# Patient Record
Sex: Female | Born: 1960
Health system: Southern US, Community
[De-identification: ages and names within clinical notes are randomized; demographics above are authoritative.]

## PROBLEM LIST (undated history)

## (undated) DIAGNOSIS — E7800 Pure hypercholesterolemia, unspecified: Secondary | ICD-10-CM

## (undated) DIAGNOSIS — I10 Essential (primary) hypertension: Secondary | ICD-10-CM

## (undated) DIAGNOSIS — E119 Type 2 diabetes mellitus without complications: Secondary | ICD-10-CM

## (undated) DIAGNOSIS — K219 Gastro-esophageal reflux disease without esophagitis: Secondary | ICD-10-CM

## (undated) HISTORY — DX: Type 2 diabetes mellitus without complications: E11.9

---

## 2001-10-31 ENCOUNTER — Emergency Department (HOSPITAL_COMMUNITY): Admission: EM | Admit: 2001-10-31 | Discharge: 2001-10-31 | Payer: Self-pay

## 2011-04-17 LAB — HM MAMMOGRAPHY: HM MAMMO: NORMAL (ref 0–4)

## 2012-03-05 DIAGNOSIS — K5732 Diverticulitis of large intestine without perforation or abscess without bleeding: Secondary | ICD-10-CM

## 2012-03-05 HISTORY — DX: Diverticulitis of large intestine without perforation or abscess without bleeding: K57.32

## 2012-07-08 HISTORY — PX: COLONOSCOPY: SHX174

## 2013-04-16 LAB — HM PAP SMEAR

## 2013-04-16 LAB — HM COLONOSCOPY

## 2013-06-23 DIAGNOSIS — E7800 Pure hypercholesterolemia, unspecified: Secondary | ICD-10-CM

## 2013-06-23 HISTORY — DX: Pure hypercholesterolemia, unspecified: E78.00

## 2014-05-05 ENCOUNTER — Encounter (HOSPITAL_BASED_OUTPATIENT_CLINIC_OR_DEPARTMENT_OTHER): Payer: Self-pay | Admitting: Emergency Medicine

## 2014-05-05 ENCOUNTER — Emergency Department (HOSPITAL_BASED_OUTPATIENT_CLINIC_OR_DEPARTMENT_OTHER): Payer: 59

## 2014-05-05 ENCOUNTER — Emergency Department (HOSPITAL_BASED_OUTPATIENT_CLINIC_OR_DEPARTMENT_OTHER)
Admission: EM | Admit: 2014-05-05 | Discharge: 2014-05-05 | Disposition: A | Discharge status: Home / Self Care | Payer: 59 | Attending: Emergency Medicine | Admitting: Emergency Medicine

## 2014-05-05 DIAGNOSIS — Z8719 Personal history of other diseases of the digestive system: Secondary | ICD-10-CM | POA: Insufficient documentation

## 2014-05-05 DIAGNOSIS — Z720 Tobacco use: Secondary | ICD-10-CM | POA: Insufficient documentation

## 2014-05-05 DIAGNOSIS — I10 Essential (primary) hypertension: Secondary | ICD-10-CM | POA: Insufficient documentation

## 2014-05-05 DIAGNOSIS — Z8639 Personal history of other endocrine, nutritional and metabolic disease: Secondary | ICD-10-CM | POA: Insufficient documentation

## 2014-05-05 DIAGNOSIS — R61 Generalized hyperhidrosis: Secondary | ICD-10-CM | POA: Insufficient documentation

## 2014-05-05 DIAGNOSIS — N200 Calculus of kidney: Secondary | ICD-10-CM | POA: Insufficient documentation

## 2014-05-05 DIAGNOSIS — R1032 Left lower quadrant pain: Secondary | ICD-10-CM | POA: Diagnosis present

## 2014-05-05 DIAGNOSIS — R109 Unspecified abdominal pain: Secondary | ICD-10-CM

## 2014-05-05 HISTORY — DX: Gastro-esophageal reflux disease without esophagitis: K21.9

## 2014-05-05 HISTORY — DX: Essential (primary) hypertension: I10

## 2014-05-05 HISTORY — DX: Pure hypercholesterolemia, unspecified: E78.00

## 2014-05-05 LAB — BASIC METABOLIC PANEL
Anion gap: 15 (ref 5–15)
BUN: 11 mg/dL (ref 6–23)
CHLORIDE: 105 meq/L (ref 96–112)
CO2: 25 mEq/L (ref 19–32)
CREATININE: 1.1 mg/dL (ref 0.50–1.10)
Calcium: 9.4 mg/dL (ref 8.4–10.5)
GFR calc non Af Amer: 56 mL/min — ABNORMAL LOW (ref >90)
GFR, EST AFRICAN AMERICAN: 65 mL/min — AB (ref >90)
Glucose, Bld: 115 mg/dL — ABNORMAL HIGH (ref 70–99)
Potassium: 3.5 mEq/L — ABNORMAL LOW (ref 3.7–5.3)
Sodium: 145 mEq/L (ref 137–147)

## 2014-05-05 LAB — CBC WITH DIFFERENTIAL/PLATELET
Basophils Absolute: 0.1 10*3/uL (ref 0.0–0.1)
Basophils Relative: 1 % (ref 0–1)
EOS PCT: 0 % (ref 0–5)
Eosinophils Absolute: 0 10*3/uL (ref 0.0–0.7)
HCT: 40.8 % (ref 36.0–46.0)
Hemoglobin: 13.4 g/dL (ref 12.0–15.0)
LYMPHS ABS: 6.9 10*3/uL — AB (ref 0.7–4.0)
Lymphocytes Relative: 70 % — ABNORMAL HIGH (ref 12–46)
MCH: 31.5 pg (ref 26.0–34.0)
MCHC: 32.8 g/dL (ref 30.0–36.0)
MCV: 96 fL (ref 78.0–100.0)
Monocytes Absolute: 0.3 10*3/uL (ref 0.1–1.0)
Monocytes Relative: 3 % (ref 3–12)
Neutro Abs: 2.6 10*3/uL (ref 1.7–7.7)
Neutrophils Relative %: 26 % — ABNORMAL LOW (ref 43–77)
PLATELETS: 240 10*3/uL (ref 150–400)
RBC: 4.25 MIL/uL (ref 3.87–5.11)
RDW: 13.1 % (ref 11.5–15.5)
WBC: 9.9 10*3/uL (ref 4.0–10.5)

## 2014-05-05 MED ORDER — ONDANSETRON HCL 4 MG/2ML IJ SOLN
4.0000 mg | Freq: Once | INTRAMUSCULAR | Status: AC
  Administered 2014-05-05: 4 mg via INTRAVENOUS

## 2014-05-05 MED ORDER — ONDANSETRON HCL 4 MG/2ML IJ SOLN
INTRAMUSCULAR | Status: AC
  Filled 2014-05-05: qty 2

## 2014-05-05 MED ORDER — MORPHINE SULFATE 4 MG/ML IJ SOLN
4.0000 mg | Freq: Once | INTRAMUSCULAR | Status: AC
  Administered 2014-05-05: 4 mg via INTRAVENOUS
  Filled 2014-05-05: qty 1

## 2014-05-05 MED ORDER — KETOROLAC TROMETHAMINE 30 MG/ML IJ SOLN
30.0000 mg | Freq: Once | INTRAMUSCULAR | Status: AC
  Administered 2014-05-05: 30 mg via INTRAVENOUS
  Filled 2014-05-05: qty 1

## 2014-05-05 MED ORDER — OXYCODONE-ACETAMINOPHEN 5-325 MG PO TABS: 1.0000 | ORAL_TABLET | Freq: Four times a day (QID) | ORAL | Status: DC | PRN

## 2014-05-05 NOTE — ED Notes (Signed)
Sudden onset left abd pain with nausea, sweating- vomiting during triage

## 2014-05-05 NOTE — ED Notes (Signed)
Pt actively vomiting-unable to complete entire triage info at this time

## 2014-05-05 NOTE — Discharge Instructions (Signed)
Percocet as prescribed as needed for pain.  Strain your urine.  Follow-up with urology if you have not past the stone in the next 2-3 days, and return to the ER if you develop fever, worsening pain, or vomiting with an inability to take your medications.   Kidney Stones Kidney stones (urolithiasis) are deposits that form inside your kidneys. The intense pain is caused by the stone moving through the urinary tract. When the stone moves, the ureter goes into spasm around the stone. The stone is usually passed in the urine.  CAUSES   A disorder that makes certain neck glands produce too much parathyroid hormone (primary hyperparathyroidism).  A buildup of uric acid crystals, similar to gout in your joints.  Narrowing (stricture) of the ureter.  A kidney obstruction present at birth (congenital obstruction).  Previous surgery on the kidney or ureters.  Numerous kidney infections. SYMPTOMS   Feeling sick to your stomach (nauseous).  Throwing up (vomiting).  Blood in the urine (hematuria).  Pain that usually spreads (radiates) to the groin.  Frequency or urgency of urination. DIAGNOSIS   Taking a history and physical exam.  Blood or urine tests.  CT scan.  Occasionally, an examination of the inside of the urinary bladder (cystoscopy) is performed. TREATMENT   Observation.  Increasing your fluid intake.  Extracorporeal shock wave lithotripsy--This is a noninvasive procedure that uses shock waves to break up kidney stones.  Surgery may be needed if you have severe pain or persistent obstruction. There are various surgical procedures. Most of the procedures are performed with the use of small instruments. Only small incisions are needed to accommodate these instruments, so recovery time is minimized. The size, location, and chemical composition are all important variables that will determine the proper choice of action for you. Talk to your health care provider to better  understand your situation so that you will minimize the risk of injury to yourself and your kidney.  HOME CARE INSTRUCTIONS   Drink enough water and fluids to keep your urine clear or pale yellow. This will help you to pass the stone or stone fragments.  Strain all urine through the provided strainer. Keep all particulate matter and stones for your health care provider to see. The stone causing the pain may be as small as a grain of salt. It is very important to use the strainer each and every time you pass your urine. The collection of your stone will allow your health care provider to analyze it and verify that a stone has actually passed. The stone analysis will often identify what you can do to reduce the incidence of recurrences.  Only take over-the-counter or prescription medicines for pain, discomfort, or fever as directed by your health care provider.  Make a follow-up appointment with your health care provider as directed.  Get follow-up X-rays if required. The absence of pain does not always mean that the stone has passed. It may have only stopped moving. If the urine remains completely obstructed, it can cause loss of kidney function or even complete destruction of the kidney. It is your responsibility to make sure X-rays and follow-ups are completed. Ultrasounds of the kidney can show blockages and the status of the kidney. Ultrasounds are not associated with any radiation and can be performed easily in a matter of minutes. SEEK MEDICAL CARE IF:  You experience pain that is progressive and unresponsive to any pain medicine you have been prescribed. SEEK IMMEDIATE MEDICAL CARE IF:   Pain  cannot be controlled with the prescribed medicine.  You have a fever or shaking chills.  The severity or intensity of pain increases over 18 hours and is not relieved by pain medicine.  You develop a new onset of abdominal pain.  You feel faint or pass out.  You are unable to urinate. MAKE SURE  YOU:   Understand these instructions.  Will watch your condition.  Will get help right away if you are not doing well or get worse. Document Released: 06/24/2005 Document Revised: 02/24/2013 Document Reviewed: 11/25/2012 [facility] Patient Information 2015 [employer] [place]. This information is not intended to replace advice given to you by your health care provider. Make sure you discuss any questions you have with your health care provider.

## 2014-05-05 NOTE — ED Notes (Signed)
Patient transported to CT 

## 2014-05-05 NOTE — ED Notes (Signed)
MD at bedside. 

## 2014-05-05 NOTE — ED Provider Notes (Signed)
CSN: [id number]     Arrival date & time 05/05/14  1225 History   First MD Initiated Contact with Patient 05/05/14 1252     Chief Complaint  Patient presents with  . Abdominal Pain     (Consider location/radiation/quality/duration/timing/severity/associated sxs/prior Treatment) HPI Comments: Patient is a 53 year old female otherwise healthy who presents with complaints of sudden onset of left flank pain. This started approximately 3 hours prior to presentation. Her pain radiates into her left flank and is associated with nausea and vomiting. She denies any history of prior kidney stones. Her only prior surgical history is C-section many years ago.  Patient is a 53 y.o. female presenting with abdominal pain. The history is provided by the patient.  Abdominal Pain Pain location:  L flank and LLQ Pain quality: stabbing   Pain radiates to:  Does not radiate Pain severity:  Severe Onset quality:  Sudden Duration:  2 hours Timing:  Constant Progression:  Worsening Chronicity:  New Context: not sick contacts and not trauma   Relieved by:  Nothing Worsened by:  Nothing tried Ineffective treatments:  Acetaminophen Associated symptoms: nausea and vomiting     Past Medical History  Diagnosis Date  . Hypertension   . High cholesterol   . Acid reflux    Past Surgical History  Procedure Laterality Date  . Cesarean section     No family history on file. History  Substance Use Topics  . Smoking status: Current Every Day Smoker    Types: Cigarettes  . Smokeless tobacco: Not on file  . Alcohol Use: Not on file   OB History   Grav Para Term Preterm Abortions TAB SAB Ect Mult Living                 Review of Systems  Gastrointestinal: Positive for nausea, vomiting and abdominal pain.  All other systems reviewed and are negative.     Allergies  Review of patient's allergies indicates no known allergies.  Home Medications   Prior to Admission medications   Not on File    BP 188/110  Pulse 88  Temp(Src) 97.8 F (36.6 C) (Oral)  Resp 22  Ht 5\' 7"  (1.702 m)  Wt 180 lb (81.647 kg)  BMI 28.19 kg/m2  SpO2 100% Physical Exam  Nursing note and vitals reviewed. Constitutional: She is oriented to person, place, and time. She appears well-developed and well-nourished. No distress.  Patient appears somewhat diaphoretic and uncomfortable.  HENT:  Head: Normocephalic and atraumatic.  Neck: Normal range of motion. Neck supple.  Cardiovascular: Normal rate and regular rhythm.  Exam reveals no gallop and no friction rub.   No murmur heard. Pulmonary/Chest: Effort normal and breath sounds normal. No respiratory distress. She has no wheezes.  Abdominal: Soft. Bowel sounds are normal. She exhibits no distension. There is tenderness. There is no rebound and no guarding.  There is tenderness to palpation in the left lower quadrant and left flank.  Musculoskeletal: Normal range of motion.  Neurological: She is alert and oriented to person, place, and time.  Skin: Skin is warm. She is diaphoretic.    ED Course  Procedures (including critical care time) Labs Review Labs Reviewed - No data to display  Imaging Review No results found.   EKG Interpretation None      MDM   Final diagnoses:  Left flank pain    Patient presents with acute onset of severe left flank pain. CT scan confirms a 3.5 mm stone within the mid ureter.  She is feeling better with pain meds given in the ER. She will be discharged with Percocet and follow-up with urology if not passing the stone in the next 2-3 days.    Geoffery Lyonsouglas Kimmberly Wisser, MD 05/05/14 1426

## 2015-01-24 LAB — HEPATIC FUNCTION PANEL
ALT: 11 U/L (ref 7–35)
AST: 15 U/L (ref 13–35)
Alkaline Phosphatase: 74 U/L (ref 25–125)
BILIRUBIN, TOTAL: 0.6 mg/dL

## 2015-01-24 LAB — LIPID PANEL
Cholesterol: 198 mg/dL (ref 0–200)
HDL: 44 mg/dL (ref 35–70)
LDL Cholesterol: 125 mg/dL
Triglycerides: 143 mg/dL (ref 40–160)

## 2015-01-24 LAB — BASIC METABOLIC PANEL
BUN: 12 mg/dL (ref 4–21)
CREATININE: 1 mg/dL (ref 0.5–1.1)
GLUCOSE: 93 mg/dL
POTASSIUM: 4.3 mmol/L (ref 3.4–5.3)
SODIUM: 141 mmol/L (ref 137–147)

## 2015-08-02 LAB — LIPID PANEL
CHOLESTEROL: 196 mg/dL (ref 0–200)
HDL: 45 mg/dL (ref 35–70)
TRIGLYCERIDES: 175 mg/dL — AB (ref 40–160)

## 2015-09-08 LAB — BASIC METABOLIC PANEL
BUN: 10 mg/dL (ref 4–21)
Creatinine: 1 mg/dL (ref 0.5–1.1)
Glucose: 96 mg/dL
Potassium: 4 mmol/L (ref 3.4–5.3)
Sodium: 141 mmol/L (ref 137–147)

## 2016-02-15 ENCOUNTER — Telehealth: Payer: Self-pay

## 2016-02-15 NOTE — Telephone Encounter (Signed)
Pt called in because she says that she was admitted into the hospital. She says that she was discharged yesterday. Pt is scheduled to establish care with provider on 9/13. Pt would like to be advise, is provider able to see her sooner or should she complete Hospital FU with old provider ?   Please advise.

## 2016-02-15 NOTE — Telephone Encounter (Signed)
Scheduled pt for Monday 02/19/16 at 1:15 p.

## 2016-02-15 NOTE — Telephone Encounter (Signed)
Probably should be seen in the next 1 week. Could you please try to get her in sooner with Dr. Dallas Schimkeopeland?

## 2016-02-19 ENCOUNTER — Ambulatory Visit (INDEPENDENT_AMBULATORY_CARE_PROVIDER_SITE_OTHER): Payer: 59 | Admitting: Family Medicine

## 2016-02-19 ENCOUNTER — Encounter: Payer: Self-pay | Admitting: Family Medicine

## 2016-02-19 VITALS — BP 124/78 | HR 83 | Temp 98.0°F | Ht 67.0 in | Wt 173.8 lb

## 2016-02-19 DIAGNOSIS — E785 Hyperlipidemia, unspecified: Secondary | ICD-10-CM | POA: Insufficient documentation

## 2016-02-19 DIAGNOSIS — I10 Essential (primary) hypertension: Secondary | ICD-10-CM | POA: Diagnosis not present

## 2016-02-19 DIAGNOSIS — J209 Acute bronchitis, unspecified: Secondary | ICD-10-CM | POA: Diagnosis not present

## 2016-02-19 DIAGNOSIS — F17200 Nicotine dependence, unspecified, uncomplicated: Secondary | ICD-10-CM

## 2016-02-19 HISTORY — DX: Nicotine dependence, unspecified, uncomplicated: F17.200

## 2016-02-19 HISTORY — DX: Hyperlipidemia, unspecified: E78.5

## 2016-02-19 HISTORY — DX: Essential (primary) hypertension: I10

## 2016-02-19 MED ORDER — CEFDINIR 300 MG PO CAPS: 300.0000 mg | ORAL_CAPSULE | Freq: Two times a day (BID) | ORAL | 0 refills | Status: DC

## 2016-02-19 NOTE — Progress Notes (Signed)
Pre visit review using our clinic review tool, if applicable. No additional management support is needed unless otherwise documented below in the visit note. 

## 2016-02-19 NOTE — Progress Notes (Signed)
[facility] [address], [address] [place], [place] [id number] [phone] [phone]  Date:  02/19/2016   Name:  Margaret CongressBrenda L Haupt   DOB:  26-Aug-1960   MRN:  [id number]  PCP:  Abbe AmsterdamOPLAND,Judyth Demarais, MD    Chief Complaint: Establish Care ([facility] for chest pain, nausea, right side neck pain and sob. Pt states that she still has occ tightness in her chest. )   History of Present Illness:  Margaret CongressBrenda L Molitor is a 55 y.o. very pleasant female patient who presents with the following:  Here today as a new patient.  [facility] with chest pain.  She was monitored overnight, cycled troponins and had labs, am stress test. Troponins remained negative, stress test negative, CXR negative.  She was released to home and her sx were thought due to a viral illness.   She notes that she still has a sore throat, mild cough, chest and also congestion.  She had these sx when she went to the ER as well- she has had them for about a week overall now  She has checked her temp and did have a temp of 99 She will feel hot, no body aches except a crick in her neck.    She notes that "every once in while I will feel tight in my chest" if she moves her torso. She does not have any exertional SOB however.  No longer having CP No vomiting She has a history of HTN, high cholesterol- never had any CAD, no PE  Her father did die of an MI at age 56  She takes lisinopril 10, atorvastatin 40 and protonix 40  BP Readings from Last 3 Encounters:  02/19/16 124/78  05/05/14 162/92   She was not treated with abx in the hospital She is a smoker  There are no active problems to display for this patient.   Past Medical History:  Diagnosis Date  . Acid reflux   . High cholesterol   . Hypertension     Past Surgical History:  Procedure Laterality Date  . CESAREAN SECTION      Social History  Substance Use Topics  .  Smoking status: Current Every Day Smoker    Types: Cigarettes  . Smokeless tobacco: Not on file  . Alcohol use Yes    No family history on file.  No Known Allergies  Medication list has been reviewed and updated.  Current Outpatient Prescriptions on File Prior to Visit  Medication Sig Dispense Refill  . aspirin 81 MG tablet Take 81 mg by mouth daily.    . Atorvastatin Calcium (LIPITOR PO) Take by mouth.    Marland Kitchen. LISINOPRIL PO Take by mouth.    . Omeprazole (PRILOSEC PO) Take by mouth.    . oxyCODONE-acetaminophen (PERCOCET) 5-325 MG per tablet Take 1-2 tablets by mouth every 6 (six) hours as needed. 25 tablet 0   No current facility-administered medications on file prior to visit.     Review of Systems:  As per HPI- otherwise negative.   Physical Examination: Vitals:   02/19/16 1328  BP: 124/78  Pulse: 83  Temp: 98 F (36.7 C)   Vitals:   02/19/16 1328  Weight: 173 lb 12.8 oz (78.8 kg)  Height: 5\' 7"  (1.702 m)   Body mass index is 27.22 kg/m. Ideal Body Weight: Weight in (lb) to have BMI = 25: 159.3  GEN: WDWN, NAD, Non-toxic, A & O x 3, mild overweight, looks well HEENT: Atraumatic, Normocephalic. Neck supple. No masses, No LAD. Bilateral TM wnl, oropharynx normal.  PEERL,EOMI.   Ears and Nose: No external deformity. CV: RRR, No M/G/R. No JVD. No thrill. No extra heart sounds. I am able to reproduce her CP by pressing on her chest wall PULM: CTA B, no wheezes, crackles, rhonchi. No retractions. No resp. distress. No accessory muscle use. ABD: S, NT, ND EXTR: No c/c/e NEURO Normal gait.  PSYCH: Normally interactive. Conversant. Not depressed or anxious appearing.  Calm demeanor.   Per most recent labs on care everywhere her last creat clearance was 62 Noted mild renal insuf with creat about 1.1 off an on over the last few years.  CBC, CMP otherwise normal   Assessment and Plan: Essential hypertension  Dyslipidemia  Tobacco use disorder  Acute bronchitis,  unspecified organism   Treat with omnicef for one week of cough and sinus congestion, low grade temp at home Her BP is controlled She would like to be our primary pt at this time- we will plan to meet for a CPE in November  Meds ordered this encounter  Medications  . cefdinir (OMNICEF) 300 MG capsule    Sig: Take 1 capsule (300 mg total) by mouth 2 (two) times daily.    Dispense:  20 capsule    Refill:  0  . DISCONTD: lisinopril (PRINIVIL,ZESTRIL) 40 MG tablet    Sig: Take 40 mg by mouth daily.  Marland Kitchen. atorvastatin (LIPITOR) 40 MG tablet    Sig: Take 40 mg by mouth daily.  Marland Kitchen. omeprazole (PRILOSEC) 40 MG capsule    Sig: Take 40 mg by mouth daily.  Marland Kitchen. lisinopril (PRINIVIL,ZESTRIL) 10 MG tablet    Sig: Take 10 mg by mouth daily.   It was very nice to see you today. Use the antibiotic as directed for any chest and/ or sinus infection.   Let me know if you do not feel better in a few days- Sooner if worse.  Please come and see me this fall for FASTING labs and a recheck.  Take care!  Let me know if you have any other episodes of chest pain or other concerns  Quitting smoking would be a great idea!!   Signed Abbe AmsterdamJessica Stepahnie Campo, MD

## 2016-02-19 NOTE — Patient Instructions (Addendum)
It was very nice to see you today. Use the antibiotic as directed for any chest and/ or sinus infection.   Let me know if you do not feel better in a few days- Sooner if worse.  Please come and see me this fall for FASTING labs and a recheck.  Take care!  Let me know if you have any other episodes of chest pain or other concerns  Quitting smoking would be a great idea!!

## 2016-03-14 ENCOUNTER — Telehealth: Payer: Self-pay | Admitting: Family Medicine

## 2016-03-14 NOTE — Telephone Encounter (Signed)
[facility]  Will abstract into chart . No shot records noted She did have trace blood on a urine dip only- ?was this followed up?  Will send a letter to pt

## 2016-03-20 ENCOUNTER — Ambulatory Visit: Payer: TRICARE For Life (TFL) | Admitting: Family Medicine

## 2016-04-04 ENCOUNTER — Other Ambulatory Visit: Payer: Self-pay | Admitting: Family Medicine

## 2016-04-04 NOTE — Progress Notes (Signed)
09/08/2015 

## 2016-04-15 ENCOUNTER — Telehealth: Payer: Self-pay | Admitting: Family Medicine

## 2016-04-15 NOTE — Telephone Encounter (Signed)
Pt called in to follow up, she received a letter from pcp . She says that she did follow up with provider, they had no additional concerns. She says that she is still have frequent urination and some back pain. She would like a call back to discuss further.

## 2016-04-15 NOTE — Telephone Encounter (Signed)
Called her back- she is having back pain and urinary frequency.  She needs to be seen- no fever however.  Will have Tanesha call her in the am and schedule an appt asap Her cell is [phone]

## 2016-04-16 ENCOUNTER — Encounter: Payer: Self-pay | Admitting: Family

## 2016-04-16 ENCOUNTER — Telehealth: Payer: Self-pay | Admitting: Family

## 2016-04-16 ENCOUNTER — Telehealth: Payer: Self-pay | Admitting: *Deleted

## 2016-04-16 ENCOUNTER — Ambulatory Visit (INDEPENDENT_AMBULATORY_CARE_PROVIDER_SITE_OTHER): Payer: 59 | Admitting: Family

## 2016-04-16 VITALS — BP 138/88 | HR 79 | Temp 98.3°F | Ht 67.0 in | Wt 175.4 lb

## 2016-04-16 DIAGNOSIS — N3941 Urge incontinence: Secondary | ICD-10-CM

## 2016-04-16 DIAGNOSIS — M545 Low back pain, unspecified: Secondary | ICD-10-CM

## 2016-04-16 DIAGNOSIS — E119 Type 2 diabetes mellitus without complications: Secondary | ICD-10-CM | POA: Diagnosis not present

## 2016-04-16 DIAGNOSIS — R81 Glycosuria: Secondary | ICD-10-CM | POA: Diagnosis not present

## 2016-04-16 HISTORY — DX: Type 2 diabetes mellitus without complications: E11.9

## 2016-04-16 LAB — BASIC METABOLIC PANEL
BUN: 8 mg/dL (ref 6–23)
CALCIUM: 9.4 mg/dL (ref 8.4–10.5)
CO2: 30 meq/L (ref 19–32)
Chloride: 107 mEq/L (ref 96–112)
Creatinine, Ser: 1.15 mg/dL (ref 0.40–1.20)
GFR: 62.7 mL/min (ref >60.00)
Glucose, Bld: 90 mg/dL (ref 70–99)
Potassium: 4.1 mEq/L (ref 3.5–5.1)
Sodium: 141 mEq/L (ref 135–145)

## 2016-04-16 LAB — POC URINALSYSI DIPSTICK (AUTOMATED)
Bilirubin, UA: NEGATIVE
Ketones, UA: NEGATIVE
Leukocytes, UA: NEGATIVE
NITRITE UA: NEGATIVE
PH UA: 6
RBC UA: NEGATIVE
Spec Grav, UA: >=1.03
UROBILINOGEN UA: 2

## 2016-04-16 LAB — HEMOGLOBIN A1C: HEMOGLOBIN A1C: 6.5 % (ref 4.6–6.5)

## 2016-04-16 LAB — GLUCOSE, POCT (MANUAL RESULT ENTRY): POC GLUCOSE: 103 mg/dL — AB (ref 70–99)

## 2016-04-16 MED ORDER — CYCLOBENZAPRINE HCL 5 MG PO TABS: 5.0000 mg | ORAL_TABLET | Freq: Three times a day (TID) | ORAL | 0 refills | Status: DC | PRN

## 2016-04-16 MED ORDER — LIDOCAINE 5 % EX PTCH: 1.0000 | MEDICATED_PATCH | CUTANEOUS | 0 refills | Status: DC

## 2016-04-16 MED ORDER — MELOXICAM 7.5 MG PO TABS: 7.5000 mg | ORAL_TABLET | Freq: Every day | ORAL | 0 refills | Status: DC

## 2016-04-16 NOTE — Telephone Encounter (Signed)
Called pt this morning to get her an appt for today. Pt informed me that her sx's had worsened and that she had already called to schedule an appt with Melissa this morning.

## 2016-04-16 NOTE — Progress Notes (Signed)
Pre visit review using our clinic review tool, if applicable. No additional management support is needed unless otherwise documented below in the visit note. 

## 2016-04-16 NOTE — Patient Instructions (Addendum)
Please complete lab work prior to leaving. Please go to the ER if you develop incontinence of stool. Limit your carbs until we have more information about your sugars. For back pain- begin meloxicam and you may also apply a lidocaine patch. Flexeril (muscle relaxer) can be used as needed but it may cause drowsiness.

## 2016-04-16 NOTE — Telephone Encounter (Signed)
Received notice of PA for meloxicam. PA completed via covermymeds and awaiting determination. Pt is aware.

## 2016-04-16 NOTE — Telephone Encounter (Signed)
-----   Message from Pearline CablesJessica C Copland, MD sent at 04/15/2016  5:58 PM EDT ----- Please call her- she needs an appt for probably UTI.  She said Wednesday was ok but it might be best to have her seen tomorrow before she gets worse!  If she is willing schedule for tomorrow 10/10 Her cell is [phone]  Thank you!

## 2016-04-16 NOTE — Telephone Encounter (Signed)
Ok great.

## 2016-04-16 NOTE — Progress Notes (Signed)
Subjective:    Patient ID: Margaret Sims, female    DOB: 03/01/1961, 55 y.o.   MRN: [id number]  HPI  Margaret Sims is a 55 yr old female who presents today with two concerns:  Back pain- began last Monday. Seemed to get better until this around Saturday until  yesterday it was "back and it was worse." Pain is located in the lower back.  Seems more on her left side. Pain is worse with standing straight, feels better to lean forward when she walks.  Pain is non-radiating. She denies lower extremity numbness or weakness.    She also reports urinary frequency which began on Thursday of last week.  Had frequency Thursday and Friday. She then developed urge incontinence yesterday. She denies stool incontinence.   Review of Systems See HPI    Past Medical History:  Diagnosis Date  . Acid reflux   . High cholesterol   . Hypertension      Social History   Social History  . Marital status: Married    Spouse name: N/A  . Number of children: N/A  . Years of education: N/A   Occupational History  . Not on file.   Social History Main Topics  . Smoking status: Current Every Day Smoker    Types: Cigarettes  . Smokeless tobacco: Not on file  . Alcohol use Yes  . Drug use: Unknown  . Sexual activity: Yes    Birth control/ protection: Post-menopausal   Other Topics Concern  . Not on file   Social History Narrative  . No narrative on file    Past Surgical History:  Procedure Laterality Date  . CESAREAN SECTION      No family history on file.  No Known Allergies  Current Outpatient Prescriptions on File Prior to Visit  Medication Sig Dispense Refill  . aspirin 81 MG tablet Take 81 mg by mouth daily.    Marland Kitchen atorvastatin (LIPITOR) 40 MG tablet Take 40 mg by mouth daily.    Marland Kitchen lisinopril (PRINIVIL,ZESTRIL) 10 MG tablet Take 10 mg by mouth daily.    Marland Kitchen omeprazole (PRILOSEC) 40 MG capsule Take 40 mg by mouth daily.    . cefdinir (OMNICEF) 300 MG capsule Take 1 capsule (300 mg  total) by mouth 2 (two) times daily. (Patient not taking: Reported on 04/16/2016) 20 capsule 0   No current facility-administered medications on file prior to visit.     BP 138/88 (BP Location: Left Arm, Patient Position: Sitting, Cuff Size: Large)   Pulse 79   Temp 98.3 F (36.8 C) (Oral)   Ht 5\' 7"  (1.702 m)   Wt 175 lb 6.4 oz (79.6 kg)   SpO2 98%   BMI 27.47 kg/m    Objective:   Physical Exam  Constitutional: She is oriented to person, place, and time. She appears well-developed and well-nourished.  HENT:  Head: Normocephalic and atraumatic.  Cardiovascular: Normal rate, regular rhythm and normal heart sounds.   No murmur heard. Pulmonary/Chest: Effort normal and breath sounds normal. No respiratory distress. She has no wheezes.  Abdominal: Soft. Bowel sounds are normal. There is no CVA tenderness.  Musculoskeletal: She exhibits no edema.  Mild tenderness to palpation left lower back  Neurological: She is alert and oriented to person, place, and time.  Reflex Scores:      Patellar reflexes are 2+ on the right side and 2+ on the left side. Bilateral LE strength is 5/5  Skin: Skin is warm and dry.  Psychiatric: She has a normal mood and affect. Her behavior is normal. Judgment and thought content normal.          Assessment & Plan:  Musculoskeletal low back pain- Will rx with meloxicam, topical lidoderm patch and as needed flexeril. She is cautioned that flexeril may cause drowsiness.   Urge incontinence-  UA unremarkable except for glucose- see below. Will send urine for culture to exclude UTI.  I do not believe that there is a neurologic component to her incontinence, however I did advise patient to go to there ER if she has incontinence of stool or numbness/weakness in her legs. She verbalizes understanding.   DM2- A1C is obtained today and confirms DM2.  This is a new finding for her (see phone note).  Glucosuria is likely contributing to her urinary frequency.   Advised patient on diabetic diet.   Lab Results  Component Value Date   HGBA1C 6.5 04/16/2016   Case was reviewed with Dr. Abner GreenspanBlyth.

## 2016-04-16 NOTE — Telephone Encounter (Signed)
Please let patient know that I reviewed her lab work and it shows diabetes.  It is not at a level where we need to add medication, but she should work on diet- avoid concentrated sweets,  Limit white fluffy carbs, get regular exercise.  I would recommend that she keep her follow up on 10/25 with Dr. Dallas Schimkeopeland.

## 2016-04-17 NOTE — Telephone Encounter (Signed)
Left message for pt to return my call.

## 2016-04-19 NOTE — Telephone Encounter (Signed)
Left message for pt to return my call.

## 2016-04-19 NOTE — Telephone Encounter (Signed)
Received notification from [facility] that meloxicam in on pt's formulary and PA is not needed. Notified pharmacy and they have already filled for pt.

## 2016-04-23 NOTE — Telephone Encounter (Signed)
Letter mailed to pt.  

## 2016-05-01 ENCOUNTER — Encounter: Payer: Self-pay | Admitting: Family Medicine

## 2016-05-01 ENCOUNTER — Ambulatory Visit (INDEPENDENT_AMBULATORY_CARE_PROVIDER_SITE_OTHER): Payer: 59 | Admitting: Family Medicine

## 2016-05-01 VITALS — BP 132/90 | HR 99 | Temp 98.0°F | Ht 67.0 in | Wt 173.8 lb

## 2016-05-01 DIAGNOSIS — E119 Type 2 diabetes mellitus without complications: Secondary | ICD-10-CM | POA: Diagnosis not present

## 2016-05-01 DIAGNOSIS — E7800 Pure hypercholesterolemia, unspecified: Secondary | ICD-10-CM

## 2016-05-01 DIAGNOSIS — M545 Low back pain, unspecified: Secondary | ICD-10-CM

## 2016-05-01 DIAGNOSIS — Z23 Encounter for immunization: Secondary | ICD-10-CM

## 2016-05-01 MED ORDER — ATORVASTATIN CALCIUM 40 MG PO TABS: 40.0000 mg | ORAL_TABLET | Freq: Every day | ORAL | 11 refills | Status: DC

## 2016-05-01 NOTE — Progress Notes (Signed)
Pre visit review using our clinic review tool, if applicable. No additional management support is needed unless otherwise documented below in the visit note. 

## 2016-05-01 NOTE — Progress Notes (Signed)
Margaret Sims [address] [place], [place] [address] [phone]  Date:  05/01/2016   Name:  Margaret Sims   DOB:  06-18-1961   MRN:  [id number]  PCP:  Abbe Amsterdam, MD    Chief Complaint: Follow-up (Pt here for f/u on back pain. Pt states that back pain has improved since last visit but on occ she will still feel pain when bending over or squatting down and then coming back up. )   History of Present Illness:  Margaret Sims is a 55 y.o. very pleasant female patient who presents with the following:  Seen by myself in August- then saw Melissa about 2 weeks ago with complaint of lower back pain- assessment and plan as below from that visit:  Musculoskeletal low back pain- Will rx with meloxicam, topical lidoderm patch and as needed flexeril. She is cautioned that flexeril may cause drowsiness.   Urge incontinence-  UA unremarkable except for glucose- see below. Will send urine for culture to exclude UTI.  I do not believe that there is a neurologic component to her incontinence, however I did advise patient to go to there ER if she has incontinence of stool or numbness/weakness in her legs. She verbalizes understanding.   DM2- A1C is obtained today and confirms DM2.  This is a new finding for her (see phone note).  Glucosuria is likely contributing to her urinary frequency.  Advised patient on diabetic diet.    Today she states that her back is doing "a whole lot better," she will only have sx if she is squatting or bending and then straightens up quickly  Her urinary sx are resolved.  Her bladder control is back to normal  No numbness or weakness in her legs  She was also dx with DM at her last visit- she notes that she has been told that her glucose was high on occasion in the past but never dx with DM.  We discussed this dc  She did have a recent eye exam- all looked ok.   She does smoke about 1/2 PPD  BP  Readings from Last 3 Encounters:  05/01/16 (!) 144/98  04/16/16 138/88  02/19/16 124/78     Lab Results  Component Value Date   HGBA1C 6.5 04/16/2016     Patient Active Problem List   Diagnosis Date Noted  . Diabetes type 2, controlled (HCC) 04/16/2016  . Essential hypertension 02/19/2016  . Dyslipidemia 02/19/2016  . Tobacco use disorder 02/19/2016    Past Medical History:  Diagnosis Date  . Acid reflux   . Diabetes type 2, controlled (HCC) 04/16/2016  . High cholesterol   . Hypertension     Past Surgical History:  Procedure Laterality Date  . CESAREAN SECTION      Social History  Substance Use Topics  . Smoking status: Current Every Day Smoker    Types: Cigarettes  . Smokeless tobacco: Not on file  . Alcohol use Yes    No family history on file.  No Known Allergies  Medication list has been reviewed and updated.  Current Outpatient Prescriptions on File Prior to Visit  Medication Sig Dispense Refill  . aspirin 81 MG tablet Take 81 mg by mouth daily.    Marland Kitchen atorvastatin (LIPITOR) 40 MG tablet Take 40 mg by mouth daily.    . cyclobenzaprine (FLEXERIL) 5 MG tablet Take 1 tablet (5 mg total) by mouth 3 (three) times  daily as needed for muscle spasms. 20 tablet 0  . lisinopril (PRINIVIL,ZESTRIL) 10 MG tablet Take 10 mg by mouth daily.    Marland Kitchen. omeprazole (PRILOSEC) 40 MG capsule Take 40 mg by mouth daily.     No current facility-administered medications on file prior to visit.     Review of Systems:  As per HPI- otherwise negative.   Physical Examination: Vitals:   05/01/16 1641  BP: (!) 136/93  Pulse: 99  Temp: 98 F (36.7 C)   Vitals:   05/01/16 1641  Weight: 173 lb 12.8 oz (78.8 kg)  Height: 5\' 7"  (1.702 m)   Body mass index is 27.22 kg/m. Ideal Body Weight: Weight in (lb) to have BMI = 25: 159.3  GEN: WDWN, NAD, Non-toxic, A & O x 3, looks well, mild overweight HEENT: Atraumatic, Normocephalic. Neck supple. No masses, No LAD. Ears and  Nose: No external deformity. CV: RRR, No M/G/R. No JVD. No thrill. No extra heart sounds. PULM: CTA B, no wheezes, crackles, rhonchi. No retractions. No resp. distress. No accessory muscle use. EXTR: No c/c/e NEURO Normal gait.  PSYCH: Normally interactive. Conversant. Not depressed or anxious appearing.  Calm demeanor.  No lumbar tenderness, normal flexion and ex. Normal BLE strength, sensation and DTR   Assessment and Plan: Controlled type 2 diabetes mellitus without complication, without long-term current use of insulin (HCC) - Plan: Pneumococcal polysaccharide vaccine 23-valent greater than or equal to 2yo subcutaneous/IM  Immunization due - Plan: Pneumococcal polysaccharide vaccine 23-valent greater than or equal to 2yo subcutaneous/IM  Pure hypercholesterolemia - Plan: atorvastatin (LIPITOR) 40 MG tablet  Acute left-sided low back pain without sciatica   Here today to recheck her back pain.  Her sx are basically resolved- she will let me know if sx return Discussed her DM, medication not necessary at this time. She will work on diet and exercise, encouraged her to stop smoking (she is thinking about this), recheck in 4 months immunizations updated today  Signed Abbe AmsterdamJessica Ahmaud Duthie, MD

## 2016-05-01 NOTE — Patient Instructions (Addendum)
You got your pneumonia and flu vaccines today. I refilled your cholesterol med Please work on exercise/ being more active in general.  Also, try to decrease your carbohydrate and sweets intake.   Please see me in about 4 months to check on your progress  Please think hard about quitting smoking- it is especially dangerous for diabetics to smoke; your risk of heart disease and stroke is increased!

## 2016-05-29 ENCOUNTER — Ambulatory Visit: Payer: TRICARE For Life (TFL) | Admitting: Family Medicine

## 2016-06-05 ENCOUNTER — Telehealth: Payer: Self-pay | Admitting: Family Medicine

## 2016-06-05 NOTE — Telephone Encounter (Signed)
Plain mucinex would be fine.

## 2016-06-05 NOTE — Telephone Encounter (Signed)
Caller name: Relationship to patient: Self Can be reached: [phone] Pharmacy:  Reason for call: Has a question about what medication she can take OTC for head congestion and she is on BP medication

## 2016-06-05 NOTE — Telephone Encounter (Signed)
Tried to contact pt to inform her that per Dr. Patsy Lageropland it will be fine for her to take plain mucinex for head congestion while using bp med. No answer, left detailed message for pt with this information. Asked pt to call back if she has any additional questions or concerns.

## 2016-06-20 ENCOUNTER — Ambulatory Visit (INDEPENDENT_AMBULATORY_CARE_PROVIDER_SITE_OTHER): Payer: 59 | Admitting: Family Medicine

## 2016-06-20 VITALS — BP 140/90 | HR 78 | Temp 97.9°F | Wt 170.2 lb

## 2016-06-20 DIAGNOSIS — H43393 Other vitreous opacities, bilateral: Secondary | ICD-10-CM

## 2016-06-20 DIAGNOSIS — E785 Hyperlipidemia, unspecified: Secondary | ICD-10-CM | POA: Diagnosis not present

## 2016-06-20 DIAGNOSIS — I10 Essential (primary) hypertension: Secondary | ICD-10-CM

## 2016-06-20 DIAGNOSIS — E119 Type 2 diabetes mellitus without complications: Secondary | ICD-10-CM | POA: Diagnosis not present

## 2016-06-20 LAB — GLUCOSE, POCT (MANUAL RESULT ENTRY): POC GLUCOSE: 110 mg/dL — AB (ref 70–99)

## 2016-06-20 NOTE — Progress Notes (Signed)
Pre visit review using our clinic review tool, if applicable. No additional management support is needed unless otherwise documented below in the visit note. 

## 2016-06-20 NOTE — Patient Instructions (Signed)
Your glucose seems to be ok- 110 today [facility]  [address], [place], [place] [address]  Please head straight there

## 2016-06-20 NOTE — Progress Notes (Signed)
[facility] [place] [address], [address] [place], [place] [address] [phone] [phone]  Date:  06/20/2016   Name:  Margaret Sims   DOB:  1960/10/14   MRN:  [id number]  PCP:  Abbe AmsterdamOPLAND,JESSICA, MD    Chief Complaint: No chief complaint on file.   History of Present Illness:  Margaret Sims is a 55 y.o. very pleasant female patient who presents with the following:  Seen here about 2 months ago recently dx with DM, currently not on medication for same  She does take lisinopril, lipitor and asa 81  She is here today with complaint of "floaters" in her eyes that she has noted for a couple of weeks. They are more prominent in her left eye, some in her right eye as well. They are increasing in frequency and are now there most of the time.  She has not noted any flashing lights She does wear readers but no other corrective lesnses. She is seen at a an optometrist for her eye exam- she was seen about a year ago.   Her eyes can feel "dry," but not painful. She notes a "haze" over her eyes.   In general her vision is about at her baseline currently  She has not noted any "curtain" coming down over her vision and does not have any shadowing of her vision  Called [facility] and they kindly offered to see pt today- she will go straight there   Lab Results  Component Value Date   HGBA1C 6.5 04/16/2016     Patient Active Problem List   Diagnosis Date Noted  . Diabetes type 2, controlled (HCC) 04/16/2016  . Essential hypertension 02/19/2016  . Dyslipidemia 02/19/2016  . Tobacco use disorder 02/19/2016    Past Medical History:  Diagnosis Date  . Acid reflux   . Diabetes type 2, controlled (HCC) 04/16/2016  . High cholesterol   . Hypertension     Past Surgical History:  Procedure Laterality Date  . CESAREAN SECTION      Social History  Substance Use Topics  . Smoking status: Current Every Day Smoker    Types: Cigarettes  .  Smokeless tobacco: Not on file  . Alcohol use Yes    No family history on file.  No Known Allergies  Medication list has been reviewed and updated.  Current Outpatient Prescriptions on File Prior to Visit  Medication Sig Dispense Refill  . aspirin 81 MG tablet Take 81 mg by mouth daily.    Marland Kitchen. atorvastatin (LIPITOR) 40 MG tablet Take 1 tablet (40 mg total) by mouth daily. 30 tablet 11  . cyclobenzaprine (FLEXERIL) 5 MG tablet Take 1 tablet (5 mg total) by mouth 3 (three) times daily as needed for muscle spasms. 20 tablet 0  . lisinopril (PRINIVIL,ZESTRIL) 10 MG tablet Take 10 mg by mouth daily.    Marland Kitchen. omeprazole (PRILOSEC) 40 MG capsule Take 40 mg by mouth daily.     No current facility-administered medications on file prior to visit.     Review of Systems:  As per HPI- otherwise negative. She otherwise feels well, no fever, chills, nausea, vomiting   Physical Examination: Blood pressure (!) 151/94, pulse 78, temperature 97.9 F (36.6 C), temperature source Oral, weight 170 lb 3.2 oz (77.2 kg), SpO2 100 %. Body mass index is 26.66 kg/m.    GEN: WDWN, NAD, Non-toxic, A & O x 3, looks well, minimal overweight HEENT: Atraumatic, Normocephalic. Neck  supple. No masses, No LAD.  Bilateral TM wnl, oropharynx normal.  PEERL,EOMI.   Limited fundoscopic exam wno Ears and Nose: No external deformity. CV: RRR, No M/G/R. No JVD. No thrill. No extra heart sounds. PULM: CTA B, no wheezes, crackles, rhonchi. No retractions. No resp. distress. No accessory muscle use. ABD: S, NT, ND, +BS. No rebound. No HSM. EXTR: No c/c/e NEURO Normal gait.  PSYCH: Normally interactive. Conversant. Not depressed or anxious appearing.  Calm demeanor.   Glucose 110 today Assessment and Plan: Visual floaters, bilateral  Controlled type 2 diabetes mellitus without complication, without long-term current use of insulin (HCC) - Plan: POCT glucose (manual entry)  Essential hypertension  Dyslipidemia  Here  today with new floaters for a couple of weeks- to see optho today, [facility] Her BP is under control today Recent A1c at goal, fingerstick today is fine Continue cholesterol med    Signed Abbe AmsterdamJessica Copland, MD

## 2016-06-26 ENCOUNTER — Telehealth: Payer: Self-pay | Admitting: Family Medicine

## 2016-06-26 ENCOUNTER — Other Ambulatory Visit: Payer: Self-pay | Admitting: Emergency Medicine

## 2016-06-26 MED ORDER — OMEPRAZOLE 40 MG PO CPDR: 40.0000 mg | DELAYED_RELEASE_CAPSULE | Freq: Every day | ORAL | 1 refills | Status: DC

## 2016-06-26 MED ORDER — LISINOPRIL 10 MG PO TABS: 10.0000 mg | ORAL_TABLET | Freq: Every day | ORAL | 1 refills | Status: DC

## 2016-06-26 NOTE — Telephone Encounter (Signed)
Tried to contact pt to inform that refills have been sent to [facility] as requested. No answer, left message with this information for pt.

## 2016-06-26 NOTE — Telephone Encounter (Signed)
Caller name:Jisselle Sockwell  Relationship to patient:self Can be reached:[phone] Pharmacy:[facility]  Reason for call:Requesting refill on omeprazole 40mg  and lisinopril 10mg , request a call once sent at number listed above

## 2016-09-04 ENCOUNTER — Ambulatory Visit: Payer: TRICARE For Life (TFL) | Admitting: Family Medicine

## 2016-09-11 ENCOUNTER — Ambulatory Visit (INDEPENDENT_AMBULATORY_CARE_PROVIDER_SITE_OTHER): Payer: 59 | Admitting: Family Medicine

## 2016-09-11 VITALS — BP 130/78 | HR 88 | Temp 98.8°F | Ht 67.0 in | Wt 169.8 lb

## 2016-09-11 DIAGNOSIS — E785 Hyperlipidemia, unspecified: Secondary | ICD-10-CM | POA: Diagnosis not present

## 2016-09-11 DIAGNOSIS — I10 Essential (primary) hypertension: Secondary | ICD-10-CM

## 2016-09-11 DIAGNOSIS — Z23 Encounter for immunization: Secondary | ICD-10-CM | POA: Diagnosis not present

## 2016-09-11 DIAGNOSIS — H43393 Other vitreous opacities, bilateral: Secondary | ICD-10-CM

## 2016-09-11 DIAGNOSIS — Z1159 Encounter for screening for other viral diseases: Secondary | ICD-10-CM

## 2016-09-11 DIAGNOSIS — Z130 Encounter for screening for diseases of the blood and blood-forming organs and certain disorders involving the immune mechanism: Secondary | ICD-10-CM | POA: Diagnosis not present

## 2016-09-11 DIAGNOSIS — E119 Type 2 diabetes mellitus without complications: Secondary | ICD-10-CM | POA: Diagnosis not present

## 2016-09-11 NOTE — Progress Notes (Signed)
[facility] [place] [address] [place], [place] [address] [phone] [phone]  Date:  09/11/2016   Name:  Margaret CongressBrenda L Eckhardt   DOB:  19-Jan-1961   MRN:  [id number]  PCP:  Abbe AmsterdamOPLAND,Laurissa Cowper, MD    Chief Complaint: Follow-up   History of Present Illness:  Margaret Sims is a 56 y.o. very pleasant female patient who presents with the following:  Here today for a follow-up visit of her newly dx, diet controlled DM and HTN I saw her in December for new floaters- she is being followed by optho but all seems to be ok  She did eat some chips about 4.5 hours ago- we will check her CHL today She does not really follow her BP at home   Last mammo was in 2012- she is aware that an update is needed Tetanus 10 years ago- will update for her today  Pneumonia and flu shots are UTD She is feeling quite well, does not have any concerns She has not noted sx of high or low blood sugar  BP Readings from Last 3 Encounters:  09/11/16 (!) 139/93  06/20/16 140/90  05/01/16 132/90   Lab Results  Component Value Date   HGBA1C 6.5 04/16/2016     Patient Active Problem List   Diagnosis Date Noted  . Diabetes type 2, controlled (HCC) 04/16/2016  . Essential hypertension 02/19/2016  . Dyslipidemia 02/19/2016  . Tobacco use disorder 02/19/2016    Past Medical History:  Diagnosis Date  . Acid reflux   . Diabetes type 2, controlled (HCC) 04/16/2016  . High cholesterol   . Hypertension     Past Surgical History:  Procedure Laterality Date  . CESAREAN SECTION      Social History  Substance Use Topics  . Smoking status: Current Every Day Smoker    Types: Cigarettes  . Smokeless tobacco: Not on file  . Alcohol use Yes    No family history on file.  No Known Allergies  Medication list has been reviewed and updated.  Current Outpatient Prescriptions on File Prior to Visit  Medication Sig Dispense Refill  . aspirin 81 MG tablet Take 81  mg by mouth daily.    Marland Kitchen. atorvastatin (LIPITOR) 40 MG tablet Take 1 tablet (40 mg total) by mouth daily. 30 tablet 11  . lisinopril (PRINIVIL,ZESTRIL) 10 MG tablet Take 1 tablet (10 mg total) by mouth daily. 90 tablet 1  . omeprazole (PRILOSEC) 40 MG capsule Take 1 capsule (40 mg total) by mouth daily. 90 capsule 1  . cyclobenzaprine (FLEXERIL) 5 MG tablet Take 1 tablet (5 mg total) by mouth 3 (three) times daily as needed for muscle spasms. (Patient not taking: Reported on 09/11/2016) 20 tablet 0   No current facility-administered medications on file prior to visit.     Review of Systems:  As per HPI- otherwise negative.   Physical Examination: Vitals:   09/11/16 1424  BP: (!) 139/93  Pulse: 88  Temp: 98.8 F (37.1 C)   Vitals:   09/11/16 1424  Weight: 169 lb 12.8 oz (77 kg)  Height: 5\' 7"  (1.702 m)   Body mass index is 26.59 kg/m. Ideal Body Weight: Weight in (lb) to have BMI = 25: 159.3  GEN: WDWN, NAD, Non-toxic, A & O x 3, slight overweight, looks well HEENT: Atraumatic, Normocephalic. Neck supple. No masses, No LAD. Ears and Nose: No external deformity. CV: RRR, No M/G/R. No JVD. No thrill.  No extra heart sounds. PULM: CTA B, no wheezes, crackles, rhonchi. No retractions. No resp. distress. No accessory muscle use. EXTR: No c/c/e NEURO Normal gait.  PSYCH: Normally interactive. Conversant. Not depressed or anxious appearing.  Calm demeanor.  Foot exam today  Assessment and Plan: Controlled type 2 diabetes mellitus without complication, without long-term current use of insulin (HCC) - Plan: Comprehensive metabolic panel, Hemoglobin A1c  Essential hypertension - Plan: Comprehensive metabolic panel  Visual floaters, bilateral  Dyslipidemia - Plan: Lipid panel  Encounter for hepatitis C screening test for low risk patient - Plan: Hepatitis C antibody  Screening for deficiency anemia - Plan: CBC  Immunization due - Plan: Tdap vaccine greater than or equal to 7yo  IM, CANCELED: Tdap vaccine greater than or equal to 7yo IM  following up on her BP and DM today BP is well controlled Check A1c Update tdap Encouraged mammo Screening for hep C today  Signed Abbe Amsterdam, MD

## 2016-09-11 NOTE — Patient Instructions (Addendum)
It was very nice to see you today- take care and I will be in touch with your labs asap Please do set up a mammogram- we can do this for you here at [facility] if you like.  The phone number for the imaging dept is [phone], or you can drop by to schedule It looks like you may also be due for a pap soon- we are glad to do this test as well  You got your tetanus shot (tdap) today- this is good for 10 years!

## 2016-09-11 NOTE — Progress Notes (Signed)
Pre visit review using our clinic tool,if applicable. No additional management support is needed unless otherwise documented below in the visit note.  

## 2016-09-12 ENCOUNTER — Encounter: Payer: Self-pay | Admitting: Family Medicine

## 2016-09-12 LAB — HEPATITIS C ANTIBODY: HCV Ab: NEGATIVE

## 2016-09-12 LAB — CBC
HEMATOCRIT: 43.2 % (ref 36.0–46.0)
Hemoglobin: 14.2 g/dL (ref 12.0–15.0)
MCHC: 33 g/dL (ref 30.0–36.0)
MCV: 96.6 fl (ref 78.0–100.0)
Platelets: 262 10*3/uL (ref 150.0–400.0)
RBC: 4.47 Mil/uL (ref 3.87–5.11)
RDW: 13.7 % (ref 11.5–15.5)
WBC: 6.6 10*3/uL (ref 4.0–10.5)

## 2016-09-12 LAB — LIPID PANEL
CHOLESTEROL: 176 mg/dL (ref 0–200)
HDL: 36.3 mg/dL — AB (ref >39.00)
LDL Cholesterol: 112 mg/dL — ABNORMAL HIGH (ref 0–99)
NonHDL: 140.12
Total CHOL/HDL Ratio: 5
Triglycerides: 141 mg/dL (ref 0.0–149.0)
VLDL: 28.2 mg/dL (ref 0.0–40.0)

## 2016-09-12 LAB — COMPREHENSIVE METABOLIC PANEL
ALBUMIN: 4.3 g/dL (ref 3.5–5.2)
ALK PHOS: 73 U/L (ref 39–117)
ALT: 10 U/L (ref 0–35)
AST: 13 U/L (ref 0–37)
BUN: 11 mg/dL (ref 6–23)
CO2: 30 mEq/L (ref 19–32)
Calcium: 9.7 mg/dL (ref 8.4–10.5)
Chloride: 108 mEq/L (ref 96–112)
Creatinine, Ser: 0.99 mg/dL (ref 0.40–1.20)
GFR: 74.42 mL/min (ref >60.00)
Glucose, Bld: 110 mg/dL — ABNORMAL HIGH (ref 70–99)
POTASSIUM: 3.6 meq/L (ref 3.5–5.1)
Sodium: 142 mEq/L (ref 135–145)
Total Bilirubin: 0.4 mg/dL (ref 0.2–1.2)
Total Protein: 7.2 g/dL (ref 6.0–8.3)

## 2016-09-12 LAB — HEMOGLOBIN A1C: Hgb A1c MFr Bld: 6.6 % — ABNORMAL HIGH (ref 4.6–6.5)

## 2016-09-18 ENCOUNTER — Emergency Department (HOSPITAL_BASED_OUTPATIENT_CLINIC_OR_DEPARTMENT_OTHER): Payer: 59

## 2016-09-18 ENCOUNTER — Emergency Department (HOSPITAL_BASED_OUTPATIENT_CLINIC_OR_DEPARTMENT_OTHER)
Admission: EM | Admit: 2016-09-18 | Discharge: 2016-09-18 | Disposition: A | Discharge status: Home / Self Care | Payer: 59 | Attending: Emergency Medicine | Admitting: Emergency Medicine

## 2016-09-18 ENCOUNTER — Encounter (HOSPITAL_BASED_OUTPATIENT_CLINIC_OR_DEPARTMENT_OTHER): Payer: Self-pay | Admitting: Emergency Medicine

## 2016-09-18 DIAGNOSIS — Z7982 Long term (current) use of aspirin: Secondary | ICD-10-CM | POA: Diagnosis not present

## 2016-09-18 DIAGNOSIS — Y92410 Unspecified street and highway as the place of occurrence of the external cause: Secondary | ICD-10-CM | POA: Insufficient documentation

## 2016-09-18 DIAGNOSIS — F17210 Nicotine dependence, cigarettes, uncomplicated: Secondary | ICD-10-CM | POA: Diagnosis not present

## 2016-09-18 DIAGNOSIS — E119 Type 2 diabetes mellitus without complications: Secondary | ICD-10-CM | POA: Insufficient documentation

## 2016-09-18 DIAGNOSIS — S92154A Nondisplaced avulsion fracture (chip fracture) of right talus, initial encounter for closed fracture: Secondary | ICD-10-CM | POA: Diagnosis not present

## 2016-09-18 DIAGNOSIS — I10 Essential (primary) hypertension: Secondary | ICD-10-CM | POA: Diagnosis not present

## 2016-09-18 DIAGNOSIS — Y9389 Activity, other specified: Secondary | ICD-10-CM | POA: Insufficient documentation

## 2016-09-18 DIAGNOSIS — Z79899 Other long term (current) drug therapy: Secondary | ICD-10-CM | POA: Diagnosis not present

## 2016-09-18 DIAGNOSIS — S99921A Unspecified injury of right foot, initial encounter: Secondary | ICD-10-CM | POA: Diagnosis present

## 2016-09-18 DIAGNOSIS — Y999 Unspecified external cause status: Secondary | ICD-10-CM | POA: Insufficient documentation

## 2016-09-18 DIAGNOSIS — S29011A Strain of muscle and tendon of front wall of thorax, initial encounter: Secondary | ICD-10-CM | POA: Insufficient documentation

## 2016-09-18 MED ORDER — IBUPROFEN 200 MG PO TABS
600.0000 mg | ORAL_TABLET | Freq: Once | ORAL | Status: AC
  Administered 2016-09-18: 600 mg via ORAL
  Filled 2016-09-18: qty 1
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
## 2016-09-18 NOTE — ED Notes (Signed)
MD at bedside discussing results with patient and family. 
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
## 2016-09-18 NOTE — ED Notes (Signed)
Pt c/o right ankle pain after standing for XRs. MD Criss AlvineGoldston made aware. Orders received.

## 2016-09-19 ENCOUNTER — Ambulatory Visit (INDEPENDENT_AMBULATORY_CARE_PROVIDER_SITE_OTHER): Payer: 59 | Admitting: Family Medicine

## 2016-09-19 DIAGNOSIS — S20211A Contusion of right front wall of thorax, initial encounter: Secondary | ICD-10-CM | POA: Diagnosis not present

## 2016-09-19 NOTE — Progress Notes (Signed)
Pre visit review using our clinic review tool, if applicable. No additional management support is needed unless otherwise documented below in the visit note. 
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
## 2016-12-20 ENCOUNTER — Ambulatory Visit (INDEPENDENT_AMBULATORY_CARE_PROVIDER_SITE_OTHER): Payer: 59 | Admitting: Family Medicine

## 2016-12-20 ENCOUNTER — Encounter: Payer: Self-pay | Admitting: *Deleted

## 2016-12-20 ENCOUNTER — Encounter: Payer: Self-pay | Admitting: Family Medicine

## 2016-12-20 VITALS — BP 140/80 | HR 89 | Temp 98.6°F | Ht 67.0 in | Wt 169.2 lb

## 2016-12-20 DIAGNOSIS — R200 Anesthesia of skin: Secondary | ICD-10-CM

## 2016-12-20 DIAGNOSIS — M545 Low back pain, unspecified: Secondary | ICD-10-CM

## 2016-12-20 DIAGNOSIS — M79674 Pain in right toe(s): Secondary | ICD-10-CM

## 2016-12-20 DIAGNOSIS — M79675 Pain in left toe(s): Secondary | ICD-10-CM | POA: Diagnosis not present

## 2016-12-20 LAB — COMPREHENSIVE METABOLIC PANEL
ALT: 8 U/L (ref 0–35)
AST: 13 U/L (ref 0–37)
Albumin: 4.2 g/dL (ref 3.5–5.2)
Alkaline Phosphatase: 77 U/L (ref 39–117)
BUN: 12 mg/dL (ref 6–23)
CHLORIDE: 108 meq/L (ref 96–112)
CO2: 28 meq/L (ref 19–32)
Calcium: 9.6 mg/dL (ref 8.4–10.5)
Creatinine, Ser: 1.06 mg/dL (ref 0.40–1.20)
GFR: 68.71 mL/min (ref >60.00)
Glucose, Bld: 94 mg/dL (ref 70–99)
POTASSIUM: 3.9 meq/L (ref 3.5–5.1)
SODIUM: 141 meq/L (ref 135–145)
Total Bilirubin: 0.4 mg/dL (ref 0.2–1.2)
Total Protein: 6.8 g/dL (ref 6.0–8.3)

## 2016-12-20 LAB — CBC WITH DIFFERENTIAL/PLATELET
BASOS ABS: 0.1 10*3/uL (ref 0.0–0.1)
BASOS PCT: 0.8 % (ref 0.0–3.0)
EOS ABS: 0.1 10*3/uL (ref 0.0–0.7)
Eosinophils Relative: 1.6 % (ref 0.0–5.0)
HCT: 40.9 % (ref 36.0–46.0)
HEMOGLOBIN: 13.4 g/dL (ref 12.0–15.0)
LYMPHS PCT: 51.5 % — AB (ref 12.0–46.0)
Lymphs Abs: 3.7 10*3/uL (ref 0.7–4.0)
MCHC: 32.9 g/dL (ref 30.0–36.0)
MCV: 95.6 fl (ref 78.0–100.0)
MONOS PCT: 8.2 % (ref 3.0–12.0)
Monocytes Absolute: 0.6 10*3/uL (ref 0.1–1.0)
NEUTROS ABS: 2.7 10*3/uL (ref 1.4–7.7)
Neutrophils Relative %: 37.9 % — ABNORMAL LOW (ref 43.0–77.0)
Platelets: 227 10*3/uL (ref 150.0–400.0)
RBC: 4.28 Mil/uL (ref 3.87–5.11)
RDW: 13.9 % (ref 11.5–15.5)
WBC: 7.2 10*3/uL (ref 4.0–10.5)

## 2016-12-20 LAB — MAGNESIUM: Magnesium: 1.9 mg/dL (ref 1.5–2.5)

## 2016-12-20 LAB — FOLATE: Folate: 9.1 ng/mL (ref >5.9)

## 2016-12-20 LAB — VITAMIN B12: VITAMIN B 12: 324 pg/mL (ref 211–911)

## 2016-12-20 MED ORDER — CYCLOBENZAPRINE HCL 5 MG PO TABS: 5.0000 mg | ORAL_TABLET | Freq: Three times a day (TID) | ORAL | 0 refills | Status: DC | PRN

## 2016-12-20 MED ORDER — NAPROXEN 500 MG PO TABS: 500.0000 mg | ORAL_TABLET | Freq: Two times a day (BID) | ORAL | 0 refills | Status: DC

## 2016-12-20 NOTE — Progress Notes (Signed)
Chief Complaint  Patient presents with  . Back Pain    lower mainly on the (L) side-started on Wed  . Toe Pain    along with numbness--(B)-x 1 mos  . Knee Pain    behind the knee (R)-x 3 weeks    Subjective:  Patient is a 56 y.o. female here for msk pain   1 mo numbness and pain on 2nd and 3rd toes. Nothing makes it better. No new shoes, Change in activity or injury, did break talus in March, but L foot seems to be worse. She was using Tylenol for her back, however no snow difference with her pain in the toes. She does not currently have any pain in her toes other than the fourth toe on the right. Denies redness, swelling or fevers.   L low back pain, sharp started 2 days ago. It has happened before. No injury or change in activity. She is not an active person. She has been using Tylenol that has not been particularly helpful. No numbness, tingling, or loss of bowel/bladder function.  ROS: MSK: As noted above Neuro: No weakness, +numbness in toes  No family history on file. Past Medical History:  Diagnosis Date  . Acid reflux   . Diabetes type 2, controlled (HCC) 04/16/2016  . High cholesterol   . Hypertension    No Known Allergies  Current Outpatient Prescriptions:  .  aspirin 81 MG tablet, Take 81 mg by mouth daily., Disp: , Rfl:  .  atorvastatin (LIPITOR) 40 MG tablet, Take 1 tablet (40 mg total) by mouth daily., Disp: 30 tablet, Rfl: 11 .  lisinopril (PRINIVIL,ZESTRIL) 10 MG tablet, Take 1 tablet (10 mg total) by mouth daily., Disp: 90 tablet, Rfl: 1 .  omeprazole (PRILOSEC) 40 MG capsule, Take 1 capsule (40 mg total) by mouth daily., Disp: 90 capsule, Rfl: 1 .  cyclobenzaprine (FLEXERIL) 5 MG tablet, Take 1 tablet (5 mg total) by mouth 3 (three) times daily as needed for muscle spasms., Disp: 20 tablet, Rfl: 0 .  naproxen (NAPROSYN) 500 MG tablet, Take 1 tablet (500 mg total) by mouth 2 (two) times daily with a meal., Disp: 30 tablet, Rfl: 0  Objective: BP 140/80 (BP  Location: Left Arm, Patient Position: Sitting, Cuff Size: Normal)   Pulse 89   Temp 98.6 F (37 C) (Oral)   Ht 5\' 7"  (1.702 m)   Wt 169 lb 3.2 oz (76.7 kg)   SpO2 99%   BMI 26.50 kg/m  General: Awake, appears stated age HEENT: MMM, EOMi Heart: RRR, 1/4 DP pulse b/l Lungs: No accessory muscle use Neuro: Sensation intact to pinprick on toes b/l, DTR 1/4 patellar, 0/4 calcaneal b/l, no clonus, no cerebellar signs MSK: +TTP over L lumbar paraspinal musculature. Neg Straight leg/Lesegue's b/l; Mild TTP over the 4th DIP on R, no other tenderness over toes, no erythema, no warmth, no edema Psych: Age appropriate judgment and insight, normal affect and mood  Assessment and Plan: Numbness - Plan: Comprehensive metabolic panel, B12, Folate, Magnesium, CBC w/Diff  Pain of toes of both feet - Plan: naproxen (NAPROSYN) 500 MG tablet  Acute left-sided low back pain without sciatica - Plan: cyclobenzaprine (FLEXERIL) 5 MG tablet, naproxen (NAPROSYN) 500 MG tablet  Orders as above. Trial antiinflammatory, muscle relaxant for LBP. Numbness could be related to diabetes, however will rule out vitamin deficiencies and electrolyte abnormalities as above. Home stretches and exercises given, heat. Follow-up with regular PCP in 4 weeks, sooner if things are worsening or  if new symptoms arise. The patient voiced understanding and agreement to the plan.  Jilda Roche [place], DO 12/20/16  8:38 AM

## 2016-12-20 NOTE — Patient Instructions (Signed)
Heat (pad or rice pillow in microwave) over affected area, 10-15 minutes every 2-3 hours while awake.   OK to take Tylenol 1000 mg (2 extra strength tabs) or 975 mg (3 regular strength tabs) every 6 hours as needed.  EXERCISES  RANGE OF MOTION (ROM) AND STRETCHING EXERCISES - Low Back Pain Most people with lower back pain will find that their symptoms get worse with excessive bending forward (flexion) or arching at the lower back (extension). The exercises that will help resolve your symptoms will focus on the opposite motion.  Your physician, physical therapist or athletic trainer will help you determine which exercises will be most helpful to resolve your lower back pain. Do not complete any exercises without first consulting with your caregiver. Discontinue any exercises which make your symptoms worse, until you speak to your caregiver. If you have pain, numbness or tingling which travels down into your buttocks, leg or foot, the goal of the therapy is for these symptoms to move closer to your back and eventually resolve. Sometimes, these leg symptoms will get better, but your lower back pain may worsen. This is often an indication of progress in your rehabilitation. Be very alert to any changes in your symptoms and the activities in which you participated in the 24 hours prior to the change. Sharing this information with your caregiver will allow him or her to most efficiently treat your condition. These exercises may help you when beginning to rehabilitate your injury. Your symptoms may resolve with or without further involvement from your physician, physical therapist or athletic trainer. While completing these exercises, remember:   Restoring tissue flexibility helps normal motion to return to the joints. This allows healthier, less painful movement and activity.  An effective stretch should be held for at least 30 seconds.  A stretch should never be painful. You should only feel a gentle  lengthening or release in the stretched tissue. FLEXION RANGE OF MOTION AND STRETCHING EXERCISES:  STRETCH - Flexion, Single Knee to Chest   Lie on a firm bed or floor with both legs extended in front of you.  Keeping one leg in contact with the floor, bring your opposite knee to your chest. Hold your leg in place by either grabbing behind your thigh or at your knee.  Pull until you feel a gentle stretch in your low back. Hold 15-20 seconds.  Slowly release your grasp and repeat the exercise with the opposite side. Repeat 2 times. Complete this exercise 1-2 times per day.   STRETCH - Flexion, Double Knee to Chest  Lie on a firm bed or floor with both legs extended in front of you.  Keeping one leg in contact with the floor, bring your opposite knee to your chest.  Tense your stomach muscles to support your back and then lift your other knee to your chest. Hold your legs in place by either grabbing behind your thighs or at your knees.  Pull both knees toward your chest until you feel a gentle stretch in your low back. Hold 15-20 seconds.  Tense your stomach muscles and slowly return one leg at a time to the floor. Repeat 2 times. Complete this exercise 1-2 times per day.   STRETCH - Low Trunk Rotation  Lie on a firm bed or floor. Keeping your legs in front of you, bend your knees so they are both pointed toward the ceiling and your feet are flat on the floor.  Extend your arms out to the side. This  will stabilize your upper body by keeping your shoulders in contact with the floor.  Gently and slowly drop both knees together to one side until you feel a gentle stretch in your low back. Hold for 15-20 seconds.  Tense your stomach muscles to support your lower back as you bring your knees back to the starting position. Repeat the exercise to the other side. Repeat 2 times. Complete this exercise 1-2 times per day  EXTENSION RANGE OF MOTION AND FLEXIBILITY EXERCISES:  STRETCH -  Extension, Prone on Elbows   Lie on your stomach on the floor, a bed will be too soft. Place your palms about shoulder width apart and at the height of your head.  Place your elbows under your shoulders. If this is too painful, stack pillows under your chest.  Allow your body to relax so that your hips drop lower and make contact more completely with the floor.  Hold this position for 15-20 seconds.  Slowly return to lying flat on the floor. Repeat 2 times. Complete this exercise 1-2 times per day.   RANGE OF MOTION - Extension, Prone Press Ups  Lie on your stomach on the floor, a bed will be too soft. Place your palms about shoulder width apart and at the height of your head.  Keeping your back as relaxed as possible, slowly straighten your elbows while keeping your hips on the floor. You may adjust the placement of your hands to maximize your comfort. As you gain motion, your hands will come more underneath your shoulders.  Hold this position 15-20 seconds.  Slowly return to lying flat on the floor. Repeat 2 times. Complete this exercise 1-2 times per day.   RANGE OF MOTION- Quadruped, Neutral Spine   Assume a hands and knees position on a firm surface. Keep your hands under your shoulders and your knees under your hips. You may place padding under your knees for comfort.  Drop your head and point your tailbone toward the ground below you. This will round out your lower back like an angry cat. Hold this position for 15-20 seconds.  Slowly lift your head and release your tail bone so that your back sags into a large arch, like an old horse.  Hold this position for 15-20 seconds.  Repeat this until you feel limber in your low back.  Now, find your "sweet spot." This will be the most comfortable position somewhere between the two previous positions. This is your neutral spine. Once you have found this position, tense your stomach muscles to support your low back.  Hold this  position for 15-20 seconds. Repeat 2 times. Complete this exercise 1-2 times per day.  STRENGTHENING EXERCISES - Low Back Sprain These exercises may help you when beginning to rehabilitate your injury. These exercises should be done near your "sweet spot." This is the neutral, low-back arch, somewhere between fully rounded and fully arched, that is your least painful position. When performed in this safe range of motion, these exercises can be used for people who have either a flexion or extension based injury. These exercises may resolve your symptoms with or without further involvement from your physician, physical therapist or athletic trainer. While completing these exercises, remember:   Muscles can gain both the endurance and the strength needed for everyday activities through controlled exercises.  Complete these exercises as instructed by your physician, physical therapist or athletic trainer. Increase the resistance and repetitions only as guided.  You may experience muscle soreness or  fatigue, but the pain or discomfort you are trying to eliminate should never worsen during these exercises. If this pain does worsen, stop and make certain you are following the directions exactly. If the pain is still present after adjustments, discontinue the exercise until you can discuss the trouble with your caregiver.  STRENGTHENING - Deep Abdominals, Pelvic Tilt   Lie on a firm bed or floor. Keeping your legs in front of you, bend your knees so they are both pointed toward the ceiling and your feet are flat on the floor.  Tense your lower abdominal muscles to press your low back into the floor. This motion will rotate your pelvis so that your tail bone is scooping upwards rather than pointing at your feet or into the floor. With a gentle tension and even breathing, hold this position for 10-15 seconds. Repeat 2 times. Complete this exercise 1 time per day.   STRENGTHENING - Abdominals, Crunches    Lie on a firm bed or floor. Keeping your legs in front of you, bend your knees so they are both pointed toward the ceiling and your feet are flat on the floor. Cross your arms over your chest.  Slightly tip your chin down without bending your neck.  Tense your abdominals and slowly lift your trunk high enough to just clear your shoulder blades. Lifting higher can put excessive stress on the lower back and does not further strengthen your abdominal muscles.  Control your return to the starting position. Repeat 2 times. Complete this exercise once every 1-2 days.   STRENGTHENING - Quadruped, Opposite UE/LE Lift   Assume a hands and knees position on a firm surface. Keep your hands under your shoulders and your knees under your hips. You may place padding under your knees for comfort.  Find your neutral spine and gently tense your abdominal muscles so that you can maintain this position. Your shoulders and hips should form a rectangle that is parallel with the floor and is not twisted.  Keeping your trunk steady, lift your right hand no higher than your shoulder and then your left leg no higher than your hip. Make sure you are not holding your breath. Hold this position for 15-20 seconds.  Continuing to keep your abdominal muscles tense and your back steady, slowly return to your starting position. Repeat with the opposite arm and leg. Repeat 2 times. Complete this exercise once every 1-2 days.   STRENGTHENING - Abdominals and Quadriceps, Straight Leg Raise   Lie on a firm bed or floor with both legs extended in front of you.  Keeping one leg in contact with the floor, bend the other knee so that your foot can rest flat on the floor.  Find your neutral spine, and tense your abdominal muscles to maintain your spinal position throughout the exercise.  Slowly lift your straight leg off the floor about 6 inches for a count of 15, making sure to not hold your breath.  Still keeping your  neutral spine, slowly lower your leg all the way to the floor. Repeat this exercise with each leg 2 times. Complete this exercise once every 1-2 days. POSTURE AND BODY MECHANICS CONSIDERATIONS - Low Back Sprain Keeping correct posture when sitting, standing or completing your activities will reduce the stress put on different body tissues, allowing injured tissues a chance to heal and limiting painful experiences. The following are general guidelines for improved posture. Your physician or physical therapist will provide you with any instructions specific to  your needs. While reading these guidelines, remember:  The exercises prescribed by your provider will help you have the flexibility and strength to maintain correct postures.  The correct posture provides the best environment for your joints to work. All of your joints have less wear and tear when properly supported by a spine with good posture. This means you will experience a healthier, less painful body.  Correct posture must be practiced with all of your activities, especially prolonged sitting and standing. Correct posture is as important when doing repetitive low-stress activities (typing) as it is when doing a single heavy-load activity (lifting).  RESTING POSITIONS Consider which positions are most painful for you when choosing a resting position. If you have pain with flexion-based activities (sitting, bending, stooping, squatting), choose a position that allows you to rest in a less flexed posture. You would want to avoid curling into a fetal position on your side. If your pain worsens with extension-based activities (prolonged standing, working overhead), avoid resting in an extended position such as sleeping on your stomach. Most people will find more comfort when they rest with their spine in a more neutral position, neither too rounded nor too arched. Lying on a non-sagging bed on your side with a pillow between your knees, or on your  back with a pillow under your knees will often provide some relief. Keep in mind, being in any one position for a prolonged period of time, no matter how correct your posture, can still lead to stiffness. PROPER SITTING POSTURE In order to minimize stress and discomfort on your spine, you must sit with correct posture. Sitting with good posture should be effortless for a healthy body. Returning to good posture is a gradual process. Many people can work toward this most comfortably by using various supports until they have the flexibility and strength to maintain this posture on their own. When sitting with proper posture, your ears will fall over your shoulders and your shoulders will fall over your hips. You should use the back of the chair to support your upper back. Your lower back will be in a neutral position, just slightly arched. You may place a small pillow or folded towel at the base of your lower back for  support.  When working at a desk, create an environment that supports good, upright posture. Without extra support, muscles tire, which leads to excessive strain on joints and other tissues. Keep these recommendations in mind:  CHAIR:  A chair should be able to slide under your desk when your back makes contact with the back of the chair. This allows you to work closely.  The chair's height should allow your eyes to be level with the upper part of your monitor and your hands to be slightly lower than your elbows.  BODY POSITION  Your feet should make contact with the floor. If this is not possible, use a foot rest.  Keep your ears over your shoulders. This will reduce stress on your neck and low back.  INCORRECT SITTING POSTURES  If you are feeling tired and unable to assume a healthy sitting posture, do not slouch or slump. This puts excessive strain on your back tissues, causing more damage and pain. Healthier options include:  Using more support, like a lumbar  pillow.  Switching tasks to something that requires you to be upright or walking.  Talking a brief walk.  Lying down to rest in a neutral-spine position.  PROLONGED STANDING WHILE SLIGHTLY LEANING FORWARD  When  completing a task that requires you to lean forward while standing in one place for a long time, place either foot up on a stationary 2-4 inch high object to help maintain the best posture. When both feet are on the ground, the lower back tends to lose its slight inward curve. If this curve flattens (or becomes too large), then the back and your other joints will experience too much stress, tire more quickly, and can cause pain.  CORRECT STANDING POSTURES Proper standing posture should be assumed with all daily activities, even if they only take a few moments, like when brushing your teeth. As in sitting, your ears should fall over your shoulders and your shoulders should fall over your hips. You should keep a slight tension in your abdominal muscles to brace your spine. Your tailbone should point down to the ground, not behind your body, resulting in an over-extended swayback posture.   INCORRECT STANDING POSTURES  Common incorrect standing postures include a forward head, locked knees and/or an excessive swayback. WALKING Walk with an upright posture. Your ears, shoulders and hips should all line-up.  PROLONGED ACTIVITY IN A FLEXED POSITION When completing a task that requires you to bend forward at your waist or lean over a low surface, try to find a way to stabilize 3 out of 4 of your limbs. You can place a hand or elbow on your thigh or rest a knee on the surface you are reaching across. This will provide you more stability, so that your muscles do not tire as quickly. By keeping your knees relaxed, or slightly bent, you will also reduce stress across your lower back. CORRECT LIFTING TECHNIQUES  DO :  Assume a wide stance. This will provide you more stability and the opportunity  to get as close as possible to the object which you are lifting.  Tense your abdominals to brace your spine. Bend at the knees and hips. Keeping your back locked in a neutral-spine position, lift using your leg muscles. Lift with your legs, keeping your back straight.  Test the weight of unknown objects before attempting to lift them.  Try to keep your elbows locked down at your sides in order get the best strength from your shoulders when carrying an object.     Always ask for help when lifting heavy or awkward objects. INCORRECT LIFTING TECHNIQUES DO NOT:   Lock your knees when lifting, even if it is a small object.  Bend and twist. Pivot at your feet or move your feet when needing to change directions.  Assume that you can safely pick up even a paperclip without proper posture.

## 2017-01-20 ENCOUNTER — Ambulatory Visit: Payer: TRICARE For Life (TFL) | Admitting: Family Medicine

## 2017-02-14 ENCOUNTER — Telehealth: Payer: Self-pay | Admitting: Family Medicine

## 2017-02-14 MED ORDER — LISINOPRIL 10 MG PO TABS: 10.0000 mg | ORAL_TABLET | Freq: Every day | ORAL | 0 refills | Status: DC

## 2017-02-14 MED ORDER — OMEPRAZOLE 40 MG PO CPDR: 40.0000 mg | DELAYED_RELEASE_CAPSULE | Freq: Every day | ORAL | 0 refills | Status: DC

## 2017-02-14 NOTE — Telephone Encounter (Signed)
Self.   Refill for lisinopril, omeprazole    Pharmacy: [facility]   Pt said that she is completely out. She said that her pharmacy had already sent in previous request. Informed pt that Im not showing.    PCP is out of the office. Forwarded request to [facility]

## 2017-02-14 NOTE — Telephone Encounter (Signed)
Rxs sent. Left message for pt to return my call to notify her that request has been completed.

## 2017-02-21 ENCOUNTER — Ambulatory Visit (INDEPENDENT_AMBULATORY_CARE_PROVIDER_SITE_OTHER): Payer: 59 | Admitting: Medical

## 2017-02-21 VITALS — BP 122/80 | HR 76 | Temp 98.2°F | Resp 18 | Wt 166.0 lb

## 2017-02-21 DIAGNOSIS — R110 Nausea: Secondary | ICD-10-CM

## 2017-02-21 DIAGNOSIS — K219 Gastro-esophageal reflux disease without esophagitis: Secondary | ICD-10-CM

## 2017-02-21 DIAGNOSIS — E7800 Pure hypercholesterolemia, unspecified: Secondary | ICD-10-CM | POA: Diagnosis not present

## 2017-02-21 DIAGNOSIS — R197 Diarrhea, unspecified: Secondary | ICD-10-CM | POA: Diagnosis not present

## 2017-02-21 MED ORDER — ONDANSETRON 8 MG PO TBDP: 8.0000 mg | ORAL_TABLET | Freq: Three times a day (TID) | ORAL | 0 refills | Status: DC | PRN

## 2017-02-21 MED ORDER — ATORVASTATIN CALCIUM 40 MG PO TABS: 40.0000 mg | ORAL_TABLET | Freq: Every day | ORAL | 11 refills | Status: DC

## 2017-02-21 NOTE — Patient Instructions (Signed)
Your diarrhea is much better and appears to be resolving. Likely was viral. Criss Rosales foods/healthy foods. If re-flares then recommend stool panel kit. If loose stool over the weekend then immodium  For nauseau rx zofran.  For abdomen pain possible flare of reflux continue omeprazole and add zantac otc for a week. If abdomen pain increases recommend labs on Monday or Tuesday.  Follow up in 7 days or as needed

## 2017-02-21 NOTE — Progress Notes (Signed)
12/28 

## 2017-02-21 NOTE — Progress Notes (Signed)
Subjective:    Patient ID: Margaret Sims, female    DOB: [birth year], 56 y.o.   MRN: [id number]  HPI  Pt in stating yesterday and day before felt nausea with some loose stools. She states for those 2 days had 3 loose stools a day. Now stools only mild loose but not diarrhea. No fever. She has been sweating some but little confused if maybe hot flash. No family or friends sick with GI illness. No recent antibiotic.  Pt states some abd pain on wed. Pain more at top of stomach this am. She is belching some. Has sour bitter taste with burps. After she ate yesterday  had some nausea. But no vomiting. She does take omeprazole But recently ran out of medication. She was off of med for 4 days. But restarted this past Saturday.  No chest pain.no back pain. No uti signs or symptoms reported.  Review of Systems  Constitutional: Negative for chills, fatigue and fever.  Respiratory: Negative for cough, chest tightness, shortness of breath and wheezing.   Cardiovascular: Negative for chest pain and palpitations.  Gastrointestinal: Positive for abdominal pain and nausea. Negative for abdominal distention, diarrhea and vomiting.  Genitourinary: Negative for dysuria, frequency and hematuria.  Musculoskeletal: Negative for back pain and myalgias.  Skin: Negative for rash.  Neurological: Negative for dizziness and headaches.  Hematological: Negative for adenopathy. Does not bruise/bleed easily.  Psychiatric/Behavioral: Negative for behavioral problems and dysphoric mood. The patient is not nervous/anxious.    Past Medical History:  Diagnosis Date  . Acid reflux   . Diabetes type 2, controlled ([place]) 04/16/2016  . High cholesterol   . Hypertension      Social History   Social History  . Marital status: Married    Spouse name: N/A  . Number of children: N/A  . Years of education: N/A   Occupational History  . Not on file.   Social History Main Topics  . Smoking status: Current Every Day  Smoker    Types: Cigarettes  . Smokeless tobacco: Never Used  . Alcohol use Yes  . Drug use: Unknown  . Sexual activity: Yes    Birth control/ protection: Post-menopausal   Other Topics Concern  . Not on file   Social History Narrative  . No narrative on file    Past Surgical History:  Procedure Laterality Date  . CESAREAN SECTION      No family history on file.  No Known Allergies  Current Outpatient Prescriptions on File Prior to Visit  Medication Sig Dispense Refill  . aspirin 81 MG tablet Take 81 mg by mouth daily.    Marland Kitchen lisinopril (PRINIVIL,ZESTRIL) 10 MG tablet Take 1 tablet (10 mg total) by mouth daily. 90 tablet 0  . naproxen (NAPROSYN) 500 MG tablet Take 1 tablet (500 mg total) by mouth 2 (two) times daily with a meal. 30 tablet 0  . omeprazole (PRILOSEC) 40 MG capsule Take 1 capsule (40 mg total) by mouth daily. 90 capsule 0   No current facility-administered medications on file prior to visit.     BP 122/80 (BP Location: Left Arm, Patient Position: Sitting, Cuff Size: Normal)   Pulse 76   Temp 98.2 F (36.8 C) (Oral)   Resp 18   Wt 166 lb (75.3 kg)   SpO2 91%   BMI 26.00 kg/m       Objective:   Physical Exam  General Appearance- Not in acute distress.  HEENT Eyes- Scleraeral/Conjuntiva-bilat- Not Yellow. Mouth &  Throat- Normal.  Chest and Lung Exam Auscultation: Breath sounds:-Normal. Adventitious sounds:- No Adventitious sounds.  Cardiovascular Auscultation:Rythm - Regular. Heart Sounds -Normal heart sounds.  Abdomen Inspection:-Inspection Normal.  Palpation/Perucssion: Palpation and Percussion of the abdomen reveal- faint/miild epigastric Tender, No Rebound tenderness, No rigidity(Guarding) and No Palpable abdominal masses.  Liver:-Normal.  Spleen:- Normal.    Back- no cva tenderness..      Assessment & Plan:  Your diarrhea is much better and appears to be resolving. Likely was viral. Criss Rosales foods/healthy foods. If re-flares then  recommend stool panel kit. If loose stool over the weekend then immodium  For nauseau rx zofran.  For abdomen pain possible flare of reflux continue omeprazole and add zantac otc for a week. If abdomen pain increases recommend labs on Monday or Tuesday.  Follow up in 7 days or as needed

## 2017-05-17 NOTE — Progress Notes (Addendum)
[facility] [address], [address] [place], [place] [address] [phone] [phone]  Date:  05/19/2017   Name:  Margaret CongressBrenda L Litton   DOB:  14-Mar-1961   MRN:  [id number]  PCP:  Pearline Cablesopland, Naoki Migliaccio C, MD    Chief Complaint: Follow-up (headache)   History of Present Illness:  Margaret Sims is a 56 y.o. very pleasant female patient who presents with the following:  Here today for a follow-up visit- I last saw her in March Here today for a follow-up visit of her newly dx, diet controlled DM and HTN I saw her in December for new floaters- she is being followed by optho but all seems to be ok  She did eat some chips about 4.5 hours ago- we will check her CHL today She does not really follow her BP at home   Last mammo was in 2012- she is aware that an update is needed Tetanus 10 years ago- will update for her today  Pneumonia and flu shots are UTD She is feeling quite well, does not have any concerns She has not noted sx of high or low blood sugar  Lab Results  Component Value Date   HGBA1C 6.6 (H) 09/11/2016   Mammo:  About 5 years ago Dexa: never done  Pap: 2014, never had an abnl Eye exam: done about 1 months ago Flu shot: do today A1c is due today  She is on lipitor and lisinopril Pt notes that she has had some headache, pain and pressure and behind her left eye for about one week. However, it seemed to go away yesterday and she currently feels ok She was feeling hot and cold at that time as well She did have some sneezing, occasional cough She did get new corrective lenses about 3 weeks ago- her rx did change but not dramatically No aura Mild nausea but no vomiting No vision change No phono or photophobia  She has not noted any issues with her blood sugar   She is taking lipitor without any issues  BP Readings from Last 3 Encounters:  05/19/17 138/88  02/21/17 122/80  12/20/16 140/80      Patient Active Problem  List   Diagnosis Date Noted  . Diabetes type 2, controlled (HCC) 04/16/2016  . Essential hypertension 02/19/2016  . Dyslipidemia 02/19/2016  . Tobacco use disorder 02/19/2016    Past Medical History:  Diagnosis Date  . Acid reflux   . Diabetes type 2, controlled (HCC) 04/16/2016  . High cholesterol   . Hypertension     Past Surgical History:  Procedure Laterality Date  . CESAREAN SECTION      Social History   Tobacco Use  . Smoking status: Current Every Day Smoker    Types: Cigarettes  . Smokeless tobacco: Never Used  Substance Use Topics  . Alcohol use: Yes  . Drug use: Not on file    History reviewed. No pertinent family history.  No Known Allergies  Medication list has been reviewed and updated.  Current Outpatient Medications on File Prior to Visit  Medication Sig Dispense Refill  . aspirin 81 MG tablet Take 81 mg by mouth daily.    Marland Kitchen. atorvastatin (LIPITOR) 40 MG tablet Take 1 tablet (40 mg total) by mouth daily. 30 tablet 11  . lisinopril (PRINIVIL,ZESTRIL) 10 MG tablet Take 1 tablet (10 mg total) by mouth daily. 90 tablet 0  . omeprazole (PRILOSEC) 40 MG capsule Take 1  capsule (40 mg total) by mouth daily. 90 capsule 0   No current facility-administered medications on file prior to visit.     Review of Systems:  As per HPI- otherwise negative. Menopausal for about 15 years    Physical Examination: Vitals:   05/19/17 1541  BP: 138/88  Pulse: 93  Temp: 98.1 F (36.7 C)  SpO2: 98%   Vitals:   05/19/17 1541  Weight: 168 lb 8 oz (76.4 kg)  Height: 5\' 7"  (1.702 m)   Body mass index is 26.39 kg/m. Ideal Body Weight: Weight in (lb) to have BMI = 25: 159.3  GEN: WDWN, NAD, Non-toxic, A & O x 3, looks well HEENT: Atraumatic, Normocephalic. Neck supple. No masses, No LAD.  Bilateral TM wnl, oropharynx normal.  PEERL,EOMI.   Sinuses are not tender No meningismus Ears and Nose: No external deformity. CV: RRR, No M/G/R. No JVD. No thrill. No extra  heart sounds. PULM: CTA B, no wheezes, crackles, rhonchi. No retractions. No resp. distress. No accessory muscle use.Marland Kitchen. EXTR: No c/c/e NEURO Normal gait.  PSYCH: Normally interactive. Conversant. Not depressed or anxious appearing.  Calm demeanor.    Assessment and Plan: Controlled type 2 diabetes mellitus without complication, without long-term current use of insulin (HCC) - Plan: Hemoglobin A1c, Basic metabolic panel  Pure hypercholesterolemia - Plan: Lipid panel  Estrogen deficiency - Plan: DG Bone Density  Screening for breast cancer - Plan: MM SCREENING BREAST TOMO BILATERAL  Needs flu shot  Here today for a follow-up visit  Diet controlled DM- check A1c today Set up for mammo and Dexa lipitor- check lipids Flu shot Recent HA is resolved- however she will alert me if this returns   Signed Abbe AmsterdamJessica Analyce Tavares, MD  Received her labs 11/13  Results for orders placed or performed in visit on 05/19/17  Lipid panel  Result Value Ref Range   Cholesterol 177 0 - 200 mg/dL   Triglycerides 161.0157.0 (H) 0.0 - 149.0 mg/dL   HDL 96.0444.90 >54.09>39.00 mg/dL   VLDL 81.131.4 0.0 - 91.440.0 mg/dL   LDL Cholesterol 782101 (H) 0 - 99 mg/dL   Total CHOL/HDL Ratio 4    NonHDL 132.23   Hemoglobin A1c  Result Value Ref Range   Hgb A1c MFr Bld 6.3 4.6 - 6.5 %  Basic metabolic panel  Result Value Ref Range   Sodium 143 135 - 145 mEq/L   Potassium 4.4 3.5 - 5.1 mEq/L   Chloride 108 96 - 112 mEq/L   CO2 30 19 - 32 mEq/L   Glucose, Bld 85 70 - 99 mg/dL   BUN 13 6 - 23 mg/dL   Creatinine, Ser 9.561.14 0.40 - 1.20 mg/dL   Calcium 9.5 8.4 - 21.310.5 mg/dL   GFR 08.6563.09 >78.46>60.00 mL/min   Letter to pt

## 2017-05-19 ENCOUNTER — Ambulatory Visit (INDEPENDENT_AMBULATORY_CARE_PROVIDER_SITE_OTHER): Payer: 59 | Admitting: Family Medicine

## 2017-05-19 ENCOUNTER — Encounter: Payer: Self-pay | Admitting: Family Medicine

## 2017-05-19 VITALS — BP 138/88 | HR 93 | Temp 98.1°F | Ht 67.0 in | Wt 168.5 lb

## 2017-05-19 DIAGNOSIS — E2839 Other primary ovarian failure: Secondary | ICD-10-CM

## 2017-05-19 DIAGNOSIS — Z1231 Encounter for screening mammogram for malignant neoplasm of breast: Secondary | ICD-10-CM

## 2017-05-19 DIAGNOSIS — E7800 Pure hypercholesterolemia, unspecified: Secondary | ICD-10-CM | POA: Diagnosis not present

## 2017-05-19 DIAGNOSIS — E119 Type 2 diabetes mellitus without complications: Secondary | ICD-10-CM | POA: Diagnosis not present

## 2017-05-19 DIAGNOSIS — Z23 Encounter for immunization: Secondary | ICD-10-CM | POA: Diagnosis not present

## 2017-05-19 DIAGNOSIS — Z1239 Encounter for other screening for malignant neoplasm of breast: Secondary | ICD-10-CM

## 2017-05-19 NOTE — Patient Instructions (Signed)
It was very nice to see you again today!   I will set you up for a mammogram and bone density scan You got your flu shot today I will be in touch with your labs asap If your headache comes back please do alert me   Let's plan to visit in 6 months assuming all is well

## 2017-05-20 LAB — BASIC METABOLIC PANEL
BUN: 13 mg/dL (ref 6–23)
CALCIUM: 9.5 mg/dL (ref 8.4–10.5)
CO2: 30 meq/L (ref 19–32)
CREATININE: 1.14 mg/dL (ref 0.40–1.20)
Chloride: 108 mEq/L (ref 96–112)
GFR: 63.09 mL/min (ref >60.00)
GLUCOSE: 85 mg/dL (ref 70–99)
Potassium: 4.4 mEq/L (ref 3.5–5.1)
Sodium: 143 mEq/L (ref 135–145)

## 2017-05-20 LAB — LIPID PANEL
Cholesterol: 177 mg/dL (ref 0–200)
HDL: 44.9 mg/dL (ref >39.00)
LDL CALC: 101 mg/dL — AB (ref 0–99)
NONHDL: 132.23
Total CHOL/HDL Ratio: 4
Triglycerides: 157 mg/dL — ABNORMAL HIGH (ref 0.0–149.0)
VLDL: 31.4 mg/dL (ref 0.0–40.0)

## 2017-05-20 LAB — HEMOGLOBIN A1C: HEMOGLOBIN A1C: 6.3 % (ref 4.6–6.5)

## 2017-05-27 ENCOUNTER — Telehealth: Payer: Self-pay | Admitting: Family Medicine

## 2017-05-27 MED ORDER — OMEPRAZOLE 40 MG PO CPDR: 40.0000 mg | DELAYED_RELEASE_CAPSULE | Freq: Every day | ORAL | 0 refills | Status: DC

## 2017-05-27 MED ORDER — LISINOPRIL 10 MG PO TABS: 10.0000 mg | ORAL_TABLET | Freq: Every day | ORAL | 0 refills | Status: DC

## 2017-05-27 NOTE — Telephone Encounter (Signed)
Copied from CRM [phone]. Topic: Quick Communication - See Telephone Encounter >> May 27, 2017  8:24 AM Oneal GroutSebastian, Jennifer S wrote: CRM for notification. See Telephone encounter for: Med Refill  05/27/17.

## 2017-05-27 NOTE — Telephone Encounter (Signed)
LOV 05-19-2017 with Dr. Patsy Lageropland / Medication refill for Lisinopril and omeprazole / lisinopril (PRINIVIL,ZESTRIL) 10 MG tablet 90 tablet 0 05/27/2017    Sig - Route: Take 1 tablet (10 mg total) daily by mouth. - Oral   Sent to pharmacy as: lisinopril (PRINIVIL,ZESTRIL) 10 MG tablet   E-Prescribing Status: Receipt confirmed by pharmacy (05/27/2017 9:13 AM EST)    omeprazole (PRILOSEC) 40 MG capsule 90 capsule 0 05/27/2017    Sig - Route: Take 1 capsule (40 mg total) daily by mouth. - Oral   Sent to pharmacy as: omeprazole (PRILOSEC) 40 MG capsule   E-Prescribing Status: Receipt confirmed by pharmacy (05/27/2017 9:13 AM EST)    / Called patient and left voicemail that prescriptions have been sent to pharmacy.

## 2017-05-27 NOTE — Telephone Encounter (Signed)
Request refill on Linisopril 10mg  and omeprazole 40mg , please call into [facility]. Patient request call once sent

## 2017-05-28 ENCOUNTER — Other Ambulatory Visit: Payer: Self-pay | Admitting: Emergency Medicine

## 2017-06-07 NOTE — Progress Notes (Addendum)
[facility] [address], [address] [place], [place] [address] 336 [phone] [phone]  Date:  06/09/2017   Name:  Margaret CongressBrenda L Sims   DOB:  1961-02-07   MRN:  [id number]  PCP:  Pearline Cablesopland, Kimberli Winne C, MD    Chief Complaint: Headache (Onset of headaches was about 3 weeks before thanksgiving. Pt states it wasn't as bad during her last visit. States it's not bad today but yesterday was "rough with a headache on one side" )   History of Present Illness:  Margaret CongressBrenda L Sims is a 56 y.o. very pleasant female patient who presents with the following:  History of DM, HTN, dyslipidemia I saw her last month for a follow-up visit  Here today with concern of persistent headaches- she had mentioned this at our last visit on 11/12 as well She first noted this at the start of November Never had this sort of HA trouble in the past.   She notes the HA every 2-3 days.  When she has the HA it will last most of the day- it may also make her feel nauseated, hot and cold.  The HA seem to be always on the left side of her head No vomiting She does not notice any photo or phonophobia.  No vision or hearing change She does not get aura   She is still taking her lisinopril  She is using tylenol only for the HA She is not aware of a family history of migraine headache  BP Readings from Last 3 Encounters:  06/09/17 122/80  05/19/17 138/88  02/21/17 122/80   Lab Results  Component Value Date   HGBA1C 6.3 05/19/2017     Lab Results  Component Value Date   HGBA1C 6.3 05/19/2017     Patient Active Problem List   Diagnosis Date Noted  . Diabetes type 2, controlled (HCC) 04/16/2016  . Essential hypertension 02/19/2016  . Dyslipidemia 02/19/2016  . Tobacco use disorder 02/19/2016    Past Medical History:  Diagnosis Date  . Acid reflux   . Diabetes type 2, controlled (HCC) 04/16/2016  . High cholesterol   . Hypertension     Past Surgical History:   Procedure Laterality Date  . CESAREAN SECTION      Social History   Tobacco Use  . Smoking status: Current Every Day Smoker    Types: Cigarettes  . Smokeless tobacco: Never Used  Substance Use Topics  . Alcohol use: Yes  . Drug use: Not on file    No family history on file.  No Known Allergies  Medication list has been reviewed and updated.  Current Outpatient Medications on File Prior to Visit  Medication Sig Dispense Refill  . aspirin 81 MG tablet Take 81 mg by mouth daily.    Marland Kitchen. atorvastatin (LIPITOR) 40 MG tablet Take 1 tablet (40 mg total) by mouth daily. 30 tablet 11  . lisinopril (PRINIVIL,ZESTRIL) 10 MG tablet Take 1 tablet (10 mg total) daily by mouth. 90 tablet 0  . omeprazole (PRILOSEC) 40 MG capsule Take 1 capsule (40 mg total) daily by mouth. 90 capsule 0   No current facility-administered medications on file prior to visit.     Review of Systems:  As per HPI- otherwise negative. No fever or chills No CP or SOB No rash No change in her vision or hearing She does feel that her HA yesterday was one of the worst of her life  Physical Examination: Vitals:  06/09/17 1443 06/09/17 1506  BP: (!) 150/100 122/80  Pulse: 72   Temp: 97.7 F (36.5 C)   SpO2: 99%    Vitals:   06/09/17 1443  Weight: 167 lb (75.8 kg)  Height: 5\' 7"  (1.702 m)   Body mass index is 26.16 kg/m. Ideal Body Weight: Weight in (lb) to have BMI = 25: 159.3  GEN: WDWN, NAD, Non-toxic, A & O x 3, normal weight, looks well HEENT: Atraumatic, Normocephalic. Neck supple. No masses, No LAD.  Bilateral TM wnl, oropharynx normal.  PEERL,EOMI.   No meningismus Ears and Nose: No external deformity. CV: RRR, No M/G/R. No JVD. No thrill. No extra heart sounds. PULM: CTA B, no wheezes, crackles, rhonchi. No retractions. No resp. distress. No accessory muscle use. ABD: S, NT, ND, +BS. No rebound. No HSM. EXTR: No c/c/e NEURO Normal gait.  Normal strength, sensation and DTR of all  extremities,  Normal facial movement and strength  PSYCH: Normally interactive. Conversant. Not depressed or anxious appearing.  Calm demeanor.    Assessment and Plan: Nonintractable episodic headache, unspecified headache type - Plan: CT Head Wo Contrast, SUMAtriptan (IMITREX) 50 MG tablet  Here today with change in headache pattern.  Her HA do sound suspicious for migraine, but these are a new development for her Gave rx for imitrex to use prn for headache Discussed CT vs MRI- she would prefer to start with a CT due to cost of MRI If CT is normal and HA respond to treatment we will feel reassured   Signed Abbe AmsterdamJessica Robby Bulkley, MD  Received her CT head 12/6 CT HEAD WITHOUT CONTRAST  TECHNIQUE: Contiguous axial images were obtained from the base of the skull through the vertex without intravenous contrast.  COMPARISON: None.  FINDINGS: Brain: No evidence of acute infarction, hemorrhage, hydrocephalus, extra-axial collection or mass lesion/mass effect.  Vascular: No hyperdense vessel or unexpected calcification Skull: Normal. Negative for fracture or focal lesion. Sinuses/Orbits: No acute finding. Other: None. IMPRESSION: Normal head CT without contrast  Called her- she has not had a HA since she was here.  CT head is normal She will try the imitrex when she next gets a HA and let me know how it works for her She will also alert me if any change or worsening of her sx, or if any other concerns

## 2017-06-09 ENCOUNTER — Encounter: Payer: Self-pay | Admitting: Family Medicine

## 2017-06-09 ENCOUNTER — Ambulatory Visit (INDEPENDENT_AMBULATORY_CARE_PROVIDER_SITE_OTHER): Payer: 59 | Admitting: Family Medicine

## 2017-06-09 DIAGNOSIS — R519 Headache, unspecified: Secondary | ICD-10-CM

## 2017-06-09 DIAGNOSIS — R51 Headache: Secondary | ICD-10-CM | POA: Diagnosis not present

## 2017-06-09 MED ORDER — SUMATRIPTAN SUCCINATE 50 MG PO TABS: ORAL_TABLET | ORAL | 3 refills | Status: DC

## 2017-06-09 NOTE — Patient Instructions (Signed)
Good to see you today!  Please fill the rx for imitrex and take 1 at the first sign of your next headache. Let me know how this works for you.  I am also going to set you up for a CT scan of your head. Let me know if you don't hear about your CT scan, or if you have any change or worsening of your symptoms

## 2017-06-11 ENCOUNTER — Encounter: Payer: Self-pay | Admitting: Family Medicine

## 2017-06-11 ENCOUNTER — Ambulatory Visit (HOSPITAL_BASED_OUTPATIENT_CLINIC_OR_DEPARTMENT_OTHER)
Admission: RE | Admit: 2017-06-11 | Discharge: 2017-06-11 | Disposition: A | Discharge status: Home / Self Care | Payer: 59 | Source: Ambulatory Visit | Attending: Family Medicine | Admitting: Family Medicine

## 2017-06-11 DIAGNOSIS — R51 Headache: Principal | ICD-10-CM

## 2017-06-11 DIAGNOSIS — E2839 Other primary ovarian failure: Secondary | ICD-10-CM | POA: Diagnosis not present

## 2017-06-11 DIAGNOSIS — Z1231 Encounter for screening mammogram for malignant neoplasm of breast: Secondary | ICD-10-CM | POA: Diagnosis not present

## 2017-06-11 DIAGNOSIS — Z1239 Encounter for other screening for malignant neoplasm of breast: Secondary | ICD-10-CM

## 2017-06-11 DIAGNOSIS — R519 Headache, unspecified: Secondary | ICD-10-CM

## 2017-09-04 ENCOUNTER — Other Ambulatory Visit: Payer: Self-pay

## 2017-09-04 ENCOUNTER — Telehealth: Payer: Self-pay | Admitting: Family Medicine

## 2017-09-04 MED ORDER — LISINOPRIL 10 MG PO TABS: 10.0000 mg | ORAL_TABLET | Freq: Every day | ORAL | 1 refills | Status: DC

## 2017-09-04 MED ORDER — OMEPRAZOLE 40 MG PO CPDR: 40.0000 mg | DELAYED_RELEASE_CAPSULE | Freq: Every day | ORAL | 1 refills | Status: DC

## 2017-09-04 NOTE — Telephone Encounter (Signed)
omeprazole (PRILOSEC) 40 MG capsule 90 capsule 1 09/04/2017    Sig - Route: Take 1 capsule (40 mg total) by mouth daily. - Oral   Sent to pharmacy as: omeprazole (PRILOSEC) 40 MG capsule   E-Prescribing Status: Receipt confirmed by pharmacy (09/04/2017 1:25 PM EST)    /  Disp Refills Start End   lisinopril (PRINIVIL,ZESTRIL) 10 MG tablet 90 tablet 1 09/04/2017    Sig - Route: Take 1 tablet (10 mg total) by mouth daily. - Oral   Sent to pharmacy as: lisinopril (PRINIVIL,ZESTRIL) 10 MG tablet   E-Prescribing Status: Receipt confirmed by pharmacy (09/04/2017 1:24 PM EST)    Medication has been sent to pharmacy

## 2017-09-04 NOTE — Telephone Encounter (Signed)
Copied from CRM [phone]. Topic: Quick Communication - Rx Refill/Question >> Sep 04, 2017  9:08 AM Clack, Shanda BumpsJessica D wrote: Medication:  omeprazole (PRILOSEC) 40 MG capsule [[id number]][[id number]] and  lisinopril (PRINIVIL,ZESTRIL) 10 MG tablet [[id number]][[id number]]    Has the patient contacted their pharmacy? Yes.     (Agent: If no, request that the patient contact the pharmacy for the refill.)   Preferred Pharmacy (with phone number or street name): [facility] - [place], [place] - [address] [phone] (Phone) [phone] (Fax)  Pt states she is out of medication.   Agent: Please be advised that RX refills may take up to 3 business days. We ask that you follow-up with your pharmacy.

## 2017-09-16 NOTE — Progress Notes (Addendum)
[facility] [address], [address] [place], [place] [address] 336 [phone] [phone]  Date:  09/17/2017   Name:  Margaret CongressBrenda L Sims   DOB:  [birth year]   MRN:  [id number]  PCP:  Pearline Cablesopland, Aadarsh Cozort C, MD    Chief Complaint: No chief complaint on file.   History of Present Illness:  Margaret Sims is a 57 y.o. very pleasant female patient who presents with the following:  history of HTN, DM, hyperlipidemia Last seen in December with headaches.  Had a negative CT head at that time   Lab Results  Component Value Date   HGBA1C 6.3 05/19/2017   Here today with concern of tingling in her toes- first noted this maybe 8 months ago.  It has become more pronounced, and she feels like there is numbness as well more recently Also she will have numbness and tingling in her bilateral fingers, and the left hand only Her hand sx just appeared over the last couple of weeks - this is when she decided to come in She may notice some discomfort in her toes that will come and go  No difficulty speaking or swallowing, no other neurological sx noted  She does have mild DM, diet controlled  Her mother has history of neuropathy but she has severe, long term DM  Pap: done in 2014, never had an abnl Offered to do today but she would rather do another day   Lipids:    Component Value Date/Time   CHOL 177 05/19/2017 1615   TRIG 157.0 (H) 05/19/2017 1615   HDL 44.90 05/19/2017 1615   VLDL 31.4 05/19/2017 1615   CHOLHDL 4 05/19/2017 1615   BP Readings from Last 3 Encounters:  09/17/17 (!) 160/89  06/09/17 122/80  05/19/17 138/88   She is taking her lisinopril   Patient Active Problem List   Diagnosis Date Noted  . Diabetes type 2, controlled (HCC) 04/16/2016  . Essential hypertension 02/19/2016  . Dyslipidemia 02/19/2016  . Tobacco use disorder 02/19/2016    Past Medical History:  Diagnosis Date  . Acid reflux   . Diabetes type 2, controlled (HCC)  04/16/2016  . High cholesterol   . Hypertension     Past Surgical History:  Procedure Laterality Date  . CESAREAN SECTION      Social History   Tobacco Use  . Smoking status: Current Every Day Smoker    Types: Cigarettes  . Smokeless tobacco: Never Used  Substance Use Topics  . Alcohol use: Yes  . Drug use: Not on file    History reviewed. No pertinent family history.  No Known Allergies  Medication list has been reviewed and updated.  Current Outpatient Medications on File Prior to Visit  Medication Sig Dispense Refill  . aspirin 81 MG tablet Take 81 mg by mouth daily.    Marland Kitchen. atorvastatin (LIPITOR) 40 MG tablet Take 1 tablet (40 mg total) by mouth daily. 30 tablet 11  . lisinopril (PRINIVIL,ZESTRIL) 10 MG tablet Take 1 tablet (10 mg total) by mouth daily. 90 tablet 1  . omeprazole (PRILOSEC) 40 MG capsule Take 1 capsule (40 mg total) by mouth daily. 90 capsule 1  . SUMAtriptan (IMITREX) 50 MG tablet Take 1 if needed for headache, May repeat once in 2 hours if headache persists or recurs. 10 tablet 3   No current facility-administered medications on file prior to visit.     Review of Systems:  As per HPI-  otherwise negative.  No fever or chills Overall she is feeling well She does not check her BP at home   Physical Examination: Vitals:   09/17/17 1419  BP: (!) 160/89  Pulse: 83  Resp: 16  Temp: 97.7 F (36.5 C)  SpO2: 100%   Vitals:   09/17/17 1419  Weight: 166 lb 12.8 oz (75.7 kg)  Height: 5\' 6"  (1.676 m)   Body mass index is 26.92 kg/m. Ideal Body Weight: Weight in (lb) to have BMI = 25: 154.6  GEN: WDWN, NAD, Non-toxic, A & O x 3, normal weight, looks well  HEENT: Atraumatic, Normocephalic. Neck supple. No masses, No LAD.  Bilateral TM wnl, oropharynx normal.  PEERL,EOMI.   Ears and Nose: No external deformity. CV: RRR, No M/G/R. No JVD. No thrill. No extra heart sounds. PULM: CTA B, no wheezes, crackles, rhonchi. No retractions. No resp.  distress. No accessory muscle use. EXTR: No c/c/e NEURO Normal gait.  PSYCH: Normally interactive. Conversant. Not depressed or anxious appearing.  Calm demeanor.  Hands and feet display normal perfusion and pulses.  She has normal strength of all extremities.  Her hands and feet show normal sensation to palpation today No swelling or redness  No blue discoloation   Assessment and Plan: Controlled type 2 diabetes mellitus without complication, without long-term current use of insulin (HCC) - Plan: Hemoglobin A1c, Comprehensive metabolic panel  Pure hypercholesterolemia  Numbness of toes - Plan: Folate, B12, Ferritin  Numbness of fingers - Plan: Folate, B12, Ferritin  Essential hypertension  Her BP is up today- she will come back in one month for a BP check Labs pending- will look for any apparent cause of numbness.  May be due to DM, but as her disease is so mild I am not convinced of this.  Will refer to neurology if labs are normal    Signed Abbe Amsterdam, MD  Received her labs -all ok Gave her a call Will refer to neurology Copy of labs to pt Results for orders placed or performed in visit on 09/17/17  Hemoglobin A1c  Result Value Ref Range   Hgb A1c MFr Bld 6.6 (H) 4.6 - 6.5 %  Comprehensive metabolic panel  Result Value Ref Range   Sodium 140 135 - 145 mEq/L   Potassium 4.4 3.5 - 5.1 mEq/L   Chloride 107 96 - 112 mEq/L   CO2 30 19 - 32 mEq/L   Glucose, Bld 108 (H) 70 - 99 mg/dL   BUN 11 6 - 23 mg/dL   Creatinine, Ser 5.36 0.40 - 1.20 mg/dL   Total Bilirubin 0.3 0.2 - 1.2 mg/dL   Alkaline Phosphatase 86 39 - 117 U/L   AST 14 0 - 37 U/L   ALT 11 0 - 35 U/L   Total Protein 6.7 6.0 - 8.3 g/dL   Albumin 4.1 3.5 - 5.2 g/dL   Calcium 9.4 8.4 - 64.4 mg/dL   GFR 03.47 >42.59 mL/min  Folate  Result Value Ref Range   Folate 11.4 >5.9 ng/mL  B12  Result Value Ref Range   Vitamin B-12 337 211 - 911 pg/mL  Ferritin  Result Value Ref Range   Ferritin 33.3 10.0 -  291.0 ng/mL

## 2017-09-17 ENCOUNTER — Ambulatory Visit (INDEPENDENT_AMBULATORY_CARE_PROVIDER_SITE_OTHER): Admitting: Family Medicine

## 2017-09-17 ENCOUNTER — Encounter: Payer: Self-pay | Admitting: Family Medicine

## 2017-09-17 VITALS — BP 150/90 | HR 83 | Temp 97.7°F | Resp 16 | Ht 66.0 in | Wt 166.8 lb

## 2017-09-17 DIAGNOSIS — I10 Essential (primary) hypertension: Secondary | ICD-10-CM

## 2017-09-17 DIAGNOSIS — E7800 Pure hypercholesterolemia, unspecified: Secondary | ICD-10-CM | POA: Diagnosis not present

## 2017-09-17 DIAGNOSIS — R200 Anesthesia of skin: Secondary | ICD-10-CM | POA: Diagnosis not present

## 2017-09-17 DIAGNOSIS — E119 Type 2 diabetes mellitus without complications: Secondary | ICD-10-CM | POA: Diagnosis not present

## 2017-09-17 LAB — COMPREHENSIVE METABOLIC PANEL
ALBUMIN: 4.1 g/dL (ref 3.5–5.2)
ALT: 11 U/L (ref 0–35)
AST: 14 U/L (ref 0–37)
Alkaline Phosphatase: 86 U/L (ref 39–117)
BUN: 11 mg/dL (ref 6–23)
CALCIUM: 9.4 mg/dL (ref 8.4–10.5)
CO2: 30 mEq/L (ref 19–32)
CREATININE: 0.81 mg/dL (ref 0.40–1.20)
Chloride: 107 mEq/L (ref 96–112)
GFR: 93.48 mL/min (ref >60.00)
Glucose, Bld: 108 mg/dL — ABNORMAL HIGH (ref 70–99)
POTASSIUM: 4.4 meq/L (ref 3.5–5.1)
Sodium: 140 mEq/L (ref 135–145)
Total Bilirubin: 0.3 mg/dL (ref 0.2–1.2)
Total Protein: 6.7 g/dL (ref 6.0–8.3)

## 2017-09-17 LAB — FERRITIN: Ferritin: 33.3 ng/mL (ref 10.0–291.0)

## 2017-09-17 LAB — FOLATE: FOLATE: 11.4 ng/mL (ref >5.9)

## 2017-09-17 LAB — HEMOGLOBIN A1C: Hgb A1c MFr Bld: 6.6 % — ABNORMAL HIGH (ref 4.6–6.5)

## 2017-09-17 LAB — VITAMIN B12: VITAMIN B 12: 337 pg/mL (ref 211–911)

## 2017-09-17 NOTE — Addendum Note (Signed)
Addended by: Abbe AmsterdamOPLAND, JESSICA C on: 09/17/2017 08:57 PM   Modules accepted: Orders

## 2017-09-17 NOTE — Patient Instructions (Addendum)
We are going to check on some labs for you today- if these show a cause for your numbness we will treat for this!  However if your las look ok, I will plan to have you see neurology for further evaluation of your symptoms   Please do come and see me for a pap soon!   Please schedule a nurse visit for BP check in about one month

## 2017-09-19 ENCOUNTER — Telehealth: Payer: Self-pay | Admitting: Family Medicine

## 2017-09-19 NOTE — Telephone Encounter (Signed)
Copied from CRM [phone]. Topic: Quick Communication - See Telephone Encounter >> Sep 19, 2017  9:39 AM Lelon FrohlichGolden, Tashia, RMA wrote: CRM for notification. See Telephone encounter for:  09/19/17.

## 2017-10-22 ENCOUNTER — Ambulatory Visit: Payer: 59

## 2017-11-18 NOTE — Progress Notes (Addendum)
[facility] at [facility] [address], [address] [place], [place] [address] [phone]  Date:  11/19/2017   Name:  Margaret Sims   DOB:  1960-12-29   MRN:  [id number]  PCP:  Pearline Cables, MD    Chief Complaint: Diabetes (6 months)   History of Present Illness:  Margaret Sims is a 57 y.o. very pleasant female patient who presents with the following:  Periodic follow-up visit today History of diet controlled DM, HTN, dyslipidemia Last seen here in March:  Here today with concern of tingling in her toes- first noted this maybe 8 months ago.  It has become more pronounced, and she feels like there is numbness as well more recently Also she will have numbness and tingling in her bilateral fingers, and the left hand only Her hand sx just appeared over the last couple of weeks - this is when she decided to come in She may notice some discomfort in her toes that will come and go  No difficulty speaking or swallowing, no other neurological sx noted She does have mild DM, diet controlled   Pt notes that the numbness she had last time has really resolved - she only had one more time after our last visit She ended up not scheduling with neurology which is fine   Foot: do today Pap: will do for her today   Asa, lipitor, lisinopril  She quit smoking 4 weeks ago!  Congratulated her on this.  She is afraid of weight gain but counseled her that her weight is the same  Wt Readings from Last 3 Encounters:  11/19/17 166 lb (75.3 kg)  09/17/17 166 lb 12.8 oz (75.7 kg)  06/09/17 167 lb (75.8 kg)     Lab Results  Component Value Date   HGBA1C 6.6 (H) 09/17/2017     Patient Active Problem List   Diagnosis Date Noted  . Diabetes type 2, controlled (HCC) 04/16/2016  . Essential hypertension 02/19/2016  . Dyslipidemia 02/19/2016  . Tobacco use disorder 02/19/2016    Past Medical History:  Diagnosis Date  . Acid reflux   . Diabetes  type 2, controlled (HCC) 04/16/2016  . High cholesterol   . Hypertension     Past Surgical History:  Procedure Laterality Date  . CESAREAN SECTION      Social History   Tobacco Use  . Smoking status: Current Every Day Smoker    Types: Cigarettes  . Smokeless tobacco: Never Used  Substance Use Topics  . Alcohol use: Yes  . Drug use: Not on file    History reviewed. No pertinent family history.  No Known Allergies  Medication list has been reviewed and updated.  Current Outpatient Medications on File Prior to Visit  Medication Sig Dispense Refill  . aspirin 81 MG tablet Take 81 mg by mouth daily.    Marland Kitchen atorvastatin (LIPITOR) 40 MG tablet Take 1 tablet (40 mg total) by mouth daily. 30 tablet 11  . lisinopril (PRINIVIL,ZESTRIL) 10 MG tablet Take 1 tablet (10 mg total) by mouth daily. 90 tablet 1  . omeprazole (PRILOSEC) 40 MG capsule Take 1 capsule (40 mg total) by mouth daily. 90 capsule 1  . SUMAtriptan (IMITREX) 50 MG tablet Take 1 if needed for headache, May repeat once in 2 hours if headache persists or recurs. 10 tablet 3   No current facility-administered medications on file prior to visit.     Review of Systems:  As per HPI- otherwise negative.   Physical Examination: Vitals:   11/19/17 1401  BP: 118/80  Pulse: 83  Resp: 16  SpO2: 98%   Vitals:   11/19/17 1401  Weight: 166 lb (75.3 kg)  Height:  (1.676 m)   Body mass index is 26.79 kg/m. Ideal Body Weight: Weight in (lb) to have BMI = 25: 154.6  GEN: WDWN, NAD, Non-toxic, A & O x 3, looks well HEENT: Atraumatic, Normocephalic. Neck supple. No masses, No LAD. Ears and Nose: No external deformity. CV: RRR, No M/G/R. No JVD. No thrill. No extra heart sounds. PULM: CTA B, no wheezes, crackles, rhonchi. No retractions. No resp. distress. No accessory muscle use. ABD: S, NT, ND EXTR: No c/c/e NEURO Normal gait.  PSYCH: Normally interactive. Conversant. Not depressed or anxious appearing.  Calm  demeanor.  Pelvic: normal, no vaginal lesions or discharge. Uterus normal, no CMT, no adnexal tendereness or masses She does have a ?birthmark on her left buttock near the anus. Hyperpigmented macule about 1cm in diameter.   Mentioned this to pt- she is not sure if this has always been there or not     Assessment and Plan: Screening for cervical cancer - Plan: Cytology - PAP  Pure hypercholesterolemia  Controlled type 2 diabetes mellitus without complication, without long-term current use of insulin (HCC)  Numbness of toes  Essential hypertension  BP well controlled DM- recnet A1c ok, will plan to repeat in 6 months Keep up the good work with quitting smoking Pap pending She will ask her husband if the mark on her buttock is new- if so will refer to derm   Signed Abbe Amsterdam, MD  Received her pap Results for orders placed or performed in visit on 11/19/17  Cytology - PAP  Result Value Ref Range   Adequacy      Satisfactory for evaluation  endocervical/transformation zone component PRESENT.   Diagnosis      NEGATIVE FOR INTRAEPITHELIAL LESIONS OR MALIGNANCY.   HPV NOT DETECTED    Material Submitted CervicoVaginal Pap [ThinPrep Imaged]    CYTOLOGY - PAP PAP RESULT

## 2017-11-19 ENCOUNTER — Encounter: Payer: Self-pay | Admitting: Family Medicine

## 2017-11-19 ENCOUNTER — Other Ambulatory Visit (HOSPITAL_COMMUNITY)
Admission: RE | Admit: 2017-11-19 | Discharge: 2017-11-19 | Disposition: A | Discharge status: Home / Self Care | Payer: 59 | Source: Ambulatory Visit | Attending: Family Medicine | Admitting: Family Medicine

## 2017-11-19 ENCOUNTER — Ambulatory Visit (INDEPENDENT_AMBULATORY_CARE_PROVIDER_SITE_OTHER): Payer: 59 | Admitting: Family Medicine

## 2017-11-19 VITALS — BP 118/80 | HR 83 | Resp 16 | Ht 66.0 in | Wt 166.0 lb

## 2017-11-19 DIAGNOSIS — I10 Essential (primary) hypertension: Secondary | ICD-10-CM | POA: Diagnosis not present

## 2017-11-19 DIAGNOSIS — E119 Type 2 diabetes mellitus without complications: Secondary | ICD-10-CM | POA: Diagnosis not present

## 2017-11-19 DIAGNOSIS — Z124 Encounter for screening for malignant neoplasm of cervix: Secondary | ICD-10-CM | POA: Diagnosis not present

## 2017-11-19 DIAGNOSIS — Z87891 Personal history of nicotine dependence: Secondary | ICD-10-CM | POA: Insufficient documentation

## 2017-11-19 DIAGNOSIS — E7800 Pure hypercholesterolemia, unspecified: Secondary | ICD-10-CM

## 2017-11-19 DIAGNOSIS — R200 Anesthesia of skin: Secondary | ICD-10-CM | POA: Diagnosis not present

## 2017-11-19 DIAGNOSIS — E785 Hyperlipidemia, unspecified: Secondary | ICD-10-CM | POA: Diagnosis not present

## 2017-11-19 NOTE — Patient Instructions (Addendum)
Great to see you today- please come and see me in about 6 months [place] job so far with quitting smoking!    I'll be in touch with your pap asap  Please ask your husband about the skin making on your left buttock- if this is a new finding let me know

## 2017-11-20 LAB — CYTOLOGY - PAP
Diagnosis: NEGATIVE
HPV: NOT DETECTED

## 2018-02-25 ENCOUNTER — Telehealth: Payer: Self-pay | Admitting: Family Medicine

## 2018-02-25 DIAGNOSIS — E7800 Pure hypercholesterolemia, unspecified: Secondary | ICD-10-CM

## 2018-02-25 MED ORDER — ATORVASTATIN CALCIUM 40 MG PO TABS: 40.0000 mg | ORAL_TABLET | Freq: Every day | ORAL | 11 refills | Status: DC

## 2018-02-25 NOTE — Telephone Encounter (Signed)
Copied from CRM [id number]. Topic: Quick Communication - Rx Refill/Question >> Feb 25, 2018  1:11 PM Floria RavelingStovall, [name] wrote: Medication: atorvastatin (LIPITOR) 40 MG tablet [[id number]  Has the patient contacted their pharmacy? Yes  (Agent: If no, request that the patient contact the pharmacy for the refill.) (Agent: If yes, when and what did the pharmacy advise?)   [facility] [place], [place] - [address] [phone] (Phone)   Agent: Please be advised that RX refills may take up to 3 business days. We ask that you follow-up with your pharmacy.

## 2018-04-02 ENCOUNTER — Other Ambulatory Visit: Payer: Self-pay | Admitting: Family Medicine

## 2018-04-11 ENCOUNTER — Other Ambulatory Visit: Payer: Self-pay

## 2018-04-11 ENCOUNTER — Emergency Department (HOSPITAL_BASED_OUTPATIENT_CLINIC_OR_DEPARTMENT_OTHER)
Admission: EM | Admit: 2018-04-11 | Discharge: 2018-04-11 | Disposition: A | Discharge status: Home / Self Care | Payer: 59 | Attending: Emergency Medicine | Admitting: Emergency Medicine

## 2018-04-11 ENCOUNTER — Encounter (HOSPITAL_BASED_OUTPATIENT_CLINIC_OR_DEPARTMENT_OTHER): Payer: Self-pay | Admitting: *Deleted

## 2018-04-11 DIAGNOSIS — F17210 Nicotine dependence, cigarettes, uncomplicated: Secondary | ICD-10-CM | POA: Insufficient documentation

## 2018-04-11 DIAGNOSIS — Z79899 Other long term (current) drug therapy: Secondary | ICD-10-CM | POA: Insufficient documentation

## 2018-04-11 DIAGNOSIS — I10 Essential (primary) hypertension: Secondary | ICD-10-CM | POA: Diagnosis not present

## 2018-04-11 DIAGNOSIS — Y939 Activity, unspecified: Secondary | ICD-10-CM | POA: Insufficient documentation

## 2018-04-11 DIAGNOSIS — Y929 Unspecified place or not applicable: Secondary | ICD-10-CM | POA: Insufficient documentation

## 2018-04-11 DIAGNOSIS — X58XXXA Exposure to other specified factors, initial encounter: Secondary | ICD-10-CM | POA: Diagnosis not present

## 2018-04-11 DIAGNOSIS — Z7982 Long term (current) use of aspirin: Secondary | ICD-10-CM | POA: Insufficient documentation

## 2018-04-11 DIAGNOSIS — E119 Type 2 diabetes mellitus without complications: Secondary | ICD-10-CM | POA: Diagnosis not present

## 2018-04-11 DIAGNOSIS — Y999 Unspecified external cause status: Secondary | ICD-10-CM | POA: Insufficient documentation

## 2018-04-11 DIAGNOSIS — S199XXA Unspecified injury of neck, initial encounter: Secondary | ICD-10-CM | POA: Diagnosis present

## 2018-04-11 DIAGNOSIS — S161XXA Strain of muscle, fascia and tendon at neck level, initial encounter: Secondary | ICD-10-CM | POA: Diagnosis not present

## 2018-04-11 MED ORDER — DIAZEPAM 2 MG PO TABS
2.0000 mg | ORAL_TABLET | Freq: Once | ORAL | Status: AC
  Administered 2018-04-11: 2 mg via ORAL
  Filled 2018-04-11: qty 1

## 2018-04-11 MED ORDER — IBUPROFEN 600 MG PO TABS: 600.0000 mg | ORAL_TABLET | Freq: Four times a day (QID) | ORAL | 0 refills | Status: DC | PRN

## 2018-04-11 MED ORDER — DIAZEPAM 2 MG PO TABS: 2.0000 mg | ORAL_TABLET | Freq: Three times a day (TID) | ORAL | 0 refills | Status: DC | PRN

## 2018-04-11 NOTE — Discharge Instructions (Addendum)
Take ibuprofen every 6 hours as prescribed. Take Valium every 8 hours as needed for muscle spasm.  Do not drive or operate machinery while taking Valium.  Use heat 3-4 times daily alternating 20 minutes on, 20 minutes off.  After heating, attempt the stretches we discussed, holding each 30 seconds.  Do this 3 times daily.  Please follow-up with your doctor this week for recheck of your blood pressure.  Please return the emergency department if you develop any new or worsening symptoms.

## 2018-04-11 NOTE — ED Triage Notes (Signed)
Pt c/o left side neck pain radiating down her back x 1 day. Denies falls or injury

## 2018-04-11 NOTE — ED Provider Notes (Signed)
[facility] EMERGENCY DEPARTMENT Provider Note   CSN: [id number] Arrival date & time: 04/11/18  1927     History   Chief Complaint Chief Complaint  Patient presents with  . Neck Pain    HPI Margaret Sims is a 57 y.o. female with history of hypertension, diabetes, cholesterol who presents with a 2-day history of left-sided neck pain.  Patient reports her pain began while she was on a trip.  She notes that the pillows were really bad that she was sleeping on.  She reports that began around noon yesterday.  She has had tightness and pain with movement of her neck.  She has had intermittent shooting pain down her left arm and back.  She denies any headaches, numbness or tingling.  She reports taking Tylenol without any relief, but did have relief from ibuprofen.  She denies any chest pain, shortness of breath, abdominal pain, nausea, vomiting.  HPI  Past Medical History:  Diagnosis Date  . Acid reflux   . Diabetes type 2, controlled (HCC) 04/16/2016  . High cholesterol   . Hypertension     Patient Active Problem List   Diagnosis Date Noted  . Diabetes type 2, controlled (HCC) 04/16/2016  . Essential hypertension 02/19/2016  . Dyslipidemia 02/19/2016  . Tobacco use disorder 02/19/2016    Past Surgical History:  Procedure Laterality Date  . CESAREAN SECTION       OB History   None      Home Medications    Prior to Admission medications   Medication Sig Start Date End Date Taking? Authorizing Provider  aspirin 81 MG tablet Take 81 mg by mouth daily.   Yes [provider]  atorvastatin (LIPITOR) 40 MG tablet Take 1 tablet (40 mg total) by mouth daily. 02/25/18  Yes Copland, [name], MD  lisinopril (PRINIVIL,ZESTRIL) 10 MG tablet TAKE ONE (1) TABLET BY MOUTH EVERY DAY 04/02/18  Yes Copland, [name], MD  omeprazole (PRILOSEC) 40 MG capsule TAKE ONE CAPSULE BY MOUTH DAILY 04/02/18  Yes Copland, [name], MD  diazepam (VALIUM) 2 MG tablet Take 1  tablet (2 mg total) by mouth every 8 (eight) hours as needed for anxiety. 04/11/18   Sanad Fearnow, Waylan Boga, PA-C  ibuprofen (ADVIL,MOTRIN) 600 MG tablet Take 1 tablet (600 mg total) by mouth every 6 (six) hours as needed. 04/11/18   Emi Holes, PA-C  SUMAtriptan (IMITREX) 50 MG tablet Take 1 if needed for headache, May repeat once in 2 hours if headache persists or recurs. 06/09/17   Copland, [name], MD    Family History No family history on file.  Social History Social History   Tobacco Use  . Smoking status: Current Every Day Smoker    Types: Cigarettes  . Smokeless tobacco: Never Used  Substance Use Topics  . Alcohol use: Yes  . Drug use: Never     Allergies   Patient has no known allergies.   Review of Systems Review of Systems  Constitutional: Negative for fever.  Respiratory: Negative for shortness of breath.   Cardiovascular: Negative for chest pain.  Gastrointestinal: Negative for abdominal pain, nausea and vomiting.  Musculoskeletal: Positive for back pain, myalgias, neck pain and neck stiffness.  Neurological: Negative for numbness.     Physical Exam Updated Vital Signs BP (!) 171/100 Comment: Checked twice with two different cuffs.   Pulse 70   Temp 98.2 F (36.8 C) (Oral)   Resp 18   Ht 5\' 7"  (1.702 m)  Wt 77.1 kg   SpO2 100%   BMI 26.63 kg/m   Physical Exam  Constitutional: She appears well-developed and well-nourished. No distress.  HENT:  Head: Normocephalic and atraumatic.  Mouth/Throat: Oropharynx is clear and moist. No oropharyngeal exudate.  Eyes: Pupils are equal, round, and reactive to light. Conjunctivae are normal. Right eye exhibits no discharge. Left eye exhibits no discharge. No scleral icterus.  Neck: Normal range of motion. Neck supple. No thyromegaly present.    Tenderness tightness to bilateral paraspinal muscles of the cervical spine, upper trapezius muscles, and left sternocleidomastoid, which is in spasm  Cardiovascular:  Normal rate, regular rhythm, normal heart sounds and intact distal pulses. Exam reveals no gallop and no friction rub.  No murmur heard. Pulmonary/Chest: Effort normal and breath sounds normal. No stridor. No respiratory distress. She has no wheezes. She has no rales.  Abdominal: Soft. Bowel sounds are normal. She exhibits no distension. There is no tenderness. There is no rebound and no guarding.  Musculoskeletal: She exhibits no edema.  Lymphadenopathy:    She has no cervical adenopathy.  Neurological: She is alert. Coordination normal.  Equal bilateral grip strength, normal sensation bilaterally, 5/5 strength to bilateral upper extremities  Skin: Skin is warm and dry. No rash noted. She is not diaphoretic. No pallor.  Psychiatric: She has a normal mood and affect.  Nursing note and vitals reviewed.    ED Treatments / Results  Labs (all labs ordered are listed, but only abnormal results are displayed) Labs Reviewed - No data to display  EKG None  Radiology No results found.  Procedures Procedures (including critical care time)  Medications Ordered in ED Medications  diazepam (VALIUM) tablet 2 mg (2 mg Oral Given 04/11/18 2006)     Initial Impression / Assessment and Plan / ED Course  I have reviewed the triage vital signs and the nursing notes.  Pertinent labs & imaging results that were available during my care of the patient were reviewed by me and considered in my medical decision making (see chart for details).     Patient presenting with suspected cervical spasm and strain, most likely from the different pillows she was sleeping on during her trip.  Will treat supportively with NSAIDs, and low-dose Valium for muscle spasms.  I reviewed the [place] controlled substance database and found no discrepancies.  Recommended stretching and heat.  Follow-up to PCP as needed.  Patient's blood pressure noted to be elevated in the ED.  I advised to follow-up for recheck at PCP.  Return  precautions discussed.  Patient understands and agrees with plan.  Patient discharged in satisfactory condition.  Final Clinical Impressions(s) / ED Diagnoses   Final diagnoses:  Acute strain of neck muscle, initial encounter    ED Discharge Orders         Ordered    ibuprofen (ADVIL,MOTRIN) 600 MG tablet  Every 6 hours PRN     04/11/18 2019    diazepam (VALIUM) 2 MG tablet  Every 8 hours PRN     04/11/18 2019           Emi Holes, PA-C 04/11/18 2023    Gwyneth Sprout, MD 04/12/18 2156

## 2018-05-19 NOTE — Progress Notes (Addendum)
[facility] at [facility] [address] [place], [place] [address] [phone]  Date:  05/20/2018   Name:  Margaret Sims   DOB:  05/28/1961   MRN:  [id number]  PCP:  Pearline Cables, MD    Chief Complaint: Diabetes (6 month follow) and Hypertension (blood pressure been running high at home)   History of Present Illness:  Margaret Sims is a 57 y.o. very pleasant female patient who presents with the following:  Here today for a periodic diabetes recheck History of diet controlled DM, HTN, dyslipidemia, smoking She had recently been sick with a virus, went to UC on 11/5, treated symptomatically with ibuprofen and tussionex  She has been sick for about 10 days now, does feel like she is on the right track however  She was negative for the flu  She is better but does still have some cough She did have a fever- her last fever was about 5 days ago She prefers to wait on her flu shot - will do once fully recovered   Her BP has been high- at [facility] and again in the ER back in October   BP Readings from Last 3 Encounters:  05/20/18 (!) 142/100  04/11/18 (!) 171/100  11/19/17 118/80    ?pack years- smoking for about 40 years, max 1/2 PPD= 20 pack years, does not qualify for screening   I last saw her in May at which time she had quit smoking!  However she did go back to smoking again recently   Lab Results  Component Value Date   HGBA1C 6.7 (H) 05/20/2018  eye exam: she is due, she will set this up  Labs are due Flu:  She plans to do this later    Patient Active Problem List   Diagnosis Date Noted  . Diabetes type 2, controlled (HCC) 04/16/2016  . Essential hypertension 02/19/2016  . Dyslipidemia 02/19/2016  . Tobacco use disorder 02/19/2016    Past Medical History:  Diagnosis Date  . Acid reflux   . Diabetes type 2, controlled (HCC) 04/16/2016  . High cholesterol   . Hypertension     Past Surgical History:   Procedure Laterality Date  . CESAREAN SECTION      Social History   Tobacco Use  . Smoking status: Current Every Day Smoker    Types: Cigarettes  . Smokeless tobacco: Never Used  Substance Use Topics  . Alcohol use: Yes  . Drug use: Never    History reviewed. No pertinent family history.  No Known Allergies  Medication list has been reviewed and updated.  Current Outpatient Medications on File Prior to Visit  Medication Sig Dispense Refill  . aspirin 81 MG tablet Take 81 mg by mouth daily.    Marland Kitchen atorvastatin (LIPITOR) 40 MG tablet Take 1 tablet (40 mg total) by mouth daily. 30 tablet 11  . omeprazole (PRILOSEC) 40 MG capsule TAKE ONE CAPSULE BY MOUTH DAILY 90 capsule 1   No current facility-administered medications on file prior to visit.     Review of Systems:  As per HPI- otherwise negative. No current fever or chills No CP orSOB  She is able to monitor herBP at home   Physical Examination: Vitals:   05/20/18 1409 05/20/18 1424  BP: (!) 150/92 (!) 142/100  Pulse: (!) 102   Resp: 16   Temp: 97.8 F (36.6 C)   SpO2: 99%    Vitals:  05/20/18 1409  Weight: 169 lb 3.2 oz (76.7 kg)  Height: 5\' 7"  (1.702 m)   Body mass index is 26.5 kg/m. Ideal Body Weight: Weight in (lb) to have BMI = 25: 159.3  GEN: WDWN, NAD, Non-toxic, A & O x 3, minimal overweight, looks well  HEENT: Atraumatic, Normocephalic. Neck supple. No masses, No LAD. Bilateral TM wnl, oropharynx normal.  PEERL,EOMI.   Ears and Nose: No external deformity. CV: RRR, No M/G/R. No JVD. No thrill. No extra heart sounds. PULM: CTA B, no wheezes, crackles, rhonchi. No retractions. No resp. distress. No accessory muscle use. ABD: S, NT, ND EXTR: No c/c/e NEURO Normal gait.  PSYCH: Normally interactive. Conversant. Not depressed or anxious appearing.  Calm demeanor.    Assessment and Plan: Pure hypercholesterolemia - Plan: Lipid panel  Controlled type 2 diabetes mellitus without complication,  without long-term current use of insulin (HCC) - Plan: Comprehensive metabolic panel, Hemoglobin A1c, Microalbumin / creatinine urine ratio  Essential hypertension - Plan: CBC, lisinopril (PRINIVIL,ZESTRIL) 20 MG tablet  Cough - Plan: azithromycin (ZITHROMAX) 250 MG tablet  Needs flu shot  Tobacco use disorder  Following up today Labs pending as above Increase lisinopril to 20 mg a day Gave her a paper rx for zpack to hold   Will plan further follow- up pending labs. Plan to recheck here in about 6 months  Signed Abbe Amsterdam, MD  Received her labs- letter to pt  Results for orders placed or performed in visit on 05/20/18  CBC  Result Value Ref Range   WBC 7.7 4.0 - 10.5 K/uL   RBC 4.65 3.87 - 5.11 Mil/uL   Platelets 277.0 150.0 - 400.0 K/uL   Hemoglobin 14.5 12.0 - 15.0 g/dL   HCT 96.0 45.4 - 09.8 %   MCV 93.9 78.0 - 100.0 fl   MCHC 33.2 30.0 - 36.0 g/dL   RDW 11.9 14.7 - 82.9 %  Comprehensive metabolic panel  Result Value Ref Range   Sodium 143 135 - 145 mEq/L   Potassium 3.6 3.5 - 5.1 mEq/L   Chloride 105 96 - 112 mEq/L   CO2 33 (H) 19 - 32 mEq/L   Glucose, Bld 96 70 - 99 mg/dL   BUN 10 6 - 23 mg/dL   Creatinine, Ser 5.62 0.40 - 1.20 mg/dL   Total Bilirubin 0.4 0.2 - 1.2 mg/dL   Alkaline Phosphatase 88 39 - 117 U/L   AST 14 0 - 37 U/L   ALT 9 0 - 35 U/L   Total Protein 7.1 6.0 - 8.3 g/dL   Albumin 4.6 3.5 - 5.2 g/dL   Calcium 9.7 8.4 - 13.0 mg/dL   GFR 86.57 >84.69 mL/min  Hemoglobin A1c  Result Value Ref Range   Hgb A1c MFr Bld 6.7 (H) 4.6 - 6.5 %  Lipid panel  Result Value Ref Range   Cholesterol 180 0 - 200 mg/dL   Triglycerides 629.5 0.0 - 149.0 mg/dL   HDL 28.41 (L) >32.44 mg/dL   VLDL 01.0 0.0 - 27.2 mg/dL   LDL Cholesterol 536 (H) 0 - 99 mg/dL   Total CHOL/HDL Ratio 5    NonHDL 142.67   Microalbumin / creatinine urine ratio  Result Value Ref Range   Microalb, Ur 5.3 (H) 0.0 - 1.9 mg/dL   Creatinine,U 644.0 mg/dL   Microalb Creat Ratio 2.1  0.0 - 30.0 mg/g   Also received her micro albumin 11/14-

## 2018-05-20 ENCOUNTER — Encounter: Payer: Self-pay | Admitting: Family Medicine

## 2018-05-20 ENCOUNTER — Ambulatory Visit (INDEPENDENT_AMBULATORY_CARE_PROVIDER_SITE_OTHER): Payer: 59 | Admitting: Family Medicine

## 2018-05-20 VITALS — BP 142/100 | HR 102 | Temp 97.8°F | Resp 16 | Ht 67.0 in | Wt 169.2 lb

## 2018-05-20 DIAGNOSIS — R05 Cough: Secondary | ICD-10-CM | POA: Diagnosis not present

## 2018-05-20 DIAGNOSIS — E7800 Pure hypercholesterolemia, unspecified: Secondary | ICD-10-CM | POA: Diagnosis not present

## 2018-05-20 DIAGNOSIS — Z23 Encounter for immunization: Secondary | ICD-10-CM

## 2018-05-20 DIAGNOSIS — I10 Essential (primary) hypertension: Secondary | ICD-10-CM

## 2018-05-20 DIAGNOSIS — E119 Type 2 diabetes mellitus without complications: Secondary | ICD-10-CM | POA: Diagnosis not present

## 2018-05-20 DIAGNOSIS — F17200 Nicotine dependence, unspecified, uncomplicated: Secondary | ICD-10-CM

## 2018-05-20 DIAGNOSIS — R059 Cough, unspecified: Secondary | ICD-10-CM

## 2018-05-20 LAB — LIPID PANEL
Cholesterol: 180 mg/dL (ref 0–200)
HDL: 37.6 mg/dL — AB (ref >39.00)
LDL CALC: 119 mg/dL — AB (ref 0–99)
NonHDL: 142.67
Total CHOL/HDL Ratio: 5
Triglycerides: 116 mg/dL (ref 0.0–149.0)
VLDL: 23.2 mg/dL (ref 0.0–40.0)

## 2018-05-20 LAB — COMPREHENSIVE METABOLIC PANEL
ALT: 9 U/L (ref 0–35)
AST: 14 U/L (ref 0–37)
Albumin: 4.6 g/dL (ref 3.5–5.2)
Alkaline Phosphatase: 88 U/L (ref 39–117)
BUN: 10 mg/dL (ref 6–23)
CHLORIDE: 105 meq/L (ref 96–112)
CO2: 33 meq/L — AB (ref 19–32)
Calcium: 9.7 mg/dL (ref 8.4–10.5)
Creatinine, Ser: 0.98 mg/dL (ref 0.40–1.20)
GFR: 74.85 mL/min (ref >60.00)
GLUCOSE: 96 mg/dL (ref 70–99)
POTASSIUM: 3.6 meq/L (ref 3.5–5.1)
SODIUM: 143 meq/L (ref 135–145)
Total Bilirubin: 0.4 mg/dL (ref 0.2–1.2)
Total Protein: 7.1 g/dL (ref 6.0–8.3)

## 2018-05-20 LAB — CBC
HCT: 43.7 % (ref 36.0–46.0)
Hemoglobin: 14.5 g/dL (ref 12.0–15.0)
MCHC: 33.2 g/dL (ref 30.0–36.0)
MCV: 93.9 fl (ref 78.0–100.0)
Platelets: 277 10*3/uL (ref 150.0–400.0)
RBC: 4.65 Mil/uL (ref 3.87–5.11)
RDW: 13.5 % (ref 11.5–15.5)
WBC: 7.7 10*3/uL (ref 4.0–10.5)

## 2018-05-20 LAB — HEMOGLOBIN A1C: HEMOGLOBIN A1C: 6.7 % — AB (ref 4.6–6.5)

## 2018-05-20 MED ORDER — AZITHROMYCIN 250 MG PO TABS: ORAL_TABLET | ORAL | 0 refills | Status: DC

## 2018-05-20 MED ORDER — LISINOPRIL 20 MG PO TABS: 20.0000 mg | ORAL_TABLET | Freq: Every day | ORAL | 3 refills | Status: DC

## 2018-05-20 NOTE — Patient Instructions (Addendum)
Good to see you today!  Let's increase your lisinopril to 20mg   I will be in touch with your other labs Get your flu shot once you are feeling well I think you likely have a viral infection and will continue toget better   However, if you start to get worse again you can fill and use the z-pack rx that I gave you today

## 2018-05-21 LAB — MICROALBUMIN / CREATININE URINE RATIO
Creatinine,U: 255.9 mg/dL
MICROALB UR: 5.3 mg/dL — AB (ref 0.0–1.9)
Microalb Creat Ratio: 2.1 mg/g (ref 0.0–30.0)

## 2018-08-01 NOTE — Progress Notes (Signed)
[facility] [address], [address] [place], [place] [address] [phone]  Date:  08/03/2018   Name:  Margaret Sims   DOB:  February 03, 1961   MRN:  [id number]  PCP:  Pearline Cables, MD    Chief Complaint: Neck Pain (started end of november, feels like something is popping,  left side neck pain, numbness on left side, no energy)   History of Present Illness:  Margaret Sims is a 58 y.o. very pleasant female patient who presents with the following:  History of hypertension, diabetes, dyslipidemia, smoker She was seen in the emergency room in October with neck strain Most recent labs done in November  Flu shot: Eye exam is due  Today she notes that the pain in her neck and shoulders - from the ER in October- seems to have come back over the last couple of months- However this is somewhat different- tends to radiate into her left arm and hand, and her left hand can go numb  She will sometimes feel a "pumping" in her left chest.  This occurs at least once a day- it lasts for just a moment.  It is difficult for her to further define his feeling, but she does admit to discomfort She notes SOB right now but thinks this is due to a current illness-she also seems to have caught a cold over the last 4 days or so She has noted a cough, chest congestion for the last several days No fever noted   No vomiting She may cough until she nearly vomits but otherwise no GI symptoms  She does not have any cardiac history  Her father did have an MI at age 92  She did do a treadmill she thinks several years ago and it looked okay No imaging of her neck done in the ER   Her diabetes has been under good control Lab Results  Component Value Date   HGBA1C 6.7 (H) 05/20/2018      Patient Active Problem List   Diagnosis Date Noted  . Diabetes type 2, controlled (HCC) 04/16/2016  . Essential hypertension 02/19/2016  . Dyslipidemia  02/19/2016  . Tobacco use disorder 02/19/2016    Past Medical History:  Diagnosis Date  . Acid reflux   . Diabetes type 2, controlled (HCC) 04/16/2016  . High cholesterol   . Hypertension     Past Surgical History:  Procedure Laterality Date  . CESAREAN SECTION      Social History   Tobacco Use  . Smoking status: Current Every Day Smoker    Types: Cigarettes  . Smokeless tobacco: Never Used  Substance Use Topics  . Alcohol use: Yes  . Drug use: Never    No family history on file.  No Known Allergies  Medication list has been reviewed and updated.  Current Outpatient Medications on File Prior to Visit  Medication Sig Dispense Refill  . aspirin 81 MG tablet Take 81 mg by mouth daily.    Marland Kitchen atorvastatin (LIPITOR) 40 MG tablet Take 1 tablet (40 mg total) by mouth daily. 30 tablet 11  . lisinopril (PRINIVIL,ZESTRIL) 20 MG tablet Take 1 tablet (20 mg total) by mouth daily. 90 tablet 3  . omeprazole (PRILOSEC) 40 MG capsule TAKE ONE CAPSULE BY MOUTH DAILY 90 capsule 1   No current facility-administered medications on file prior to visit.     Review of Systems:  As per HPI- otherwise negative. No  fever  Physical Examination: Vitals:   08/03/18 1443  BP: 126/84  Pulse: 89  Temp: 98.1 F (36.7 C)  SpO2: 99%   Vitals:   08/03/18 1443  Weight: 165 lb (74.8 kg)  Height: 5\' 7"  (1.702 m)   Body mass index is 25.84 kg/m. Ideal Body Weight: Weight in (lb) to have BMI = 25: 159.3  GEN: WDWN, NAD, Non-toxic, A & O x 3, normal weight, looks well HEENT: Atraumatic, Normocephalic. Neck supple. No masses, No LAD.  Bilateral TM wnl, oropharynx normal.  PEERL,EOMI.   Ears and Nose: No external deformity. CV: RRR, No M/G/R. No JVD. No thrill. No extra heart sounds. PULM: CTA B, no wheezes, crackles, rhonchi. No retractions. No resp. distress. No accessory muscle use. EXTR: No c/c/e NEURO Normal gait.  PSYCH: Normally interactive. Conversant. Not depressed or anxious  appearing.  Calm demeanor. She has tenderness on the medial aspect of her left upper arm, this is worse with extending the arm overhead Normal strength and deep tendon reflexes of both upper extremities No cervical spine tenderness, normal range of motion of the cervical spine  Results for orders placed or performed in visit on 08/03/18  POCT Influenza A/B  Result Value Ref Range   Influenza A, POC Negative Negative   Influenza B, POC Negative Negative   EKG: Sinus rhythm with LVH, I do not see an old EKG for comparison Assessment and Plan: Left arm pain - Plan: EKG 12-Lead  Cough - Plan: POCT Influenza A/B  Malaise - Plan: POCT Influenza A/B  Chest discomfort  Here today with a couple of concerns.  She seems to have caught a virus, flu is negative today.  She is noted a cough, some shortness of breath for last several days.  However she also has concern of pain and numbness rating to her left arm for the last 2 months, and of a vague chest discomfort.  She describes this as a "pumping" feeling, and can also have palpitations.  This is present right now  She has diabetes and hypertension, is a smoker, and has a poor family history.  Decided to have her go to the ER for further evaluation of chest discomfort now.  Assuming she is able to go home, we will set up a stress test in the near future.  I will go ahead and order this  Signed Abbe AmsterdamJessica , MD

## 2018-08-03 ENCOUNTER — Emergency Department (HOSPITAL_BASED_OUTPATIENT_CLINIC_OR_DEPARTMENT_OTHER): Payer: 59

## 2018-08-03 ENCOUNTER — Ambulatory Visit (INDEPENDENT_AMBULATORY_CARE_PROVIDER_SITE_OTHER): Payer: 59 | Admitting: Family Medicine

## 2018-08-03 ENCOUNTER — Emergency Department (HOSPITAL_BASED_OUTPATIENT_CLINIC_OR_DEPARTMENT_OTHER)
Admission: EM | Admit: 2018-08-03 | Discharge: 2018-08-03 | Disposition: A | Discharge status: Home / Self Care | Payer: 59 | Attending: Emergency Medicine | Admitting: Emergency Medicine

## 2018-08-03 ENCOUNTER — Other Ambulatory Visit: Payer: Self-pay

## 2018-08-03 ENCOUNTER — Encounter (HOSPITAL_BASED_OUTPATIENT_CLINIC_OR_DEPARTMENT_OTHER): Payer: Self-pay | Admitting: *Deleted

## 2018-08-03 ENCOUNTER — Encounter: Payer: Self-pay | Admitting: Family Medicine

## 2018-08-03 VITALS — BP 126/84 | HR 89 | Temp 98.1°F | Ht 67.0 in | Wt 165.0 lb

## 2018-08-03 DIAGNOSIS — R5381 Other malaise: Secondary | ICD-10-CM

## 2018-08-03 DIAGNOSIS — M79602 Pain in left arm: Secondary | ICD-10-CM | POA: Diagnosis not present

## 2018-08-03 DIAGNOSIS — R0789 Other chest pain: Secondary | ICD-10-CM | POA: Diagnosis not present

## 2018-08-03 DIAGNOSIS — Z79899 Other long term (current) drug therapy: Secondary | ICD-10-CM | POA: Diagnosis not present

## 2018-08-03 DIAGNOSIS — R05 Cough: Secondary | ICD-10-CM | POA: Diagnosis not present

## 2018-08-03 DIAGNOSIS — E119 Type 2 diabetes mellitus without complications: Secondary | ICD-10-CM | POA: Insufficient documentation

## 2018-08-03 DIAGNOSIS — Z7982 Long term (current) use of aspirin: Secondary | ICD-10-CM | POA: Diagnosis not present

## 2018-08-03 DIAGNOSIS — F17210 Nicotine dependence, cigarettes, uncomplicated: Secondary | ICD-10-CM | POA: Diagnosis not present

## 2018-08-03 DIAGNOSIS — R079 Chest pain, unspecified: Secondary | ICD-10-CM | POA: Diagnosis present

## 2018-08-03 DIAGNOSIS — I10 Essential (primary) hypertension: Secondary | ICD-10-CM | POA: Insufficient documentation

## 2018-08-03 DIAGNOSIS — R059 Cough, unspecified: Secondary | ICD-10-CM

## 2018-08-03 LAB — CBC
HCT: 48.1 % — ABNORMAL HIGH (ref 36.0–46.0)
Hemoglobin: 14.9 g/dL (ref 12.0–15.0)
MCH: 30.7 pg (ref 26.0–34.0)
MCHC: 31 g/dL (ref 30.0–36.0)
MCV: 99 fL (ref 80.0–100.0)
Platelets: 225 10*3/uL (ref 150–400)
RBC: 4.86 MIL/uL (ref 3.87–5.11)
RDW: 13.6 % (ref 11.5–15.5)
WBC: 6.7 10*3/uL (ref 4.0–10.5)
nRBC: 0 % (ref 0.0–0.2)

## 2018-08-03 LAB — BASIC METABOLIC PANEL
ANION GAP: 9 (ref 5–15)
BUN: 13 mg/dL (ref 6–20)
CALCIUM: 9.6 mg/dL (ref 8.9–10.3)
CO2: 27 mmol/L (ref 22–32)
Chloride: 103 mmol/L (ref 98–111)
Creatinine, Ser: 0.98 mg/dL (ref 0.44–1.00)
GFR calc Af Amer: >60 mL/min (ref >60)
GLUCOSE: 110 mg/dL — AB (ref 70–99)
Potassium: 3.6 mmol/L (ref 3.5–5.1)
Sodium: 139 mmol/L (ref 135–145)

## 2018-08-03 LAB — TROPONIN I

## 2018-08-03 LAB — POCT INFLUENZA A/B
INFLUENZA A, POC: NEGATIVE
INFLUENZA B, POC: NEGATIVE

## 2018-08-03 MED ORDER — SODIUM CHLORIDE 0.9% FLUSH
3.0000 mL | Freq: Once | INTRAVENOUS | Status: DC
  Filled 2018-08-03: qty 3

## 2018-08-03 MED ORDER — LIDOCAINE 5 % EX PTCH: 1.0000 | MEDICATED_PATCH | CUTANEOUS | 0 refills | Status: AC

## 2018-08-03 MED ORDER — CYCLOBENZAPRINE HCL 10 MG PO TABS: 10.0000 mg | ORAL_TABLET | Freq: Three times a day (TID) | ORAL | 0 refills | Status: DC | PRN

## 2018-08-03 MED FILL — LIDOCAINE PATCH 5%: 5 | 30 days supply | Qty: 30 | Fill #0

## 2018-08-03 MED FILL — CYCLOBENZAPRINE HCL 10 MG T: 10 | 5 days supply | Qty: 15 | Fill #0

## 2018-08-03 NOTE — ED Triage Notes (Signed)
She was seen upstairs for chest pain on and off for months. EKG repeated at triage.

## 2018-08-03 NOTE — Discharge Instructions (Signed)
Use Tylenol, Motrin as discussed.  Lidocaine patch, Flexeril as prescribed.  Follow-up with your primary care doctor for stress test.  Return to the ED if symptoms change.

## 2018-08-03 NOTE — ED Provider Notes (Signed)
[facility] EMERGENCY DEPARTMENT Provider Note   CSN: [id number] Arrival date & time: 08/03/18  1534     History   Chief Complaint Chief Complaint  Patient presents with  . Chest Pain    HPI Graceann CongressBrenda L Bauch is a 58 y.o. female.  The history is provided by the patient.  Chest Pain  Pain location:  L chest Pain quality: aching and dull   Pain radiates to:  Does not radiate Pain severity:  Mild Onset quality:  Gradual Timing:  Intermittent Progression:  Waxing and waning Chronicity:  Recurrent Context: movement, raising an arm and at rest   Relieved by: flexeril. Worsened by:  Nothing Associated symptoms: no abdominal pain, no anxiety, no back pain, no cough, no dizziness, no dysphagia, no fever, no headache, no heartburn, no lower extremity edema, no orthopnea, no palpitations, no shortness of breath, no syncope, no vomiting and no weakness   Risk factors: high cholesterol and hypertension   Risk factors: no diabetes mellitus (no longer on treatment)     Past Medical History:  Diagnosis Date  . Acid reflux   . Diabetes type 2, controlled (HCC) 04/16/2016  . High cholesterol   . Hypertension     Patient Active Problem List   Diagnosis Date Noted  . Diabetes type 2, controlled (HCC) 04/16/2016  . Essential hypertension 02/19/2016  . Dyslipidemia 02/19/2016  . Tobacco use disorder 02/19/2016    Past Surgical History:  Procedure Laterality Date  . CESAREAN SECTION       OB History   No obstetric history on file.      Home Medications    Prior to Admission medications   Medication Sig Start Date End Date Taking? Authorizing Provider  aspirin 81 MG tablet Take 81 mg by mouth daily.    [provider]  atorvastatin (LIPITOR) 40 MG tablet Take 1 tablet (40 mg total) by mouth daily. 02/25/18   Copland, Gwenlyn FoundJessica C, MD  cyclobenzaprine (FLEXERIL) 10 MG tablet Take 1 tablet (10 mg total) by mouth 3 (three) times daily as needed for up to 15  doses for muscle spasms. 08/03/18   Geovanie Winnett, DO  lidocaine (LIDODERM) 5 % Place 1 patch onto the skin daily for 30 doses. Remove & Discard patch within 12 hours or as directed by MD 08/03/18 09/02/18  Virgina Norfolkuratolo, Josalin Carneiro, DO  lisinopril (PRINIVIL,ZESTRIL) 20 MG tablet Take 1 tablet (20 mg total) by mouth daily. 05/20/18   Copland, Gwenlyn FoundJessica C, MD  omeprazole (PRILOSEC) 40 MG capsule TAKE ONE CAPSULE BY MOUTH DAILY 04/02/18   Copland, Gwenlyn FoundJessica C, MD    Family History No family history on file.  Social History Social History   Tobacco Use  . Smoking status: Current Every Day Smoker    Types: Cigarettes  . Smokeless tobacco: Never Used  Substance Use Topics  . Alcohol use: Yes  . Drug use: Never     Allergies   Patient has no known allergies.   Review of Systems Review of Systems  Constitutional: Negative for chills and fever.  HENT: Negative for ear pain, sore throat and trouble swallowing.   Eyes: Negative for pain and visual disturbance.  Respiratory: Negative for cough and shortness of breath.   Cardiovascular: Positive for chest pain. Negative for palpitations, orthopnea and syncope.  Gastrointestinal: Negative for abdominal pain, heartburn and vomiting.  Genitourinary: Negative for dysuria and hematuria.  Musculoskeletal: Positive for arthralgias, myalgias and neck pain. Negative for back pain.  Skin: Negative for color  change and rash.  Neurological: Negative for dizziness, tremors, seizures, syncope, weakness and headaches.  All other systems reviewed and are negative.    Physical Exam Updated Vital Signs BP (!) 137/102 (BP Location: Right Arm)   Pulse (!) 102   Temp 98.1 F (36.7 C) (Oral)   Resp 18   SpO2 100%   Physical Exam Vitals signs and nursing note reviewed.  Constitutional:      General: She is not in acute distress.    Appearance: She is well-developed.  HENT:     Head: Normocephalic and atraumatic.  Eyes:     Conjunctiva/sclera: Conjunctivae  normal.     Pupils: Pupils are equal, round, and reactive to light.  Neck:     Musculoskeletal: Normal range of motion and neck supple.     Thyroid: No thyromegaly.  Cardiovascular:     Rate and Rhythm: Normal rate and regular rhythm.     Pulses:          Radial pulses are 2+ on the right side and 2+ on the left side.     Heart sounds: No murmur.  Pulmonary:     Effort: Pulmonary effort is normal. No respiratory distress.     Breath sounds: Normal breath sounds. No decreased breath sounds, wheezing, rhonchi or rales.  Abdominal:     Palpations: Abdomen is soft.     Tenderness: There is no abdominal tenderness.  Musculoskeletal: Normal range of motion.     Right lower leg: She exhibits no tenderness.     Left lower leg: She exhibits no tenderness.     Comments: Patient with tenderness over the left anterior chest wall, left bicep area, no midline spinal tenderness  Skin:    General: Skin is warm and dry.     Capillary Refill: Capillary refill takes less than 2 seconds.  Neurological:     General: No focal deficit present.     Mental Status: She is alert and oriented to person, place, and time.     Cranial Nerves: No cranial nerve deficit.     Motor: No weakness.     Comments: 5+ out of 5 strength throughout, normal sensation throughout, no drift  Psychiatric:        Mood and Affect: Mood normal.      ED Treatments / Results  Labs (all labs ordered are listed, but only abnormal results are displayed) Labs Reviewed  BASIC METABOLIC PANEL - Abnormal; Notable for the following components:      Result Value   Glucose, Bld 110 (*)    All other components within normal limits  CBC - Abnormal; Notable for the following components:   HCT 48.1 (*)    All other components within normal limits  TROPONIN I    EKG EKG Interpretation  Date/Time:  Monday August 03 2018 15:40:44 EST Ventricular Rate:  103 PR Interval:  140 QRS Duration: 68 QT Interval:  330 QTC  Calculation: 432 R Axis:   -28 Text Interpretation:  Sinus tachycardia Minimal voltage criteria for LVH, may be normal variant Borderline ECG Confirmed by Virgina NorfolkAdam, Raisha Brabender [phone]([id number]) on 08/03/2018 4:07:59 PM   Radiology Dg Chest 2 View  Result Date: 08/03/2018 CLINICAL DATA:  Pt c/o cough since Thursday and intermittent central chest pain and left arm pain x 2 months. Hx of htn, dm, and is a smoker. EXAM: CHEST - 2 VIEW COMPARISON:  09/18/2016 FINDINGS: Lungs are clear. Heart size and mediastinal contours are within normal limits. No effusion.  Visualized bones unremarkable. IMPRESSION: No acute cardiopulmonary disease. Electronically Signed   By: Corlis Leak M.D.   On: 08/03/2018 16:15    Procedures Procedures (including critical care time)  Medications Ordered in ED Medications  sodium chloride flush (NS) 0.9 % injection 3 mL (3 mLs Intravenous Not Given 08/03/18 1730)     Initial Impression / Assessment and Plan / ED Course  I have reviewed the triage vital signs and the nursing notes.  Pertinent labs & imaging results that were available during my care of the patient were reviewed by me and considered in my medical decision making (see chart for details).     JANELLYS ZUCH is a 58 year old female with history of hypertension, high cholesterol who presents to the ED with intermittent chest pain, left upper shoulder pain.  Patient states that she has had symptoms on and off for the last several months.  Had improvement with Flexeril.  Pain started several months ago after incident while on vacation.  Had improvement on Flexeril at that time.  Continues to have some upper neck pain, left shoulder pain paresthesias on the left arm at times.  However no midline spinal tenderness on exam.  No range of motion limitations in the cervical spine.  Does have some paraspinal tenderness in the cervical spine.  Patient has some tenderness over the left side of her chest wall.  She has normal neurological  exam.  Normal strength, normal sensation throughout.  Overall symptoms appear to be very musculoskeletal.  She does have some cardiac risk factors.  EKG showed sinus rhythm.  No signs of ischemic changes.  Troponin normal.  No significant anemia, electrolyte abnormality, kidney injury.  Patient does not have any DVT or PE risk factors.  Wells criteria 0 and doubt PE.  Patient had no pneumonia, no pneumothorax or pleural effusion on chest x-ray.  Offered patient second troponin but she would like to follow-up outpatient.  I did read her primary care doctor's note today who states that they will schedule outpatient stress test.  I think this is reasonable at this time.  Patient is without any chest pain at this time.  Low concern for ACS at this time.  Only chest pain is reproducible.  Will prescribe Flexeril, lidocaine patch.  Given strict return precautions.  Discharged from ED in good condition.  This chart was dictated using voice recognition software.  Despite best efforts to proofread,  errors can occur which can change the documentation meaning.   Final Clinical Impressions(s) / ED Diagnoses   Final diagnoses:  Chest wall pain  Atypical chest pain  Left arm pain    ED Discharge Orders         Ordered    lidocaine (LIDODERM) 5 %  Every 24 hours     08/03/18 1727    cyclobenzaprine (FLEXERIL) 10 MG tablet  3 times daily PRN     08/03/18 1727           Virgina Norfolk, DO 08/03/18 1734

## 2018-08-03 NOTE — ED Notes (Signed)
Intermittent L sided CP x 2 months described as a "pop" with pain radiating down L arm. Pt reports associated ShOB, nausea, diaphoresis at times. Pt states nothing in particular that makes it better or worse. Pt in NAD during exam.

## 2018-08-12 ENCOUNTER — Ambulatory Visit (INDEPENDENT_AMBULATORY_CARE_PROVIDER_SITE_OTHER): Payer: 59

## 2018-08-12 DIAGNOSIS — R0789 Other chest pain: Secondary | ICD-10-CM | POA: Diagnosis not present

## 2018-08-13 LAB — EXERCISE TOLERANCE TEST
CHL CUP MPHR: 162 {beats}/min
CHL CUP RESTING HR STRESS: 87 {beats}/min
CSEPPHR: 166 {beats}/min
Estimated workload: 10.1 METS
Exercise duration (min): 7 min
Exercise duration (sec): 30 s
Percent HR: 102 %

## 2018-08-14 ENCOUNTER — Telehealth: Payer: Self-pay | Admitting: Family Medicine

## 2018-08-14 NOTE — Telephone Encounter (Signed)
Called her to go over over recent ETT results  Narrative & Impression      Blood pressure demonstrated a hypertensive response to exercise.  There was no ST segment deviation noted during stress.   ETT with fair exercise tolerance (7:30); no chest pain; hypertensive blood pressure response; no ST changes; negative adequate exercise tolerance test; Duke treadmill score 7.   She did fine, low risk Duke score  A Duke treatment score ?5 indicates low risk for cardiovascular events (predicted 4 year survival was 99%).  Called and LMOM as she did not answer.  Will mail her a copy and notes

## 2018-10-05 ENCOUNTER — Other Ambulatory Visit: Payer: Self-pay | Admitting: Family Medicine

## 2018-11-19 ENCOUNTER — Other Ambulatory Visit: Payer: Self-pay

## 2018-11-19 ENCOUNTER — Encounter: Payer: Self-pay | Admitting: Family Medicine

## 2018-11-19 ENCOUNTER — Ambulatory Visit (INDEPENDENT_AMBULATORY_CARE_PROVIDER_SITE_OTHER): Payer: 59 | Admitting: Family Medicine

## 2018-11-19 DIAGNOSIS — E7800 Pure hypercholesterolemia, unspecified: Secondary | ICD-10-CM | POA: Diagnosis not present

## 2018-11-19 DIAGNOSIS — M25512 Pain in left shoulder: Secondary | ICD-10-CM

## 2018-11-19 DIAGNOSIS — R200 Anesthesia of skin: Secondary | ICD-10-CM

## 2018-11-19 DIAGNOSIS — I10 Essential (primary) hypertension: Secondary | ICD-10-CM | POA: Diagnosis not present

## 2018-11-19 DIAGNOSIS — E119 Type 2 diabetes mellitus without complications: Secondary | ICD-10-CM | POA: Diagnosis not present

## 2018-11-19 DIAGNOSIS — G8929 Other chronic pain: Secondary | ICD-10-CM

## 2018-11-19 NOTE — Progress Notes (Signed)
[facility] [address], [address] [place], [place] [phone] [phone]  Date:  11/19/2018   Name:  Margaret CongressBrenda L Dejonge   DOB:  1961-04-08   MRN:  [id number]  PCP:  Pearline Cablesopland, Caydence Enck C, MD    Chief Complaint: No chief complaint on file.   History of Present Illness:  Margaret Sims is a 58 y.o. very pleasant female patient who presents with the following:  Virtual follow-up visit today for this patient with well controlled diabetes, dyslipidemia, hypertension Pt location is home Provider location is office Pt ID confirmed with her name and DOB-she gives consent for virtual visit today  I last saw her in the office in January, when she was having some chest discomfort and had to be referred to the emergency room.  She was evaluated and released We did an outpatient stress test for her in February which was reassuring  Since our last visit she has been overall doing ok She continues to have some issues with her LEFT shoulder She has pain with reaching behind herself, or reaching behind herself The right shoulder is ok, normal The left does ache when she lays in bed at night or if she rolls over onto the left arm  It has not gotten better since February She would like to see ortho about this at this time -it sounds like she may have rotator cuff tendinitis  Lab Results  Component Value Date   HGBA1C 6.7 (H) 05/20/2018   Lipitor 40 Lisinopril 20 Aspirin 81 Prilosec 40  Eye exam; this was done in December She is due for an A1c  She mentioned numbness in her fingers and toes to me a year ago, in March of 2019.I checked B12 and folate which were normal, I referred her to neurology but she declined the appt at that time She now mentions that she still has this problem, would like me to refer her again   Patient Active Problem List   Diagnosis Date Noted  . Diabetes type 2, controlled (HCC) 04/16/2016  . Essential  hypertension 02/19/2016  . Dyslipidemia 02/19/2016  . Tobacco use disorder 02/19/2016    Past Medical History:  Diagnosis Date  . Acid reflux   . Diabetes type 2, controlled (HCC) 04/16/2016  . High cholesterol   . Hypertension     Past Surgical History:  Procedure Laterality Date  . CESAREAN SECTION      Social History   Tobacco Use  . Smoking status: Current Every Day Smoker    Types: Cigarettes  . Smokeless tobacco: Never Used  Substance Use Topics  . Alcohol use: Yes  . Drug use: Never    No family history on file.  No Known Allergies  Medication list has been reviewed and updated.  Current Outpatient Medications on File Prior to Visit  Medication Sig Dispense Refill  . aspirin 81 MG tablet Take 81 mg by mouth daily.    Marland Kitchen. atorvastatin (LIPITOR) 40 MG tablet Take 1 tablet (40 mg total) by mouth daily. 30 tablet 11  . cyclobenzaprine (FLEXERIL) 10 MG tablet Take 1 tablet (10 mg total) by mouth 3 (three) times daily as needed for up to 15 doses for muscle spasms. 15 tablet 0  . lisinopril (PRINIVIL,ZESTRIL) 20 MG tablet Take 1 tablet (20 mg total) by mouth daily. 90 tablet 3  . omeprazole (PRILOSEC) 40 MG capsule TAKE ONE CAPSULE BY MOUTH DAILY 90 capsule 1  No current facility-administered medications on file prior to visit.     Review of Systems:  As per HPI- otherwise negative.   Physical Examination: There were no vitals filed for this visit. There were no vitals filed for this visit. There is no height or weight on file to calculate BMI. Ideal Body Weight:    Patient observed on video today.  She looks well, no cough, wheezing, tachypnea or distress She is not checking blood pressure at home Assessment and Plan: Controlled type 2 diabetes mellitus without complication, without long-term current use of insulin (HCC) - Plan: Hemoglobin A1c, Comprehensive metabolic panel  Essential hypertension  Pure hypercholesterolemia  Chronic left shoulder  pain - Plan: Ambulatory referral to Orthopedic Surgery  Numbness of toes - Plan: Ambulatory referral to Neurology  Numbness of fingers - Plan: Ambulatory referral to Neurology  Following up today She is due for routine labs as above, will have her come in next week Continue current blood pressure medication A1c pending Referral to orthopedics for shoulder pain, and to neurology for numbness of her fingers and toes for over a year Signed Abbe Amsterdam, MD

## 2018-12-07 DIAGNOSIS — Z01419 Encounter for gynecological examination (general) (routine) without abnormal findings: Secondary | ICD-10-CM

## 2018-12-07 DIAGNOSIS — M7502 Adhesive capsulitis of left shoulder: Secondary | ICD-10-CM

## 2018-12-07 DIAGNOSIS — E669 Obesity, unspecified: Secondary | ICD-10-CM

## 2018-12-07 HISTORY — DX: Obesity, unspecified: E66.9

## 2018-12-07 HISTORY — DX: Adhesive capsulitis of left shoulder: M75.02

## 2018-12-07 HISTORY — DX: Encounter for gynecological examination (general) (routine) without abnormal findings: Z01.419

## 2019-01-19 ENCOUNTER — Ambulatory Visit: Payer: Self-pay | Admitting: *Deleted

## 2019-01-19 ENCOUNTER — Ambulatory Visit (INDEPENDENT_AMBULATORY_CARE_PROVIDER_SITE_OTHER): Payer: 59 | Admitting: Medical

## 2019-01-19 DIAGNOSIS — K219 Gastro-esophageal reflux disease without esophagitis: Secondary | ICD-10-CM

## 2019-01-19 DIAGNOSIS — I10 Essential (primary) hypertension: Secondary | ICD-10-CM | POA: Diagnosis not present

## 2019-01-19 DIAGNOSIS — R5383 Other fatigue: Secondary | ICD-10-CM

## 2019-01-19 DIAGNOSIS — R51 Headache: Secondary | ICD-10-CM

## 2019-01-19 DIAGNOSIS — L749 Eccrine sweat disorder, unspecified: Secondary | ICD-10-CM

## 2019-01-19 DIAGNOSIS — R519 Headache, unspecified: Secondary | ICD-10-CM

## 2019-01-19 MED ORDER — FLUTICASONE PROPIONATE 50 MCG/ACT NA SUSP: 2.0000 | Freq: Every day | NASAL | 1 refills | Status: DC

## 2019-01-19 MED ORDER — FAMOTIDINE 20 MG PO TABS: 20.0000 mg | ORAL_TABLET | Freq: Every day | ORAL | 1 refills | Status: DC

## 2019-01-19 NOTE — Telephone Encounter (Signed)
Appt scheduled

## 2019-01-19 NOTE — Patient Instructions (Signed)
Patient had various concerns today.  1 of her first complaint is that she has had some headaches recently but no gross motor or sensory function deficits.  She has high blood pressure but I do not have a blood pressure reading today.  Also during the interview/discussion she mentioned that the day before her recent headache she was spray painting a piece of furniture and inhaled some of the pain.  She wonders if this could have contributed to headache.  Presently I counseled her the importance of getting a blood pressure cuff and checking blood pressure tonight.  If her blood pressure was over 140/90 then might want to make incremental adjustment in her blood pressure medication as it is possible BP playing a factor in headache.  If her blood pressure is above 140/90 then explained she could use Tylenol tonight and see if this helps the headache.  Also since she might have some inflammation of her sinuses after using spray can paint, I did prescribe her Flonase nasal spray.  If she has any worsening headache with any neurologic type symptoms and advised ED evaluation.  Patient does have some history of heartburn/GERD recently and has been belching with some mild pickups.  She describes slight pressure sensation at times in her upper esophagus/mid chest region.  When she belches she will get relief for 3 to 4 seconds and then symptoms will come back.  Patient has negative stress test earlier this year and she does not report any associated symptoms such as jaw pain or arm pain.  This is a virtual visit so doing EKG was impossible.  Therefore did counsel patient regarding if symptoms/signs of change then be seen in the emergency department.  Patient expressed understanding.  Currently advised to continue omeprazole and prescribed Pepcid as an add-on.  Advised to eat healthy.  Patient mentioned that she is in menopause and has had some transient brief sweating episodes.  She does not have a thermometer.  I  explained importance of her getting a thermometer today and checking her temperature.  If she has temperature above 99.6 then let us know.  Also discussion regarding her cough which she has had for years.  The cough is not productive.  She is not reporting any shortness of breath or wheezing.  She has a history of smoking and states that the cough that she has is at her baseline.  Patient also has some neuropathy for which she has appointment with neurologist tomorrow.  She at 1 point during discussion mentioned getting tested for COVID.  Before she mention this I was not under clinical suspicion to get her tested for COVID.  But since it was brought up I did explain to her that she would miss her appointment with neurologist if she has COVID test pending.  I did explain to her in detail very important that she get a thermometer and check to see if she has any fever.  Be honest on questionnaire with the neurologist as if she does have any fever then they would likely postpone her appointment.  I want patient to follow-up in 6 days or as needed.

## 2019-01-19 NOTE — Progress Notes (Signed)
Subjective:    Patient ID: Margaret Sims, female    DOB: 01-Jun-1961, [age] y.o.   MRN: [id number]  HPI  Virtual Visit via Video Note  I connected with Margaret Sims on 01/19/19 at  3:40 PM EDT by a video enabled telemedicine application and verified that I am speaking with the correct person using two identifiers.  Location: Patient: home Provider: home  Pt did not check her bp or pulse. She does not have machine at home. No temp check.   I discussed the limitations of evaluation and management by telemedicine and the availability of in person appointments. The patient expressed understanding and agreed to proceed.  History of Present Illness:  Pt reports hx of ha, fatigue, belching and hic ups recently.    Pt states yesterday morning had ha left side when woke up(day before she spray painted furniture) she could smell the pain.. Then all day yesterday had headache. Went away with ibuprofen. HA reoccured again today.   Pt feeling fatigue recently last couple of days. Some recent sweating yesterday and today. Pt speculates maybe menopause. She has not checked her temperature.  Pt has been taking omeprazole daily. Occasional belching and hic ups. Describes sensation of having food stuck in esophagus/upper chest. States when burps discomfort went away for 5 seconds then comes back.  Pt had stress test negative in feb 2020. Also no jaw pain, no arm pain or sob with above probable gerd/esophagus variant discomfort.   Pt does smoke and she get occasional cough. But recent cough not worse than baseline cough.    Pt was screened and asked questions about if she has chest pain. Triaged  Pt called with concerns of having the corona virus.Symptoms started on yesterday morning with a slight headache on one side and not feeling good. Headache this morning is all over. Feels like she gets hot has not checked temperature. Pt states she does have a cough but states that she usually does have  coughing due to being a smoker. Pt states she has experienced some shortness of breath with walking up steps.  Feels heaviness in the middle of chest. Does not get worse with activity. Pt states that she takes medications for acid reflux gets the same feeling of the heaviness in her chest if she has not taken it in a while. Pt states that she has been taking her acid reflux medication without missing a dose. Pt also state she feels like she has the sensation to have a bowel movement but it does not occur.Pt advised that if chest heaviness becomes worse or if other symptoms develop she will need to go to the ED. Pt verbalized understanding.   Call transferred to [facility] in the office to schedule appt.   Pt stays home most of time. Every now and then goes to grocery store. She wears mask and gloves. Pt has 7 family members in house. Mom, daugher and grandkids.     Observations/Objective: General-no acute distress, pleasant, oriented. Lungs- on inspection lungs appear unlabored. Neck- no tracheal deviation or jvd on inspection. Neuro- gross motor function appears intact.  Assessment and Plan: Patient had various concerns today.  Her  first complaint is that she has had some headaches recently but no gross motor or sensory function deficits.  She has high blood pressure but I do not have a blood pressure reading today.  Also during the interview/discussion she mentioned that the day before her recent headache she was spray painting a  piece of furniture and inhaled some of the pain.  She wonders if this could have contributed to headache.  Presently I counseled her the importance of getting a blood pressure cuff and checking blood pressure tonight.  If her blood pressure was over 140/90 then might want to make incremental adjustment in her blood pressure medication as it is possible BP playing a factor in headache.  If her blood pressure is above 140/90 then explained she could use Tylenol tonight and see  if this helps the headache.  Also since she might have some inflammation of her sinuses after using spray can paint, I did prescribe her Flonase nasal spray.  If she has any worsening headache with any neurologic type symptoms and advised ED evaluation.  Patient does have some history of heartburn/GERD recently and has been belching with some mild pickups.  She describes slight pressure sensation at times in her upper esophagus/mid chest region.  When she belches she will get relief for 3 to 4 seconds and then symptoms will come back.  Patient has negative stress test earlier this year and she does not report any associated symptoms such as jaw pain or arm pain.  This is a virtual visit so doing EKG was impossible.  Therefore did counsel patient regarding if symptoms/signs of change then be seen in the emergency department.  Patient expressed understanding.  Currently advised to continue omeprazole and prescribed Pepcid as an add-on.  Advised to eat healthy.   Patient mentioned that she is in menopause and has had some transient brief sweating episodes.  She does not have a thermometer.  I explained importance of her getting a thermometer today and checking her temperature.  If she has temperature above 99.6 then let us know.  Also discussion regarding her cough which she has had for years.  The cough is not productive.  She is not reporting any shortness of breath or wheezing.  She has a history of smoking and states that the cough that she has is at her baseline.   Patient also has some neuropathy for which she has appointment with neurologist tomorrow.  She at 1 point during discussion mentioned getting tested for COVID.  Before she mention this I was not under clinical suspicion to get her tested for COVID.  But since it was brought up I did explain to her that she would miss her appointment with neurologist if she has COVID test pending.  I did explain to her in detail very important that she get a  thermometer and check to see if she has any fever.  Be honest on questionnaire with the neurologist as if she does have any fever then they would likely postpone her appointment.  I want patient to follow-up in 6 days or as needed.   Follow Up Instructions:    I discussed the assessment and treatment plan with the patient. The patient was provided an opportunity to ask questions and all were answered. The patient agreed with the plan and demonstrated an understanding of the instructions.   The patient was advised to call back or seek an in-person evaluation if the symptoms worsen or if the condition fails to improve as anticipated.  I provided 40  minutes of non-face-to-face time during this encounter.  50% of time spent counseling patient on her various complaints today.   Esperanza RichtersEdward Johnnae Impastato, PA-C    Review of Systems  Constitutional: Positive for diaphoresis and fatigue. Negative for chills and fever.  HENT: Positive for  sinus pressure. Negative for congestion, ear pain, rhinorrhea, sinus pain, sneezing and sore throat.   Eyes: Negative for photophobia.  Respiratory: Positive for cough. Negative for chest tightness, shortness of breath and wheezing.        Normal variant cough for her baseline in light of her smoking history.  No sob reported to me but some mentioned to triage staff.  Cardiovascular: Negative for chest pain and palpitations.  Gastrointestinal: Positive for abdominal pain. Negative for blood in stool, diarrhea, nausea and vomiting.  Genitourinary: Negative for difficulty urinating.  Musculoskeletal: Negative for myalgias.  Skin: Negative for rash.  Neurological: Positive for headaches. Negative for dizziness, seizures, weakness and light-headedness.       See HPI.  Hematological: Negative for adenopathy. Does not bruise/bleed easily.  Psychiatric/Behavioral: Negative for behavioral problems, dysphoric mood and suicidal ideas. The patient is not nervous/anxious.         Objective:   Physical Exam        Assessment & Plan:    .

## 2019-01-19 NOTE — Telephone Encounter (Signed)
Pt called with concerns of having the corona virus.Symptoms started on yesterday morning with a slight headache on one side and not feeling good. Headache this morning is all over. Feels like she gets hot has not checked temperature. Pt states she does have a cough but states that she usually does have coughing due to being a smoker. Pt states she has experienced some shortness of breath with walking up steps.  Feels heaviness in the middle of chest. Does not get worse with activity. Pt states that she takes medications for acid reflux gets the same feeling of the heaviness in her chest if she has not taken it in a while. Pt states that she has been taking her acid reflux medication without missing a dose. Pt also state she feels like she has the sensation to have a bowel movement but it does not occur.Pt advised that if chest heaviness becomes worse or if other symptoms develop she will need to go to the ED. Pt verbalized understanding.   [facility].   Reason for Disposition . MILD difficulty breathing (e.g., minimal/no SOB at rest, SOB with walking, pulse <100)  Answer Assessment - Initial Assessment Questions 1. LOCATION: "Where does it hurt?"       Middle of the chest 2. RADIATION: "Does the pain go anywhere else?" (e.g., into neck, jaw, arms, back)     No 3. ONSET: "When did the chest pain begin?" (Minutes, hours or days)      yesterday 4. PATTERN "Does the pain come and go, or has it been constant since it started?"  "Does it get worse with exertion?"      Comes and goes, does not get worse with activity 5. DURATION: "How long does it last" (e.g., seconds, minutes, hours)     Not stated at the time of call, started on yesterday 6. SEVERITY: "How bad is the pain?"  (e.g., Scale 1-10; mild, moderate, or severe)    - MILD (1-3): doesn't interfere with normal activities     - MODERATE (4-7): interferes with normal activities or awakens from sleep   - SEVERE (8-10): excruciating pain, unable to do any normal activities       Doesn't describe as pain states that it feels heavy 7. CARDIAC RISK FACTORS: "Do you have any history of heart problems or risk factors for heart disease?" (e.g., angina, prior heart attack; diabetes, high blood pressure, high cholesterol, smoker, or strong family history of heart disease)     No 8. PULMONARY RISK FACTORS: "Do you have any history of lung disease?"  (e.g., blood clots in lung, asthma, emphysema, birth control pills)     No but is a current smoker 9. CAUSE: "What do you think is causing the chest pain?"     unknown 10. OTHER SYMPTOMS: "Do you have any other symptoms?" (e.g., dizziness, nausea, vomiting, sweating, fever, difficulty breathing, cough)       Nausea, SOB at times, feels hot but unsure if she has fever 11. PREGNANCY: "Is there any chance you are pregnant?" "When was your last menstrual period?"       n/a  Answer Assessment - Initial Assessment Questions 1. COVID-19 DIAGNOSIS: "Who made your Coronavirus (COVID-19) diagnosis?" "Was it confirmed by a positive lab test?" If not diagnosed by a HCP, ask "Are there lots of cases (community spread) where you live?" (See public health department website, if unsure)     no 2. ONSET: "When did  the COVID-19 symptoms start?"      yesterday 3. WORST SYMPTOM: "What is your worst symptom?" (e.g., cough, fever, shortness of breath, muscle aches)     Headache 4. COUGH: "Do you have a cough?" If so, ask: "How bad is the cough?"       Yes but states she has a cough at baseline due to history of being a smoker 5. FEVER: "Do you have a fever?" If so, ask: "What is your temperature, how was it measured, and when did it start?"     Has not checked temperature but has felt hot 6. RESPIRATORY STATUS: "Describe your breathing?" (e.g., shortness of breath, wheezing, unable to speak)      SOB with walking up steps 7. BETTER-SAME-WORSE: "Are you getting better,  staying the same or getting worse compared to yesterday?"  If getting worse, ask, "In what way?"     n/a 8. HIGH RISK DISEASE: "Do you have any chronic medical problems?" (e.g., asthma, heart or lung disease, weak immune system, etc.)     no 9. PREGNANCY: "Is there any chance you are pregnant?" "When was your last menstrual period?"     n/a 10. OTHER SYMPTOMS: "Do you have any other symptoms?"  (e.g., chills, fatigue, headache, loss of smell or taste, muscle pain, sore throat)       Headache, feels hot, nausea  Protocols used: CORONAVIRUS (COVID-19) DIAGNOSED OR SUSPECTED-A-AH, CHEST PAIN-A-AH

## 2019-01-20 ENCOUNTER — Telehealth: Payer: Self-pay | Admitting: Medical

## 2019-01-20 NOTE — Telephone Encounter (Signed)
How is pt doing? Did she attend neurologist appointment. Did she check her blood pressure as tempetrature? We had conversation about potentially getting covid testing. Ask her for update on how she is?

## 2019-01-21 NOTE — Telephone Encounter (Signed)
Pt is okay. pt seen neurologist yesterday states everything is fine. Pt states she checked her bp yesterday first time 150/144/ second tin 141/96.

## 2019-01-21 NOTE — Telephone Encounter (Signed)
bp was high at MD office but at home may be less. Still recommend get the bp cuff over the counter. Check bp daily and follow up virtual visit with pcp or me in 10 days. If bp not less than 140/90 then recommend add on bp med for better control.

## 2019-01-22 NOTE — Telephone Encounter (Signed)
Pt notified. Pt checked bp today 115/96.

## 2019-01-22 NOTE — Telephone Encounter (Signed)
Ok- looks like plan to follow-up in 10 days if elevated BP continues.  Thanks Percell Miller!

## 2019-01-28 ENCOUNTER — Ambulatory Visit: Payer: Self-pay | Admitting: *Deleted

## 2019-01-28 ENCOUNTER — Encounter (HOSPITAL_BASED_OUTPATIENT_CLINIC_OR_DEPARTMENT_OTHER): Payer: Self-pay | Admitting: Emergency Medicine

## 2019-01-28 ENCOUNTER — Emergency Department (HOSPITAL_BASED_OUTPATIENT_CLINIC_OR_DEPARTMENT_OTHER)
Admission: EM | Admit: 2019-01-28 | Discharge: 2019-01-28 | Disposition: A | Discharge status: Home / Self Care | Payer: 59 | Attending: Emergency Medicine | Admitting: Emergency Medicine

## 2019-01-28 ENCOUNTER — Other Ambulatory Visit: Payer: Self-pay

## 2019-01-28 DIAGNOSIS — E119 Type 2 diabetes mellitus without complications: Secondary | ICD-10-CM | POA: Diagnosis not present

## 2019-01-28 DIAGNOSIS — I10 Essential (primary) hypertension: Secondary | ICD-10-CM | POA: Diagnosis present

## 2019-01-28 DIAGNOSIS — Z79899 Other long term (current) drug therapy: Secondary | ICD-10-CM | POA: Diagnosis not present

## 2019-01-28 DIAGNOSIS — F17210 Nicotine dependence, cigarettes, uncomplicated: Secondary | ICD-10-CM | POA: Insufficient documentation

## 2019-01-28 DIAGNOSIS — R51 Headache: Secondary | ICD-10-CM | POA: Diagnosis not present

## 2019-01-28 LAB — CBC WITH DIFFERENTIAL/PLATELET
Abs Immature Granulocytes: 0.01 10*3/uL (ref 0.00–0.07)
Basophils Absolute: 0 10*3/uL (ref 0.0–0.1)
Basophils Relative: 1 %
Eosinophils Absolute: 0.1 10*3/uL (ref 0.0–0.5)
Eosinophils Relative: 1 %
HCT: 45.6 % (ref 36.0–46.0)
Hemoglobin: 14.5 g/dL (ref 12.0–15.0)
Immature Granulocytes: 0 %
Lymphocytes Relative: 42 %
Lymphs Abs: 2.7 10*3/uL (ref 0.7–4.0)
MCH: 31.5 pg (ref 26.0–34.0)
MCHC: 31.8 g/dL (ref 30.0–36.0)
MCV: 99.1 fL (ref 80.0–100.0)
Monocytes Absolute: 0.5 10*3/uL (ref 0.1–1.0)
Monocytes Relative: 7 %
Neutro Abs: 3.1 10*3/uL (ref 1.7–7.7)
Neutrophils Relative %: 49 %
Platelets: 223 10*3/uL (ref 150–400)
RBC: 4.6 MIL/uL (ref 3.87–5.11)
RDW: 12.5 % (ref 11.5–15.5)
WBC: 6.5 10*3/uL (ref 4.0–10.5)
nRBC: 0 % (ref 0.0–0.2)

## 2019-01-28 LAB — BASIC METABOLIC PANEL
Anion gap: 10 (ref 5–15)
BUN: 11 mg/dL (ref 6–20)
CO2: 25 mmol/L (ref 22–32)
Calcium: 9.5 mg/dL (ref 8.9–10.3)
Chloride: 107 mmol/L (ref 98–111)
Creatinine, Ser: 0.94 mg/dL (ref 0.44–1.00)
GFR calc Af Amer: >60 mL/min (ref >60)
GFR calc non Af Amer: >60 mL/min (ref >60)
Glucose, Bld: 128 mg/dL — ABNORMAL HIGH (ref 70–99)
Potassium: 3.6 mmol/L (ref 3.5–5.1)
Sodium: 142 mmol/L (ref 135–145)

## 2019-01-28 LAB — URINALYSIS, ROUTINE W REFLEX MICROSCOPIC
Bilirubin Urine: NEGATIVE
Glucose, UA: NEGATIVE mg/dL
Ketones, ur: 15 mg/dL — AB
Leukocytes,Ua: NEGATIVE
Nitrite: NEGATIVE
Protein, ur: 30 mg/dL — AB
Specific Gravity, Urine: >1.03 — ABNORMAL HIGH (ref 1.005–1.030)
pH: 6 (ref 5.0–8.0)

## 2019-01-28 LAB — URINALYSIS, MICROSCOPIC (REFLEX)

## 2019-01-28 NOTE — Telephone Encounter (Signed)
Patient in ED for evaluation. Did not pick up telephone.

## 2019-01-28 NOTE — ED Provider Notes (Signed)
[facility] EMERGENCY DEPARTMENT Provider Note   CSN: [id number] Arrival date & time: 01/28/19  1456    History   Chief Complaint Chief Complaint  Patient presents with  . Hypertension    HPI Graceann CongressBrenda L Devins is a 58 y.o. female past medical history of type 2 diabetes, hyperlipidemia, hypertension, presenting to the emergency department with complaint of 1 week of high blood pressure.  She states her blood pressure needs to be higher than usual, around 160 systolic.  She states she has been unable to get an appointment with your primary care provider within the week, and therefore she presents to the ED for further evaluation.  She is compliant with her lisinopril.  She reports a very mild intermittent headache.  Denies vision changes, chest pain, shortness of breath, leg swelling.     The history is provided by the patient.    Past Medical History:  Diagnosis Date  . Acid reflux   . Diabetes type 2, controlled (HCC) 04/16/2016  . High cholesterol   . Hypertension     Patient Active Problem List   Diagnosis Date Noted  . Diabetes type 2, controlled (HCC) 04/16/2016  . Essential hypertension 02/19/2016  . Dyslipidemia 02/19/2016  . Tobacco use disorder 02/19/2016    Past Surgical History:  Procedure Laterality Date  . CESAREAN SECTION       OB History   No obstetric history on file.      Home Medications    Prior to Admission medications   Medication Sig Start Date End Date Taking? Authorizing Provider  aspirin 81 MG tablet Take 81 mg by mouth daily.    [provider]  atorvastatin (LIPITOR) 40 MG tablet Take 1 tablet (40 mg total) by mouth daily. 02/25/18   Copland, Gwenlyn FoundJessica C, MD  fluticasone (FLONASE) 50 MCG/ACT nasal spray Place 2 sprays into both nostrils daily. 01/19/19   Saguier, Ramon DredgeEdward, PA-C  lisinopril (PRINIVIL,ZESTRIL) 20 MG tablet Take 1 tablet (20 mg total) by mouth daily. 05/20/18   Copland, Gwenlyn FoundJessica C, MD  omeprazole (PRILOSEC) 40  MG capsule TAKE ONE CAPSULE BY MOUTH DAILY 10/06/18   Copland, Gwenlyn FoundJessica C, MD    Family History No family history on file.  Social History Social History   Tobacco Use  . Smoking status: Current Every Day Smoker    Packs/day: 0.50    Types: Cigarettes  . Smokeless tobacco: Never Used  Substance Use Topics  . Alcohol use: Yes    Comment: 1-2 times per week  . Drug use: Never     Allergies   Patient has no known allergies.   Review of Systems Review of Systems  All other systems reviewed and are negative.    Physical Exam Updated Vital Signs BP (!) 181/106 (BP Location: Left Arm)   Pulse 80   Temp 98.1 F (36.7 C) (Oral)   Resp 16   Ht 5\' 7"  (1.702 m)   Wt 72.8 kg   SpO2 100%   BMI 25.12 kg/m   Physical Exam Vitals signs and nursing note reviewed.  Constitutional:      General: She is not in acute distress.    Appearance: She is well-developed. She is not ill-appearing.  HENT:     Head: Normocephalic and atraumatic.  Eyes:     Extraocular Movements: Extraocular movements intact.     Conjunctiva/sclera: Conjunctivae normal.     Pupils: Pupils are equal, round, and reactive to light.  Cardiovascular:     Rate  and Rhythm: Normal rate and regular rhythm.  Pulmonary:     Effort: Pulmonary effort is normal. No respiratory distress.     Breath sounds: Normal breath sounds.  Abdominal:     General: Bowel sounds are normal.     Palpations: Abdomen is soft.     Tenderness: There is no abdominal tenderness.  Musculoskeletal:     Right lower leg: No edema.     Left lower leg: No edema.  Skin:    General: Skin is warm.  Neurological:     Mental Status: She is alert.     Comments: Mental Status:  Alert, oriented, thought content appropriate, able to give a coherent history. Speech fluent without evidence of aphasia. Able to follow 2 step commands without difficulty.  Cranial Nerves:  II:  Peripheral visual fields grossly normal, pupils equal, round, reactive to  light III,IV, VI: ptosis not present, extra-ocular motions intact bilaterally  V,VII: smile symmetric, facial light touch sensation equal VIII: hearing grossly normal to voice  X: uvula elevates symmetrically  XI: bilateral shoulder shrug symmetric and strong XII: midline tongue extension without fassiculations Motor:  Normal tone. 5/5 in upper and lower extremities bilaterally including strong and equal grip strength and dorsiflexion/plantar flexion Sensory: grossly normal in all extremities.  Cerebellar: normal finger-to-nose with bilateral upper extremities Gait: normal gait and balance CV: distal pulses palpable throughout    Psychiatric:        Behavior: Behavior normal.      ED Treatments / Results  Labs (all labs ordered are listed, but only abnormal results are displayed) Labs Reviewed  BASIC METABOLIC PANEL - Abnormal; Notable for the following components:      Result Value   Glucose, Bld 128 (*)    All other components within normal limits  URINALYSIS, ROUTINE W REFLEX MICROSCOPIC - Abnormal; Notable for the following components:   Specific Gravity, Urine >1.030 (*)    Hgb urine dipstick SMALL (*)    Ketones, ur 15 (*)    Protein, ur 30 (*)    All other components within normal limits  URINALYSIS, MICROSCOPIC (REFLEX) - Abnormal; Notable for the following components:   Bacteria, UA FEW (*)    All other components within normal limits  CBC WITH DIFFERENTIAL/PLATELET    EKG None  Radiology No results found.  Procedures Procedures (including critical care time)  Medications Ordered in ED Medications - No data to display   Initial Impression / Assessment and Plan / ED Course  I have reviewed the triage vital signs and the nursing notes.  Pertinent labs & imaging results that were available during my care of the patient were reviewed by me and considered in my medical decision making (see chart for details).        Patient noted to be hypertensive in  the emergency department.  No signs of hypertensive urgency.  Labs are reassuring. Normal neuro exam. Discussed with patient the need for close follow-up and management by their primary care physician.   Discussed results, findings, treatment and follow up. Patient advised of return precautions. Patient verbalized understanding and agreed with plan.  Final Clinical Impressions(s) / ED Diagnoses   Final diagnoses:  Hypertension, unspecified type    ED Discharge Orders    None       Cassell Voorhies, [place] N, PA-C 01/28/19 1728    Margette Fast, MD 01/28/19 1958

## 2019-01-28 NOTE — Telephone Encounter (Signed)
Patient calls with high b/p readings over the last week. Reports no higher than 157/101 in the last 7 days. Today, b/p 188/116. She experienced chest heaviness once last week but not currently. Has a mild headache today along with fatigue and shortness of breath with activity. Feels lightheaded at times over the last week.  Denies one-sided weakness/vision changes. Attempted to transfer call for possible afternoon appointment. No answer x3. Reviewed all urgent sxs that require immediate evaluation. Stated she understood. She will wait 30 minutes a a return call, recheck pressure and go to ED if necessary.  Reason for Disposition . Systolic BP  >= 364 OR Diastolic >= 680  Answer Assessment - Initial Assessment Questions 1. BLOOD PRESSURE: "What is the blood pressure?" "Did you take at least two measurements 5 minutes apart?"    188/116 today 2. ONSET: "When did you take your blood pressure?"     10 minutes 3. HOW: "How did you obtain the blood pressure?" (e.g., visiting nurse, automatic home BP monitor)     Home automatic arm cuff 4. HISTORY: "Do you have a history of high blood pressure?"     yes 5. MEDICATIONS: "Are you taking any medications for blood pressure?" "Have you missed any doses recently?"     Yes, daily and have not missed any doses 6. OTHER SYMPTOMS: "Do you have any symptoms?" (e.g., headache, chest pain, blurred vision, difficulty breathing, weakness)     Chest heaviness last week not now. Lightheaded sometimes. 7. PREGNANCY: "Is there any chance you are pregnant?" "When was your last menstrual period?"     no  Protocols used: HIGH BLOOD PRESSURE-A-AH

## 2019-01-28 NOTE — ED Triage Notes (Signed)
BP at home higher than normal for a week.  Cannot get in to see pmd at this time so they sent her here. Last bp at home was 165/123

## 2019-01-28 NOTE — Discharge Instructions (Addendum)
Keep a log of your blood pressures over the next week take and follow-up with your primary care doctor.  Bring this log with you to your primary care visit. Continue taking your lisinopril as prescribed. Limit your salt intake, this will help your blood pressures. Return to the emergency department if you develop severe headache, vision changes, chest pain, or new concerning symptoms.

## 2019-01-30 NOTE — Progress Notes (Addendum)
[place] at [facility] [place], [address], [place] [address] [phone]  Date:  02/01/2019   Name:  Margaret Sims   DOB:  1960-10-21   MRN:  [id number]  PCP:  Margaret Mclean, MD    Chief Complaint: Hypertension (blood pressure check, has been running high at home)   History of Present Illness:  Margaret Sims is a 58 y.o. very pleasant female patient who presents with the following:  History of diet controlled DM and HTN, hyperlipidemia I last saw her in May of this year for a virtual visit  Here today for a BP check  She went to the ER on 7/23 due to elevated BP - systolic approx 009, she was instructed to go to the ER by phone service She had first noted some sx on 7/13- felt sluggish and not quite 100%.   She got worried as her BP seemed to go "up and up"  She is taking lisinopril 40- we increased her from 10 to 20 mg last year   BP Readings from Last 3 Encounters:  02/01/19 (!) 138/98  01/28/19 (!) 169/105  08/03/18 (!) 134/99   Lab Results  Component Value Date   HGBA1C 6.7 (H) 05/20/2018   Foot exam is due Labs are due - A1c and lipids Suggest shingrix- discussed with her today she would like to defer   Patient Active Problem List   Diagnosis Date Noted  . Diabetes type 2, controlled ([place]) 04/16/2016  . Essential hypertension 02/19/2016  . Dyslipidemia 02/19/2016  . Tobacco use disorder 02/19/2016    Past Medical History:  Diagnosis Date  . Acid reflux   . Diabetes type 2, controlled ([place]) 04/16/2016  . High cholesterol   . Hypertension     Past Surgical History:  Procedure Laterality Date  . CESAREAN SECTION      Social History   Tobacco Use  . Smoking status: Current Every Day Smoker    Packs/day: 0.50    Types: Cigarettes  . Smokeless tobacco: Never Used  Substance Use Topics  . Alcohol use: Yes    Comment: 1-2 times per week  . Drug use: Never    History reviewed. No  pertinent family history.  No Known Allergies  Medication list has been reviewed and updated.  Current Outpatient Medications on File Prior to Visit  Medication Sig Dispense Refill  . aspirin 81 MG tablet Take 81 mg by mouth daily.    Marland Kitchen atorvastatin (LIPITOR) 40 MG tablet Take 1 tablet (40 mg total) by mouth daily. 30 tablet 11  . lisinopril (PRINIVIL,ZESTRIL) 20 MG tablet Take 1 tablet (20 mg total) by mouth daily. 90 tablet 3  . omeprazole (PRILOSEC) 40 MG capsule TAKE ONE CAPSULE BY MOUTH DAILY 90 capsule 1   No current facility-administered medications on file prior to visit.     Review of Systems:  As per HPI- otherwise negative. No CP or SOB    Physical Examination: Vitals:   02/01/19 1330  BP: (!) 138/98  Pulse: 97  Resp: 16  Temp: 97.9 F (36.6 C)  SpO2: 100%   Vitals:   02/01/19 1330  Weight: 160 lb (72.6 kg)  Height: 5\' 7"  (1.702 m)   Body mass index is 25.06 kg/m. Ideal Body Weight: Weight in (lb) to have BMI = 25: 159.3  GEN: WDWN, NAD, Non-toxic, A & O x 3, normal weight, looks well  HEENT: Atraumatic, Normocephalic.  Neck supple. No masses, No LAD. Ears and Nose: No external deformity. CV: RRR, No M/G/R. No JVD. No thrill. No extra heart sounds. PULM: CTA B, no wheezes, crackles, rhonchi. No retractions. No resp. distress. No accessory muscle use. ABD: S, NT, ND, +BS. No rebound. No HSM. EXTR: No c/c/e NEURO Normal gait.  PSYCH: Normally interactive. Conversant. Not depressed or anxious appearing.  Calm demeanor.    Assessment and Plan:   ICD-10-CM   1. Screening for HIV (human immunodeficiency virus)  Z11.4 HIV Antibody (routine testing w rflx)  2. Essential hypertension  I10 TSH    lisinopril (ZESTRIL) 40 MG tablet  3. Controlled type 2 diabetes mellitus without complication, without long-term current use of insulin (HCC)  E11.9 Hemoglobin A1c  4. Pure hypercholesterolemia  E78.00 Lipid panel    atorvastatin (LIPITOR) 40 MG tablet    Following up on elevated BP Increase lisinopril to 40 mg- she will let me know how she responds, given goal BP parameters Other labs pending as above   Follow-up: No follow-ups on file.  Meds ordered this encounter  Medications  . atorvastatin (LIPITOR) 40 MG tablet    Sig: Take 1 tablet (40 mg total) by mouth daily.    Dispense:  90 tablet    Refill:  3  . lisinopril (ZESTRIL) 40 MG tablet    Sig: Take 1 tablet (40 mg total) by mouth daily.    Dispense:  90 tablet    Refill:  3   Orders Placed This Encounter  Procedures  . Hemoglobin A1c  . Lipid panel  . TSH  . HIV Antibody (routine testing w rflx)    @SIGN @    Signed Abbe AmsterdamJessica Delila Kuklinski, MD   Received her labs, letter to pt  Results for orders placed or performed in visit on 02/01/19  Hemoglobin A1c  Result Value Ref Range   Hgb A1c MFr Bld 6.5 4.6 - 6.5 %  Lipid panel  Result Value Ref Range   Cholesterol 167 0 - 200 mg/dL   Triglycerides 161.0115.0 0.0 - 149.0 mg/dL   HDL 96.0442.50 >54.09>39.00 mg/dL   VLDL 81.123.0 0.0 - 91.440.0 mg/dL   LDL Cholesterol 782101 (H) 0 - 99 mg/dL   Total CHOL/HDL Ratio 4    NonHDL 124.12   TSH  Result Value Ref Range   TSH 0.86 0.35 - 4.50 uIU/mL   A1c stable/ improved lipitor   The 10-year ASCVD risk score Denman George(Goff DC Jr., et al., 2013) is: 33%   Values used to calculate the score:     Age: [age] years     Sex: Female     Is Non-Hispanic African American: Yes     Diabetic: Yes     Tobacco smoker: Yes     Systolic Blood Pressure: 138 mmHg     Is BP treated: Yes     HDL Cholesterol: 42.5 mg/dL     Total Cholesterol: 167 mg/dL

## 2019-02-01 ENCOUNTER — Other Ambulatory Visit: Payer: Self-pay

## 2019-02-01 ENCOUNTER — Ambulatory Visit (INDEPENDENT_AMBULATORY_CARE_PROVIDER_SITE_OTHER): Payer: 59 | Admitting: Family Medicine

## 2019-02-01 ENCOUNTER — Encounter: Payer: Self-pay | Admitting: Family Medicine

## 2019-02-01 VITALS — BP 138/98 | HR 97 | Temp 97.9°F | Resp 16 | Ht 67.0 in | Wt 160.0 lb

## 2019-02-01 DIAGNOSIS — Z114 Encounter for screening for human immunodeficiency virus [HIV]: Secondary | ICD-10-CM | POA: Diagnosis not present

## 2019-02-01 DIAGNOSIS — E7800 Pure hypercholesterolemia, unspecified: Secondary | ICD-10-CM

## 2019-02-01 DIAGNOSIS — E119 Type 2 diabetes mellitus without complications: Secondary | ICD-10-CM | POA: Diagnosis not present

## 2019-02-01 DIAGNOSIS — I10 Essential (primary) hypertension: Secondary | ICD-10-CM | POA: Diagnosis not present

## 2019-02-01 LAB — LIPID PANEL
Cholesterol: 167 mg/dL (ref 0–200)
HDL: 42.5 mg/dL (ref >39.00)
LDL Cholesterol: 101 mg/dL — ABNORMAL HIGH (ref 0–99)
NonHDL: 124.12
Total CHOL/HDL Ratio: 4
Triglycerides: 115 mg/dL (ref 0.0–149.0)
VLDL: 23 mg/dL (ref 0.0–40.0)

## 2019-02-01 LAB — HEMOGLOBIN A1C: Hgb A1c MFr Bld: 6.5 % (ref 4.6–6.5)

## 2019-02-01 LAB — TSH: TSH: 0.86 u[IU]/mL (ref 0.35–4.50)

## 2019-02-01 MED ORDER — LISINOPRIL 40 MG PO TABS: 40.0000 mg | ORAL_TABLET | Freq: Every day | ORAL | 3 refills | Status: DC

## 2019-02-01 MED ORDER — ATORVASTATIN CALCIUM 40 MG PO TABS: 40.0000 mg | ORAL_TABLET | Freq: Every day | ORAL | 3 refills | Status: DC

## 2019-02-01 NOTE — Patient Instructions (Signed)
Great to see you today- let's try increasing your lisinopril to 40 mg.  If you want to take 2 of the 20mg  that you have on hand for a few days prior to filling the 40 mg rx that is fine  Goal BP 115-135/75-85; please let me know how you do on 40 mg of lisinopril   I will be in touch with your labs asap

## 2019-02-02 LAB — HIV ANTIBODY (ROUTINE TESTING W REFLEX): HIV 1&2 Ab, 4th Generation: NONREACTIVE

## 2019-04-15 ENCOUNTER — Other Ambulatory Visit: Payer: Self-pay | Admitting: Family Medicine

## 2019-04-16 ENCOUNTER — Ambulatory Visit (INDEPENDENT_AMBULATORY_CARE_PROVIDER_SITE_OTHER): Payer: 59 | Admitting: *Deleted

## 2019-04-16 ENCOUNTER — Other Ambulatory Visit: Payer: Self-pay

## 2019-04-16 DIAGNOSIS — Z23 Encounter for immunization: Secondary | ICD-10-CM | POA: Diagnosis not present

## 2019-04-16 NOTE — Progress Notes (Signed)
Patient in today for flu vaccine.  Vaccine given in left deltoid and patient tolerated well. 

## 2019-05-24 ENCOUNTER — Other Ambulatory Visit: Payer: Self-pay | Admitting: *Deleted

## 2019-05-24 DIAGNOSIS — Z20822 Contact with and (suspected) exposure to covid-19: Secondary | ICD-10-CM

## 2019-05-26 LAB — NOVEL CORONAVIRUS, NAA: SARS-CoV-2, NAA: NOT DETECTED

## 2019-09-17 ENCOUNTER — Ambulatory Visit: Payer: Self-pay

## 2019-10-18 ENCOUNTER — Other Ambulatory Visit: Payer: Self-pay | Admitting: Family Medicine

## 2019-10-24 NOTE — Patient Instructions (Signed)
It was great to see you again today, I will be in touch with your labs as soon as possible Assuming all looks okay, we can plan to visit in 6 months We froze the skin lesion on your left ankle today- let me know if it does not peel off, in that case I would like to have you see dermatology for removal  We ordered your mammogram and lung cancer screening CT today    Health Maintenance, Female Adopting a healthy lifestyle and getting preventive care are important in promoting health and wellness. Ask your health care provider about:  The right schedule for you to have regular tests and exams.  Things you can do on your own to prevent diseases and keep yourself healthy. What should I know about diet, weight, and exercise? Eat a healthy diet   Eat a diet that includes plenty of vegetables, fruits, low-fat dairy products, and lean protein.  Do not eat a lot of foods that are high in solid fats, added sugars, or sodium. Maintain a healthy weight Body mass index (BMI) is used to identify weight problems. It estimates body fat based on height and weight. Your health care provider can help determine your BMI and help you achieve or maintain a healthy weight. Get regular exercise Get regular exercise. This is one of the most important things you can do for your health. Most adults should:  Exercise for at least 150 minutes each week. The exercise should increase your heart rate and make you sweat (moderate-intensity exercise).  Do strengthening exercises at least twice a week. This is in addition to the moderate-intensity exercise.  Spend less time sitting. Even light physical activity can be beneficial. Watch cholesterol and blood lipids Have your blood tested for lipids and cholesterol at 60 years of age, then have this test every 5 years. Have your cholesterol levels checked more often if:  Your lipid or cholesterol levels are high.  You are older than 60 years of age.  You are at high  risk for heart disease. What should I know about cancer screening? Depending on your health history and family history, you may need to have cancer screening at various ages. This may include screening for:  Breast cancer.  Cervical cancer.  Colorectal cancer.  Skin cancer.  Lung cancer. What should I know about heart disease, diabetes, and high blood pressure? Blood pressure and heart disease  High blood pressure causes heart disease and increases the risk of stroke. This is more likely to develop in people who have high blood pressure readings, are of African descent, or are overweight.  Have your blood pressure checked: ? Every 3-5 years if you are 41-32 years of age. ? Every year if you are 46 years old or older. Diabetes Have regular diabetes screenings. This checks your fasting blood sugar level. Have the screening done:  Once every three years after age 10 if you are at a normal weight and have a low risk for diabetes.  More often and at a younger age if you are overweight or have a high risk for diabetes. What should I know about preventing infection? Hepatitis B If you have a higher risk for hepatitis B, you should be screened for this virus. Talk with your health care provider to find out if you are at risk for hepatitis B infection. Hepatitis C Testing is recommended for:  Everyone born from [birth year] through 1966.  Anyone with known risk factors for hepatitis C. Sexually transmitted  infections (STIs)  Get screened for STIs, including gonorrhea and chlamydia, if: ? You are sexually active and are younger than 60 years of age. ? You are older than 60 years of age and your health care provider tells you that you are at risk for this type of infection. ? Your sexual activity has changed since you were last screened, and you are at increased risk for chlamydia or gonorrhea. Ask your health care provider if you are at risk.  Ask your health care provider about whether you  are at high risk for HIV. Your health care provider may recommend a prescription medicine to help prevent HIV infection. If you choose to take medicine to prevent HIV, you should first get tested for HIV. You should then be tested every 3 months for as long as you are taking the medicine. Pregnancy  If you are about to stop having your period (premenopausal) and you may become pregnant, seek counseling before you get pregnant.  Take 400 to 800 micrograms (mcg) of folic acid every day if you become pregnant.  Ask for birth control (contraception) if you want to prevent pregnancy. Osteoporosis and menopause Osteoporosis is a disease in which the bones lose minerals and strength with aging. This can result in bone fractures. If you are 52 years old or older, or if you are at risk for osteoporosis and fractures, ask your health care provider if you should:  Be screened for bone loss.  Take a calcium or vitamin D supplement to lower your risk of fractures.  Be given hormone replacement therapy (HRT) to treat symptoms of menopause. Follow these instructions at home: Lifestyle  Do not use any products that contain nicotine or tobacco, such as cigarettes, e-cigarettes, and chewing tobacco. If you need help quitting, ask your health care provider.  Do not use street drugs.  Do not share needles.  Ask your health care provider for help if you need support or information about quitting drugs. Alcohol use  Do not drink alcohol if: ? Your health care provider tells you not to drink. ? You are pregnant, may be pregnant, or are planning to become pregnant.  If you drink alcohol: ? Limit how much you use to 0-1 drink a day. ? Limit intake if you are breastfeeding.  Be aware of how much alcohol is in your drink. In the [place] one drink equals one 12 oz bottle of beer (355 mL), one 5 oz glass of wine (148 mL), or one 1 oz glass of hard liquor (44 mL). General instructions  Schedule regular  health, dental, and eye exams.  Stay current with your vaccines.  Tell your health care provider if: ? You often feel depressed. ? You have ever been abused or do not feel safe at home. Summary  Adopting a healthy lifestyle and getting preventive care are important in promoting health and wellness.  Follow your health care provider's instructions about healthy diet, exercising, and getting tested or screened for diseases.  Follow your health care provider's instructions on monitoring your cholesterol and blood pressure. This information is not intended to replace advice given to you by your health care provider. Make sure you discuss any questions you have with your health care provider. Document Revised: 06/17/2018 Document Reviewed: 06/17/2018 Elsevier Patient Education  2020 [employer].

## 2019-10-24 NOTE — Progress Notes (Addendum)
[place] at [employer] [place], [place], [place] [address] [phone]  Date:  10/27/2019   Name:  Margaret Sims   DOB:  07/23/1960   MRN:  [id number]  PCP:  Darreld Mclean, MD    Chief Complaint: Annual Exam   History of Present Illness:  Margaret Sims is a 59 y.o. very pleasant female patient who presents with the following:  Patient with history of diet-controlled diabetes, hypertension, dyslipidemia, smoking Here today for a physical exam Last seen by myself in July 2020 At that time her blood pressure is elevated, will increase her dose of lisinopril to 40 mg and did lab work She is working from home right now due to the pandemic- she hopes that she will be able to continue this schedule long term   Lab Results  Component Value Date   HGBA1C 6.5 02/01/2019   Eye exam; this is due, she will set it up  Can do foot exam Mammogram; I will order for her  Labs due today; will do today, she is fasting  COVID-19 vaccine Pap 2 years ago - normal  Tobacco use/pack years.  She has smoked about 40 years, with some breaks.  She tends to smoke 1/2 PPD in general  She is interested in lung cancer screening Not getting as much exercise as she would like- she plans to work on this  Aspirin Lipitor Lisinopril prilosec  Patient Active Problem List   Diagnosis Date Noted  . Diabetes type 2, controlled ([place]) 04/16/2016  . Essential hypertension 02/19/2016  . Dyslipidemia 02/19/2016  . Tobacco use disorder 02/19/2016    Past Medical History:  Diagnosis Date  . Acid reflux   . Diabetes type 2, controlled ([place]) 04/16/2016  . High cholesterol   . Hypertension     Past Surgical History:  Procedure Laterality Date  . CESAREAN SECTION      Social History   Tobacco Use  . Smoking status: Current Every Day Smoker    Packs/day: 0.50    Types: Cigarettes  . Smokeless tobacco: Never Used  Substance Use Topics   . Alcohol use: Yes    Comment: 1-2 times per week  . Drug use: Never    History reviewed. No pertinent family history.  No Known Allergies  Medication list has been reviewed and updated.  Current Outpatient Medications on File Prior to Visit  Medication Sig Dispense Refill  . aspirin 81 MG tablet Take 81 mg by mouth daily.    Marland Kitchen omeprazole (PRILOSEC) 40 MG capsule TAKE 1 CAPSULE BY MOUTH DAILY 90 capsule 1   No current facility-administered medications on file prior to visit.    Review of Systems:  As per HPI- otherwise negative.   Physical Examination: Vitals:   10/27/19 0937 10/27/19 0956  BP: (!) 146/90 122/80  Pulse: 83   Resp: 16   Temp: (!) 96.9 F (36.1 C)   SpO2: 98%    Vitals:   10/27/19 0937  Weight: 160 lb (72.6 kg)  Height: 5\' 7"  (1.702 m)   Body mass index is 25.06 kg/m. Ideal Body Weight: Weight in (lb) to have BMI = 25: 159.3  GEN: no acute distress.  Normal weight, looks well  HEENT: Atraumatic, Normocephalic.   Bilateral TM wnl, oropharynx normal.  PEERL,EOMI.   Ears and Nose: No external deformity. CV: RRR, No M/G/R. No JVD. No thrill. No extra heart sounds. PULM: CTA B, no  wheezes, crackles, rhonchi. No retractions. No resp. distress. No accessory muscle use. ABD: S, NT, ND, +BS. No rebound. No HSM. EXTR: No c/c/e PSYCH: Normally interactive. Conversant.  Foot exam normal, she does have peripheral varicose and spider veins Skin lesion left ankle-  approx 6 mm in diameter, per pt present for years, can be scaly.  Appears to be SK  VC obtained-  LN x3 to skin lesion left ankle Pt tolerated well with no immediate complications   BP Readings from Last 3 Encounters:  10/27/19 122/80  02/01/19 (!) 138/98  01/28/19 (!) 169/105   She does check her home BP at times- but not recently  Assessment and Plan: Physical exam  Essential hypertension - Plan: CBC, Comprehensive metabolic panel, lisinopril (ZESTRIL) 40 MG tablet  Controlled type 2  diabetes mellitus without complication, without long-term current use of insulin (HCC) - Plan: Hemoglobin A1c, Microalbumin / creatinine urine ratio  Hyperlipidemia associated with type 2 diabetes mellitus (HCC) - Plan: Lipid panel  Screening for thyroid disorder - Plan: TSH  Screening for deficiency anemia - Plan: CBC  Encounter for screening for lung cancer - Plan: CT CHEST LUNG CA SCREEN LOW DOSE W/O CM  Encounter for screening mammogram for breast cancer - Plan: MM 3D SCREEN BREAST BILATERAL  Pure hypercholesterolemia - Plan: atorvastatin (LIPITOR) 40 MG tablet  CPE today Labs pending as above Ordered mammo and lung cancer screening BP under ok control Refilled meds Will plan further follow- up pending labs. Asked her to report if skin lesion on ankle does not resolve with cryotherpy This visit occurred during the SARS-CoV-2 public health emergency.  Safety protocols were in place, including screening questions prior to the visit, additional usage of staff PPE, and extensive cleaning of exam room while observing appropriate contact time as indicated for disinfecting solutions.   Discussed diet, exercise, tobacco cessation Signed Abbe Amsterdam, MD  Addendum 4/22, received her labs as below Message to patient Consider changing from Lipitor to Crestor  Results for orders placed or performed in visit on 10/27/19  CBC  Result Value Ref Range   WBC 6.0 4.0 - 10.5 K/uL   RBC 4.25 3.87 - 5.11 Mil/uL   Platelets 243.0 150.0 - 400.0 K/uL   Hemoglobin 13.5 12.0 - 15.0 g/dL   HCT 78.2 95.6 - 21.3 %   MCV 96.4 78.0 - 100.0 fl   MCHC 32.9 30.0 - 36.0 g/dL   RDW 08.6 57.8 - 46.9 %  Comprehensive metabolic panel  Result Value Ref Range   Sodium 142 135 - 145 mEq/L   Potassium 4.2 3.5 - 5.1 mEq/L   Chloride 106 96 - 112 mEq/L   CO2 30 19 - 32 mEq/L   Glucose, Bld 91 70 - 99 mg/dL   BUN 9 6 - 23 mg/dL   Creatinine, Ser 6.29 0.40 - 1.20 mg/dL   Total Bilirubin 0.4 0.2 - 1.2  mg/dL   Alkaline Phosphatase 90 39 - 117 U/L   AST 13 0 - 37 U/L   ALT 9 0 - 35 U/L   Total Protein 6.4 6.0 - 8.3 g/dL   Albumin 4.2 3.5 - 5.2 g/dL   GFR 52.84 >13.24 mL/min   Calcium 9.3 8.4 - 10.5 mg/dL  Hemoglobin M0N  Result Value Ref Range   Hgb A1c MFr Bld 6.5 4.6 - 6.5 %  Lipid panel  Result Value Ref Range   Cholesterol 171 0 - 200 mg/dL   Triglycerides 02.7 0.0 - 149.0 mg/dL  HDL 47.20 >39.00 mg/dL   VLDL 24.2 0.0 - 68.3 mg/dL   LDL Cholesterol 419 (H) 0 - 99 mg/dL   Total CHOL/HDL Ratio 4    NonHDL 123.30   TSH  Result Value Ref Range   TSH 0.79 0.35 - 4.50 uIU/mL  Microalbumin / creatinine urine ratio  Result Value Ref Range   Microalb, Ur 5.4 (H) 0.0 - 1.9 mg/dL   Creatinine,U 622.2 mg/dL   Microalb Creat Ratio 2.2 0.0 - 30.0 mg/g    The 10-year ASCVD risk score Denman George DC Jr., et al., 2013) is: 23%   Values used to calculate the score:     Age: 57 years     Sex: Female     Is Non-Hispanic African American: Yes     Diabetic: Yes     Tobacco smoker: Yes     Systolic Blood Pressure: 122 mmHg     Is BP treated: Yes     HDL Cholesterol: 47.2 mg/dL     Total Cholesterol: 171 mg/dL

## 2019-10-26 ENCOUNTER — Other Ambulatory Visit: Payer: Self-pay

## 2019-10-27 ENCOUNTER — Other Ambulatory Visit: Payer: Self-pay

## 2019-10-27 ENCOUNTER — Encounter: Payer: Self-pay | Admitting: Family Medicine

## 2019-10-27 ENCOUNTER — Ambulatory Visit (INDEPENDENT_AMBULATORY_CARE_PROVIDER_SITE_OTHER): Payer: 59 | Admitting: Family Medicine

## 2019-10-27 VITALS — BP 122/80 | HR 83 | Temp 96.9°F | Resp 16 | Ht 67.0 in | Wt 160.0 lb

## 2019-10-27 DIAGNOSIS — E785 Hyperlipidemia, unspecified: Secondary | ICD-10-CM

## 2019-10-27 DIAGNOSIS — E119 Type 2 diabetes mellitus without complications: Secondary | ICD-10-CM | POA: Diagnosis not present

## 2019-10-27 DIAGNOSIS — E7800 Pure hypercholesterolemia, unspecified: Secondary | ICD-10-CM

## 2019-10-27 DIAGNOSIS — Z130 Encounter for screening for diseases of the blood and blood-forming organs and certain disorders involving the immune mechanism: Secondary | ICD-10-CM | POA: Diagnosis not present

## 2019-10-27 DIAGNOSIS — Z1231 Encounter for screening mammogram for malignant neoplasm of breast: Secondary | ICD-10-CM

## 2019-10-27 DIAGNOSIS — Z1329 Encounter for screening for other suspected endocrine disorder: Secondary | ICD-10-CM | POA: Diagnosis not present

## 2019-10-27 DIAGNOSIS — Z0000 Encounter for general adult medical examination without abnormal findings: Secondary | ICD-10-CM

## 2019-10-27 DIAGNOSIS — I10 Essential (primary) hypertension: Secondary | ICD-10-CM

## 2019-10-27 DIAGNOSIS — E1169 Type 2 diabetes mellitus with other specified complication: Secondary | ICD-10-CM

## 2019-10-27 DIAGNOSIS — Z122 Encounter for screening for malignant neoplasm of respiratory organs: Secondary | ICD-10-CM

## 2019-10-27 LAB — COMPREHENSIVE METABOLIC PANEL
ALT: 9 U/L (ref 0–35)
AST: 13 U/L (ref 0–37)
Albumin: 4.2 g/dL (ref 3.5–5.2)
Alkaline Phosphatase: 90 U/L (ref 39–117)
BUN: 9 mg/dL (ref 6–23)
CO2: 30 mEq/L (ref 19–32)
Calcium: 9.3 mg/dL (ref 8.4–10.5)
Chloride: 106 mEq/L (ref 96–112)
Creatinine, Ser: 1 mg/dL (ref 0.40–1.20)
GFR: 68.46 mL/min (ref >60.00)
Glucose, Bld: 91 mg/dL (ref 70–99)
Potassium: 4.2 mEq/L (ref 3.5–5.1)
Sodium: 142 mEq/L (ref 135–145)
Total Bilirubin: 0.4 mg/dL (ref 0.2–1.2)
Total Protein: 6.4 g/dL (ref 6.0–8.3)

## 2019-10-27 LAB — LIPID PANEL
Cholesterol: 171 mg/dL (ref 0–200)
HDL: 47.2 mg/dL (ref >39.00)
LDL Cholesterol: 108 mg/dL — ABNORMAL HIGH (ref 0–99)
NonHDL: 123.3
Total CHOL/HDL Ratio: 4
Triglycerides: 75 mg/dL (ref 0.0–149.0)
VLDL: 15 mg/dL (ref 0.0–40.0)

## 2019-10-27 LAB — MICROALBUMIN / CREATININE URINE RATIO
Creatinine,U: 249.9 mg/dL
Microalb Creat Ratio: 2.2 mg/g (ref 0.0–30.0)
Microalb, Ur: 5.4 mg/dL — ABNORMAL HIGH (ref 0.0–1.9)

## 2019-10-27 LAB — HEMOGLOBIN A1C: Hgb A1c MFr Bld: 6.5 % (ref 4.6–6.5)

## 2019-10-27 LAB — TSH: TSH: 0.79 u[IU]/mL (ref 0.35–4.50)

## 2019-10-27 MED ORDER — ATORVASTATIN CALCIUM 40 MG PO TABS: 40.0000 mg | ORAL_TABLET | Freq: Every day | ORAL | 3 refills | Status: DC

## 2019-10-27 MED ORDER — LISINOPRIL 40 MG PO TABS: 40.0000 mg | ORAL_TABLET | Freq: Every day | ORAL | 3 refills | Status: DC

## 2019-10-28 ENCOUNTER — Encounter: Payer: Self-pay | Admitting: Family Medicine

## 2019-10-28 LAB — CBC
HCT: 40.9 % (ref 36.0–46.0)
Hemoglobin: 13.5 g/dL (ref 12.0–15.0)
MCHC: 32.9 g/dL (ref 30.0–36.0)
MCV: 96.4 fl (ref 78.0–100.0)
Platelets: 243 10*3/uL (ref 150.0–400.0)
RBC: 4.25 Mil/uL (ref 3.87–5.11)
RDW: 13.9 % (ref 11.5–15.5)
WBC: 6 10*3/uL (ref 4.0–10.5)

## 2019-11-05 ENCOUNTER — Telehealth (INDEPENDENT_AMBULATORY_CARE_PROVIDER_SITE_OTHER): Payer: 59 | Admitting: Family Medicine

## 2019-11-05 ENCOUNTER — Other Ambulatory Visit: Payer: Self-pay

## 2019-11-05 ENCOUNTER — Telehealth: Payer: 59 | Admitting: Family Medicine

## 2019-11-05 ENCOUNTER — Encounter: Payer: Self-pay | Admitting: Family Medicine

## 2019-11-05 VITALS — Temp 97.7°F

## 2019-11-05 DIAGNOSIS — J0100 Acute maxillary sinusitis, unspecified: Secondary | ICD-10-CM | POA: Diagnosis not present

## 2019-11-05 MED ORDER — PROMETHAZINE-DM 6.25-15 MG/5ML PO SYRP: 5.0000 mL | ORAL_SOLUTION | Freq: Four times a day (QID) | ORAL | 0 refills | Status: DC | PRN

## 2019-11-05 MED ORDER — PREDNISONE 20 MG PO TABS: 40.0000 mg | ORAL_TABLET | Freq: Every day | ORAL | 0 refills | Status: AC

## 2019-11-05 MED ORDER — FLUCONAZOLE 150 MG PO TABS: ORAL_TABLET | ORAL | 0 refills | Status: DC

## 2019-11-05 MED ORDER — AMOXICILLIN-POT CLAVULANATE 875-125 MG PO TABS: 1.0000 | ORAL_TABLET | Freq: Two times a day (BID) | ORAL | 0 refills | Status: AC

## 2019-11-05 NOTE — Progress Notes (Signed)
Chief Complaint  Patient presents with  . Cough    congestion    Tomi Bamberger Laden here for URI complaints. Due to COVID-19 pandemic, we are interacting via web portal for an electronic face-to-face visit. I verified patient's ID using 2 identifiers. Patient agreed to proceed with visit via this method. Patient is at home, I am at office. Patient and I are present for visit.   Duration: a few days  Associated symptoms: sinus congestion, sinus pain, itchy watery eyes, chest tightness and cough Denies: rhinorrhea, ear pain, ear drainage, sore throat, wheezing, shortness of breath, myalgia and fevers Treatment to date: Zyrtec, throat spray Sick contacts: Yes- grandchildren   Past Medical History:  Diagnosis Date  . Acid reflux   . Diabetes type 2, controlled (HCC) 04/16/2016  . High cholesterol   . Hypertension     Temp 97.7 F (36.5 C) (Oral)  No conversational dyspnea Age appropriate judgment and insight Nml affect and mood  Acute maxillary sinusitis, recurrence not specified - Plan: amoxicillin-clavulanate (AUGMENTIN) 875-125 MG tablet, predniSONE (DELTASONE) 20 MG tablet, fluconazole (DIFLUCAN) 150 MG tablet, promethazine-dextromethorphan (PROMETHAZINE-DM) 6.25-15 MG/5ML syrup  I think she has sinus pressure/pain 2/2 viral URI or allergies. Supp care w pocket rx. Diflucan should she need it from abx.  Continue to push fluids, practice good hand hygiene, cover mouth when coughing. F/u prn. If starting to experience fevers, shaking, or shortness of breath, seek immediate care. Pt voiced understanding and agreement to the plan.  Jilda Roche [place], DO 11/05/19 1:22 PM

## 2019-11-08 ENCOUNTER — Encounter (HOSPITAL_BASED_OUTPATIENT_CLINIC_OR_DEPARTMENT_OTHER): Payer: Self-pay

## 2019-11-08 ENCOUNTER — Ambulatory Visit (HOSPITAL_BASED_OUTPATIENT_CLINIC_OR_DEPARTMENT_OTHER)
Admission: RE | Admit: 2019-11-08 | Discharge: 2019-11-08 | Disposition: A | Discharge status: Home / Self Care | Payer: 59 | Source: Ambulatory Visit | Attending: Family Medicine | Admitting: Family Medicine

## 2019-11-08 ENCOUNTER — Other Ambulatory Visit: Payer: Self-pay

## 2019-11-08 DIAGNOSIS — Z1231 Encounter for screening mammogram for malignant neoplasm of breast: Secondary | ICD-10-CM | POA: Diagnosis present

## 2019-11-08 DIAGNOSIS — Z122 Encounter for screening for malignant neoplasm of respiratory organs: Secondary | ICD-10-CM | POA: Diagnosis not present

## 2019-11-09 ENCOUNTER — Encounter: Payer: Self-pay | Admitting: Family Medicine

## 2020-04-25 ENCOUNTER — Other Ambulatory Visit: Payer: Self-pay | Admitting: Family Medicine

## 2020-04-29 NOTE — Progress Notes (Addendum)
[facility] [address], [address] [place], [place] [address] [id number]  Date:  05/01/2020   Name:  Margaret Sims   DOB:  [birth year]   MRN:  [id number]  PCP:  Pearline Cables, MD    Chief Complaint: Diabetes (6 month follow up, discuss glucose level, neuropathy in bilateral feet. ), Flu Vaccine, and Nicotine Dependence   History of Present Illness:  Margaret Sims is a 59 y.o. very pleasant female patient who presents with the following:  Pt here today for periodic follow-up visit History of hypertension, diet controlled diabetes, dyslipidemia, smoking  Last seen by myself in April for her physical At that time A1c showed good control blood sugars, 6.5% CT lung cancer screening done in May, negative  She is smoking- she used chantix in the past with success.  She would like to use this again   Eye exam- she went in the last month or so Foot exam due- update today  Flu shot- give today  Repeat A1c today COVID series is done, can suggest booster- she plans to do this  Shingrix Pap 2019- normal   Aspirin Lipitor Lisinopril  She is generally doing well, except for some aches and pains in her lower back She will notice some tingling/ burning in her toes.  Not numb per se Patient Active Problem List   Diagnosis Date Noted  . Diabetes type 2, controlled (HCC) 04/16/2016  . Essential hypertension 02/19/2016  . Dyslipidemia 02/19/2016  . Tobacco use disorder 02/19/2016    Past Medical History:  Diagnosis Date  . Acid reflux   . Diabetes type 2, controlled (HCC) 04/16/2016  . High cholesterol   . Hypertension     Past Surgical History:  Procedure Laterality Date  . CESAREAN SECTION      Social History   Tobacco Use  . Smoking status: Current Every Day Smoker    Packs/day: 0.50    Types: Cigarettes  . Smokeless tobacco: Never Used  Vaping Use  . Vaping Use: Never used  Substance Use Topics  .  Alcohol use: Yes    Comment: 1-2 times per week  . Drug use: Never    No family history on file.  No Known Allergies  Medication list has been reviewed and updated.  Current Outpatient Medications on File Prior to Visit  Medication Sig Dispense Refill  . aspirin 81 MG tablet Take 81 mg by mouth daily.    Marland Kitchen atorvastatin (LIPITOR) 40 MG tablet Take 1 tablet (40 mg total) by mouth daily. 90 tablet 3  . lisinopril (ZESTRIL) 40 MG tablet Take 1 tablet (40 mg total) by mouth daily. 90 tablet 3  . omeprazole (PRILOSEC) 40 MG capsule Take 1 capsule (40 mg total) by mouth daily. 90 capsule 3   No current facility-administered medications on file prior to visit.    Review of Systems:  As per HPI- otherwise negative.   Physical Examination: Vitals:   05/01/20 0946  BP: (!) 128/92  Pulse: 87  Resp: 16  SpO2: 97%   Vitals:   05/01/20 0946  Weight: 160 lb (72.6 kg)  Height: 5\' 7"  (1.702 m)   Body mass index is 25.06 kg/m. Ideal Body Weight: Weight in (lb) to have BMI = 25: 159.3  GEN: no acute distress.  Normal weight, looks well HEENT: Atraumatic, Normocephalic.  Bilateral TM wnl, oropharynx normal.  PEERL,EOMI.   Ears and Nose: No external deformity.  CV: RRR, No M/G/R. No JVD. No thrill. No extra heart sounds. PULM: CTA B, no wheezes, crackles, rhonchi. No retractions. No resp. distress. No accessory muscle use. ABD: S, NT, ND, +BS. No rebound. No HSM. EXTR: No c/c/e PSYCH: Normally interactive. Conversant.  Foot exam- normal except pt could not sense right 4th toe monofilament testing  Assessment and Plan: Controlled type 2 diabetes mellitus without complication, without long-term current use of insulin (HCC) - Plan: Hemoglobin A1c  Essential hypertension  Hyperlipidemia associated with type 2 diabetes mellitus (HCC) - Plan: Basic metabolic panel  Needs flu shot - Plan: Flu Vaccine QUAD 6+ mos PF IM (Fluarix Quad PF)  Urinary frequency - Plan: Urine Culture,  Microalbumin / creatinine urine ratio  Tobacco use disorder - Plan: varenicline (CHANTIX STARTING MONTH PAK) 0.5 MG X 11 & 1 MG X 42 tablet, varenicline (CHANTIX CONTINUING MONTH PAK) 1 MG tablet  Type 2 diabetes mellitus with diabetic neuropathy, without long-term current use of insulin (HCC)     Here today for 41-month follow-up, will be in touch with her labs as soon as possible Some evidence of mild diabetic neuropathy.  At this point it is not all that bothersome to patient, if it becomes worse we can try gabapentin Flu shot given She would like to try the Chantix again for smoking cessation.  We discussed how to use this medication and I wrote a prescription Will plan further follow- up pending labs. This visit occurred during the SARS-CoV-2 public health emergency.  Safety protocols were in place, including screening questions prior to the visit, additional usage of staff PPE, and extensive cleaning of exam room while observing appropriate contact time as indicated for disinfecting solutions.      Signed Abbe Amsterdam, MD  Addendum 10/26, received her labs as below.  Message to patient  Results for orders placed or performed in visit on 05/01/20  Basic metabolic panel  Result Value Ref Range   Glucose, Bld 79 65 - 99 mg/dL   BUN 10 7 - 25 mg/dL   Creat 0.81 4.48 - 1.85 mg/dL   BUN/Creatinine Ratio NOT APPLICABLE 6 - 22 (calc)   Sodium 142 135 - 146 mmol/L   Potassium 4.1 3.5 - 5.3 mmol/L   Chloride 108 98 - 110 mmol/L   CO2 29 20 - 32 mmol/L   Calcium 9.4 8.6 - 10.4 mg/dL  Hemoglobin U3J  Result Value Ref Range   Hgb A1c MFr Bld 6.2 (H) <5.7 % of total Hgb   Mean Plasma Glucose 131 (calc)   eAG (mmol/L) 7.3 (calc)  Microalbumin / creatinine urine ratio  Result Value Ref Range   Creatinine, Urine 149 20 - 275 mg/dL   Microalb, Ur 1.7 mg/dL   Microalb Creat Ratio 11 <30 mcg/mg creat

## 2020-04-29 NOTE — Patient Instructions (Addendum)
It was great to see you again today, I will be in touch with your labs Please see me in about 6 months for physical  Start you your chantix starter pack about one week prior to the date you would like to quit smoking.  After you finish the starter pack, use the regular pack for a total of 3-6 months depending on how long you feel like you need it  We will check for any sign of a UTI or worsening control of your diabetes today  Try some "heel cups" for your shoes, this may help ease your heel discomfort  You do seem to have some mild neuropathy in your feet.  If this becomes more bothersome we can try some medication for the discomfort

## 2020-05-01 ENCOUNTER — Ambulatory Visit (INDEPENDENT_AMBULATORY_CARE_PROVIDER_SITE_OTHER): Payer: 59 | Admitting: Family Medicine

## 2020-05-01 ENCOUNTER — Encounter: Payer: Self-pay | Admitting: Family Medicine

## 2020-05-01 ENCOUNTER — Other Ambulatory Visit: Payer: Self-pay

## 2020-05-01 VITALS — BP 128/92 | HR 87 | Resp 16 | Ht 67.0 in | Wt 160.0 lb

## 2020-05-01 DIAGNOSIS — Z23 Encounter for immunization: Secondary | ICD-10-CM | POA: Diagnosis not present

## 2020-05-01 DIAGNOSIS — E119 Type 2 diabetes mellitus without complications: Secondary | ICD-10-CM

## 2020-05-01 DIAGNOSIS — R350 Frequency of micturition: Secondary | ICD-10-CM | POA: Diagnosis not present

## 2020-05-01 DIAGNOSIS — E785 Hyperlipidemia, unspecified: Secondary | ICD-10-CM

## 2020-05-01 DIAGNOSIS — I10 Essential (primary) hypertension: Secondary | ICD-10-CM | POA: Diagnosis not present

## 2020-05-01 DIAGNOSIS — E1140 Type 2 diabetes mellitus with diabetic neuropathy, unspecified: Secondary | ICD-10-CM

## 2020-05-01 DIAGNOSIS — E1169 Type 2 diabetes mellitus with other specified complication: Secondary | ICD-10-CM | POA: Diagnosis not present

## 2020-05-01 DIAGNOSIS — F17200 Nicotine dependence, unspecified, uncomplicated: Secondary | ICD-10-CM

## 2020-05-01 HISTORY — DX: Type 2 diabetes mellitus with diabetic neuropathy, unspecified: E11.40

## 2020-05-01 MED ORDER — CHANTIX STARTING MONTH PAK 0.5 MG X 11 & 1 MG X 42 PO TABS: ORAL_TABLET | ORAL | 0 refills | Status: DC

## 2020-05-01 MED ORDER — VARENICLINE TARTRATE 1 MG PO TABS: 1.0000 mg | ORAL_TABLET | Freq: Two times a day (BID) | ORAL | 3 refills | Status: DC

## 2020-05-02 ENCOUNTER — Encounter: Payer: Self-pay | Admitting: Family Medicine

## 2020-05-02 LAB — HEMOGLOBIN A1C
Hgb A1c MFr Bld: 6.2 % of total Hgb — ABNORMAL HIGH (ref <5.7)
Mean Plasma Glucose: 131 (calc)
eAG (mmol/L): 7.3 (calc)

## 2020-05-02 LAB — URINE CULTURE
MICRO NUMBER:: 11113460
Result:: NO GROWTH
SPECIMEN QUALITY:: ADEQUATE

## 2020-05-02 LAB — BASIC METABOLIC PANEL
BUN: 10 mg/dL (ref 7–25)
CO2: 29 mmol/L (ref 20–32)
Calcium: 9.4 mg/dL (ref 8.6–10.4)
Chloride: 108 mmol/L (ref 98–110)
Creat: 0.91 mg/dL (ref 0.50–0.99)
Glucose, Bld: 79 mg/dL (ref 65–99)
Potassium: 4.1 mmol/L (ref 3.5–5.3)
Sodium: 142 mmol/L (ref 135–146)

## 2020-05-02 LAB — MICROALBUMIN / CREATININE URINE RATIO
Creatinine, Urine: 149 mg/dL (ref 20–275)
Microalb Creat Ratio: 11 mcg/mg creat (ref <30)
Microalb, Ur: 1.7 mg/dL

## 2020-06-05 ENCOUNTER — Telehealth: Payer: Self-pay | Admitting: Family Medicine

## 2020-06-05 NOTE — Telephone Encounter (Signed)
Sent [employer]. Patient needs appointment.

## 2020-06-05 NOTE — Telephone Encounter (Signed)
Patient states she has thrown her back out and states she needs a muscle relaxer for pain.   Please Advise

## 2020-06-06 ENCOUNTER — Other Ambulatory Visit: Payer: Self-pay

## 2020-06-06 ENCOUNTER — Ambulatory Visit (INDEPENDENT_AMBULATORY_CARE_PROVIDER_SITE_OTHER): Payer: 59 | Admitting: Family Medicine

## 2020-06-06 ENCOUNTER — Encounter: Payer: Self-pay | Admitting: Family Medicine

## 2020-06-06 VITALS — BP 140/90 | HR 91 | Temp 97.9°F | Ht 67.0 in | Wt 159.5 lb

## 2020-06-06 DIAGNOSIS — M545 Low back pain, unspecified: Secondary | ICD-10-CM

## 2020-06-06 DIAGNOSIS — R030 Elevated blood-pressure reading, without diagnosis of hypertension: Secondary | ICD-10-CM | POA: Diagnosis not present

## 2020-06-06 LAB — POC URINALSYSI DIPSTICK (AUTOMATED)
Bilirubin, UA: NEGATIVE
Blood, UA: POSITIVE
Glucose, UA: NEGATIVE
Ketones, UA: NEGATIVE
Leukocytes, UA: NEGATIVE
Nitrite, UA: NEGATIVE
Protein, UA: POSITIVE — AB
Spec Grav, UA: >=1.03 — AB (ref 1.010–1.025)
Urobilinogen, UA: 0.2 E.U./dL
pH, UA: 6 (ref 5.0–8.0)

## 2020-06-06 MED ORDER — MELOXICAM 7.5 MG PO TABS: 7.5000 mg | ORAL_TABLET | Freq: Every day | ORAL | 0 refills | Status: DC

## 2020-06-06 MED ORDER — CYCLOBENZAPRINE HCL 10 MG PO TABS: 5.0000 mg | ORAL_TABLET | Freq: Three times a day (TID) | ORAL | 0 refills | Status: DC | PRN

## 2020-06-06 MED ORDER — TRAMADOL HCL 50 MG PO TABS: 50.0000 mg | ORAL_TABLET | Freq: Three times a day (TID) | ORAL | 0 refills | Status: AC | PRN

## 2020-06-06 NOTE — Progress Notes (Signed)
Musculoskeletal Exam  Patient: Margaret Sims DOB: August 10, 1960  DOS: 06/06/2020  SUBJECTIVE:  Chief Complaint:   Chief Complaint  Patient presents with  . Back Pain    Margaret Sims is a 59 y.o.  female for evaluation and treatment of her back pain.   Onset:  5 days ago. 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  Location: lower Character:  sharp  Progression of issue:  is unchanged Associated symptoms: urinary freq and urgency; no bruising, swelling, redness, bleeding, fevers, drainage. No personal or famhx of kidney infxn.  Denies bowel/bladder incontinence or weakness Treatment: to date has been OTC NSAIDS, Salonpas and acetaminophen.   Neurovascular symptoms: no  Pt w hx of HTN. She is compliant w lisinopril 40 mg/d. Does not monitor home BP's routinely.   Past Medical History:  Diagnosis Date  . Acid reflux   . Diabetes type 2, controlled (HCC) 04/16/2016  . High cholesterol   . Hypertension     Objective:  VITAL SIGNS: BP 140/90 (BP Location: Left Arm, Patient Position: Sitting, Cuff Size: Normal)   Pulse 91   Temp 97.9 F (36.6 C) (Oral)   Ht 5\' 7"  (1.702 m)   Wt 159 lb 8 oz (72.3 kg)   SpO2 93%   BMI 24.98 kg/m  Constitutional: Well formed, well developed. No acute distress. HENT: Normocephalic, atraumatic.  Thorax & Lungs:  No accessory muscle use Skin: Warm. Dry. No erythema. No rash.  Musculoskeletal: low back.   Tenderness to palpation: yes over lower lumbar parasp msc b/l and over L CVA region Deformity: no Ecchymosis: no Straight leg test: negative for OK hamstring flexibility b/l. Neurologic: Normal sensory function. No focal deficits noted. DTR's equal and symmetric in LE's. No clonus. Psychiatric: Normal mood. Age appropriate judgment and insight. Alert & oriented x 3.    Assessment:  Acute bilateral low back pain without sciatica - Plan: cyclobenzaprine (FLEXERIL) 10 MG tablet, meloxicam (MOBIC) 7.5 MG tablet, traMADol  (ULTRAM) 50 MG tablet, POCT Urinalysis Dipstick (Automated), Urine Culture, Urine Microscopic Only  Elevated blood pressure reading  Plan: 1. Mobic, Flexeril, tramadol for breakthrough pain. Warnings about Flexeirl and tramadol verbalized and written down. Stretches/exercises, heat, ice, Tylenol. UA neg for infection, does show trace blood, will f/u with microscopy.  2. Monitor BP at home. If >140/90, will f/u with Dr. .  F/u prn. The patient voiced understanding and agreement to the plan.   Margaret Lager [place], DO 06/06/20  4:18 PM

## 2020-06-06 NOTE — Patient Instructions (Addendum)
Check your blood pressure at home. If >140/90 consistently, please make an appointment with Dr. Patsy Lager.   OK to take Tylenol 1000 mg (2 extra strength tabs) or 975 mg (3 regular strength tabs) every 6 hours as needed.  No ibuprofen while on the meloxicam.  Heat (pad or rice pillow in microwave) over affected area, 10-15 minutes twice daily.   Ice/cold pack over area for 10-15 min twice daily.  Do not drink alcohol, do any illicit/street drugs, drive or do anything that requires alertness while on the tramadol.   Let us know if you need anything.  EXERCISES  RANGE OF MOTION (ROM) AND STRETCHING EXERCISES - Low Back Pain Most people with lower back pain will find that their symptoms get worse with excessive bending forward (flexion) or arching at the lower back (extension). The exercises that will help resolve your symptoms will focus on the opposite motion.  If you have pain, numbness or tingling which travels down into your buttocks, leg or foot, the goal of the therapy is for these symptoms to move closer to your back and eventually resolve. Sometimes, these leg symptoms will get better, but your lower back pain may worsen. This is often an indication of progress in your rehabilitation. Be very alert to any changes in your symptoms and the activities in which you participated in the 24 hours prior to the change. Sharing this information with your caregiver will allow him or her to most efficiently treat your condition. These exercises may help you when beginning to rehabilitate your injury. Your symptoms may resolve with or without further involvement from your physician, physical therapist or athletic trainer. While completing these exercises, remember:   Restoring tissue flexibility helps normal motion to return to the joints. This allows healthier, less painful movement and activity.  An effective stretch should be held for at least 30 seconds.  A stretch should never be painful. You  should only feel a gentle lengthening or release in the stretched tissue. FLEXION RANGE OF MOTION AND STRETCHING EXERCISES:  STRETCH - Flexion, Single Knee to Chest   Lie on a firm bed or floor with both legs extended in front of you.  Keeping one leg in contact with the floor, bring your opposite knee to your chest. Hold your leg in place by either grabbing behind your thigh or at your knee.  Pull until you feel a gentle stretch in your low back. Hold 30 seconds.  Slowly release your grasp and repeat the exercise with the opposite side. Repeat 2 times. Complete this exercise 3 times per week.   STRETCH - Flexion, Double Knee to Chest  Lie on a firm bed or floor with both legs extended in front of you.  Keeping one leg in contact with the floor, bring your opposite knee to your chest.  Tense your stomach muscles to support your back and then lift your other knee to your chest. Hold your legs in place by either grabbing behind your thighs or at your knees.  Pull both knees toward your chest until you feel a gentle stretch in your low back. Hold 30 seconds.  Tense your stomach muscles and slowly return one leg at a time to the floor. Repeat 2 times. Complete this exercise 3 times per week.   STRETCH - Low Trunk Rotation  Lie on a firm bed or floor. Keeping your legs in front of you, bend your knees so they are both pointed toward the ceiling and your feet are  flat on the floor.  Extend your arms out to the side. This will stabilize your upper body by keeping your shoulders in contact with the floor.  Gently and slowly drop both knees together to one side until you feel a gentle stretch in your low back. Hold for 30 seconds.  Tense your stomach muscles to support your lower back as you bring your knees back to the starting position. Repeat the exercise to the other side. Repeat 2 times. Complete this exercise at least 3 times per week.   EXTENSION RANGE OF MOTION AND FLEXIBILITY  EXERCISES:  STRETCH - Extension, Prone on Elbows   Lie on your stomach on the floor, a bed will be too soft. Place your palms about shoulder width apart and at the height of your head.  Place your elbows under your shoulders. If this is too painful, stack pillows under your chest.  Allow your body to relax so that your hips drop lower and make contact more completely with the floor.  Hold this position for 30 seconds.  Slowly return to lying flat on the floor. Repeat 2 times. Complete this exercise 3 times per week.   RANGE OF MOTION - Extension, Prone Press Ups  Lie on your stomach on the floor, a bed will be too soft. Place your palms about shoulder width apart and at the height of your head.  Keeping your back as relaxed as possible, slowly straighten your elbows while keeping your hips on the floor. You may adjust the placement of your hands to maximize your comfort. As you gain motion, your hands will come more underneath your shoulders.  Hold this position 30 seconds.  Slowly return to lying flat on the floor. Repeat 2 times. Complete this exercise 3 times per week.   RANGE OF MOTION- Quadruped, Neutral Spine   Assume a hands and knees position on a firm surface. Keep your hands under your shoulders and your knees under your hips. You may place padding under your knees for comfort.  Drop your head and point your tailbone toward the ground below you. This will round out your lower back like an angry cat. Hold this position for 30 seconds.  Slowly lift your head and release your tail bone so that your back sags into a large arch, like an old horse.  Hold this position for 30 seconds.  Repeat this until you feel limber in your low back.  Now, find your "sweet spot." This will be the most comfortable position somewhere between the two previous positions. This is your neutral spine. Once you have found this position, tense your stomach muscles to support your low back.  Hold  this position for 30 seconds. Repeat 2 times. Complete this exercise 3 times per week.   STRENGTHENING EXERCISES - Low Back Sprain These exercises may help you when beginning to rehabilitate your injury. These exercises should be done near your "sweet spot." This is the neutral, low-back arch, somewhere between fully rounded and fully arched, that is your least painful position. When performed in this safe range of motion, these exercises can be used for people who have either a flexion or extension based injury. These exercises may resolve your symptoms with or without further involvement from your physician, physical therapist or athletic trainer. While completing these exercises, remember:   Muscles can gain both the endurance and the strength needed for everyday activities through controlled exercises.  Complete these exercises as instructed by your physician, physical therapist or  Event organiser. Increase the resistance and repetitions only as guided.  You may experience muscle soreness or fatigue, but the pain or discomfort you are trying to eliminate should never worsen during these exercises. If this pain does worsen, stop and make certain you are following the directions exactly. If the pain is still present after adjustments, discontinue the exercise until you can discuss the trouble with your caregiver.  STRENGTHENING - Deep Abdominals, Pelvic Tilt   Lie on a firm bed or floor. Keeping your legs in front of you, bend your knees so they are both pointed toward the ceiling and your feet are flat on the floor.  Tense your lower abdominal muscles to press your low back into the floor. This motion will rotate your pelvis so that your tail bone is scooping upwards rather than pointing at your feet or into the floor. With a gentle tension and even breathing, hold this position for 3 seconds. Repeat 2 times. Complete this exercise 3 times per week.   STRENGTHENING - Abdominals, Crunches    Lie on a firm bed or floor. Keeping your legs in front of you, bend your knees so they are both pointed toward the ceiling and your feet are flat on the floor. Cross your arms over your chest.  Slightly tip your chin down without bending your neck.  Tense your abdominals and slowly lift your trunk high enough to just clear your shoulder blades. Lifting higher can put excessive stress on the lower back and does not further strengthen your abdominal muscles.  Control your return to the starting position. Repeat 2 times. Complete this exercise 3 times per week.   STRENGTHENING - Quadruped, Opposite UE/LE Lift   Assume a hands and knees position on a firm surface. Keep your hands under your shoulders and your knees under your hips. You may place padding under your knees for comfort.  Find your neutral spine and gently tense your abdominal muscles so that you can maintain this position. Your shoulders and hips should form a rectangle that is parallel with the floor and is not twisted.  Keeping your trunk steady, lift your right hand no higher than your shoulder and then your left leg no higher than your hip. Make sure you are not holding your breath. Hold this position for 30 seconds.  Continuing to keep your abdominal muscles tense and your back steady, slowly return to your starting position. Repeat with the opposite arm and leg. Repeat 2 times. Complete this exercise 3 times per week.   STRENGTHENING - Abdominals and Quadriceps, Straight Leg Raise   Lie on a firm bed or floor with both legs extended in front of you.  Keeping one leg in contact with the floor, bend the other knee so that your foot can rest flat on the floor.  Find your neutral spine, and tense your abdominal muscles to maintain your spinal position throughout the exercise.  Slowly lift your straight leg off the floor about 6 inches for a count of 3, making sure to not hold your breath.  Still keeping your neutral  spine, slowly lower your leg all the way to the floor. Repeat this exercise with each leg 2 times. Complete this exercise 3 times per week.  POSTURE AND BODY MECHANICS CONSIDERATIONS - Low Back Sprain Keeping correct posture when sitting, standing or completing your activities will reduce the stress put on different body tissues, allowing injured tissues a chance to heal and limiting painful experiences. The following are  general guidelines for improved posture.  While reading these guidelines, remember:  The exercises prescribed by your provider will help you have the flexibility and strength to maintain correct postures.  The correct posture provides the best environment for your joints to work. All of your joints have less wear and tear when properly supported by a spine with good posture. This means you will experience a healthier, less painful body.  Correct posture must be practiced with all of your activities, especially prolonged sitting and standing. Correct posture is as important when doing repetitive low-stress activities (typing) as it is when doing a single heavy-load activity (lifting).  RESTING POSITIONS Consider which positions are most painful for you when choosing a resting position. If you have pain with flexion-based activities (sitting, bending, stooping, squatting), choose a position that allows you to rest in a less flexed posture. You would want to avoid curling into a fetal position on your side. If your pain worsens with extension-based activities (prolonged standing, working overhead), avoid resting in an extended position such as sleeping on your stomach. Most people will find more comfort when they rest with their spine in a more neutral position, neither too rounded nor too arched. Lying on a non-sagging bed on your side with a pillow between your knees, or on your back with a pillow under your knees will often provide some relief. Keep in mind, being in any one position  for a prolonged period of time, no matter how correct your posture, can still lead to stiffness.  PROPER SITTING POSTURE In order to minimize stress and discomfort on your spine, you must sit with correct posture. Sitting with good posture should be effortless for a healthy body. Returning to good posture is a gradual process. Many people can work toward this most comfortably by using various supports until they have the flexibility and strength to maintain this posture on their own. When sitting with proper posture, your ears will fall over your shoulders and your shoulders will fall over your hips. You should use the back of the chair to support your upper back. Your lower back will be in a neutral position, just slightly arched. You may place a small pillow or folded towel at the base of your lower back for  support.  When working at a desk, create an environment that supports good, upright posture. Without extra support, muscles tire, which leads to excessive strain on joints and other tissues. Keep these recommendations in mind:  CHAIR:  A chair should be able to slide under your desk when your back makes contact with the back of the chair. This allows you to work closely.  The chair's height should allow your eyes to be level with the upper part of your monitor and your hands to be slightly lower than your elbows.  BODY POSITION  Your feet should make contact with the floor. If this is not possible, use a foot rest.  Keep your ears over your shoulders. This will reduce stress on your neck and low back.  INCORRECT SITTING POSTURES  If you are feeling tired and unable to assume a healthy sitting posture, do not slouch or slump. This puts excessive strain on your back tissues, causing more damage and pain. Healthier options include:  Using more support, like a lumbar pillow.  Switching tasks to something that requires you to be upright or walking.  Talking a brief walk.  Lying down to  rest in a neutral-spine position.  PROLONGED STANDING WHILE  SLIGHTLY LEANING FORWARD  When completing a task that requires you to lean forward while standing in one place for a long time, place either foot up on a stationary 2-4 inch high object to help maintain the best posture. When both feet are on the ground, the lower back tends to lose its slight inward curve. If this curve flattens (or becomes too large), then the back and your other joints will experience too much stress, tire more quickly, and can cause pain.  CORRECT STANDING POSTURES Proper standing posture should be assumed with all daily activities, even if they only take a few moments, like when brushing your teeth. As in sitting, your ears should fall over your shoulders and your shoulders should fall over your hips. You should keep a slight tension in your abdominal muscles to brace your spine. Your tailbone should point down to the ground, not behind your body, resulting in an over-extended swayback posture.   INCORRECT STANDING POSTURES  Common incorrect standing postures include a forward head, locked knees and/or an excessive swayback. WALKING Walk with an upright posture. Your ears, shoulders and hips should all line-up.  PROLONGED ACTIVITY IN A FLEXED POSITION When completing a task that requires you to bend forward at your waist or lean over a low surface, try to find a way to stabilize 3 out of 4 of your limbs. You can place a hand or elbow on your thigh or rest a knee on the surface you are reaching across. This will provide you more stability, so that your muscles do not tire as quickly. By keeping your knees relaxed, or slightly bent, you will also reduce stress across your lower back. CORRECT LIFTING TECHNIQUES  DO :  Assume a wide stance. This will provide you more stability and the opportunity to get as close as possible to the object which you are lifting.  Tense your abdominals to brace your spine. Bend at the  knees and hips. Keeping your back locked in a neutral-spine position, lift using your leg muscles. Lift with your legs, keeping your back straight.  Test the weight of unknown objects before attempting to lift them.  Try to keep your elbows locked down at your sides in order get the best strength from your shoulders when carrying an object.     Always ask for help when lifting heavy or awkward objects. INCORRECT LIFTING TECHNIQUES DO NOT:   Lock your knees when lifting, even if it is a small object.  Bend and twist. Pivot at your feet or move your feet when needing to change directions.  Assume that you can safely pick up even a paperclip without proper posture.

## 2020-06-07 LAB — URINE CULTURE
MICRO NUMBER:: 11257494
Result:: NO GROWTH
SPECIMEN QUALITY:: ADEQUATE

## 2020-06-07 LAB — URINALYSIS, MICROSCOPIC ONLY

## 2020-07-05 ENCOUNTER — Encounter: Payer: Self-pay | Admitting: Family Medicine

## 2020-10-27 NOTE — Progress Notes (Addendum)
[facility] [address], [address] [place], [place] [address] [phone]  Date:  10/30/2020   Name:  Margaret Sims   DOB:  October 07, 1960   MRN:  [id number]  PCP:  Pearline Cables, MD    Chief Complaint: Annual Exam   History of Present Illness:  Margaret Sims is a 60 y.o. very pleasant female patient who presents with the following:  Here today for physical, last seen by myself in October She also saw my partner Dr. Carmelia Roller in November with concern of back pain  She notes intermittent lower back pain for several years No plain x-rays done yet  No worsening of her foot neuropathy sx  Pain does not radiate down her legs No changes in bowel or bladder control She is not taking anything for her back right now She used some flexeril and mobic in the past for her back - these did help but she is not longer taking   History of Diet-controlled diabetes with neuropathy, hypertension, dyslipidemia, smoking She did go back to smoking again, she wants to quit and continues to work on this.  She cannot tolerate chantix- has tried it before She is smoking about 1/2 ppd   Eye exam- December  Foot exam now due COVID-19 booster Can update Pap smear-completed 2019, negative HPV-we will update today Mammogram due next month- however she likes to do q 2 years  Shingrix- we discussed today.  Will start today   Lisinopril Asa Atorvastatin 40 prilosec   Patient Active Problem List   Diagnosis Date Noted  . Type 2 diabetes mellitus with diabetic neuropathy, unspecified (HCC) 05/01/2020  . Essential hypertension 02/19/2016  . Dyslipidemia 02/19/2016  . Tobacco use disorder 02/19/2016    Past Medical History:  Diagnosis Date  . Acid reflux   . Diabetes type 2, controlled (HCC) 04/16/2016  . High cholesterol   . Hypertension     Past Surgical History:  Procedure Laterality Date  . CESAREAN SECTION      Social History    Tobacco Use  . Smoking status: Current Every Day Smoker    Packs/day: 0.50    Types: Cigarettes  . Smokeless tobacco: Never Used  Vaping Use  . Vaping Use: Never used  Substance Use Topics  . Alcohol use: Yes    Comment: 1-2 times per week  . Drug use: Never    History reviewed. No pertinent family history.  No Known Allergies  Medication list has been reviewed and updated.  Current Outpatient Medications on File Prior to Visit  Medication Sig Dispense Refill  . aspirin 81 MG tablet Take 81 mg by mouth daily.    Marland Kitchen atorvastatin (LIPITOR) 40 MG tablet Take 1 tablet (40 mg total) by mouth daily. 90 tablet 3  . lisinopril (ZESTRIL) 40 MG tablet Take 1 tablet (40 mg total) by mouth daily. 90 tablet 3  . omeprazole (PRILOSEC) 40 MG capsule Take 1 capsule (40 mg total) by mouth daily. 90 capsule 3   No current facility-administered medications on file prior to visit.    Review of Systems:  As per HPI- otherwise negative.   Physical Examination: Vitals:   10/30/20 0955  BP: (!) 152/98  Pulse: 76  Resp: 15  Temp: 98.1 F (36.7 C)  SpO2: 99%   Vitals:   10/30/20 0955  Weight: 160 lb (72.6 kg)  Height: 5\' 7"  (1.702 m)   Body mass index is  25.06 kg/m. Ideal Body Weight: Weight in (lb) to have BMI = 25: 159.3  GEN: no acute distress.  Minimal overweight, looks well  HEENT: Atraumatic, Normocephalic.  Ears and Nose: No external deformity. CV: RRR, No M/G/R. No JVD. No thrill. No extra heart sounds. PULM: CTA B, no wheezes, crackles, rhonchi. No retractions. No resp. distress. No accessory muscle use. ABD: S, NT, ND, +BS. No rebound. No HSM. EXTR: No c/c/e PSYCH: Normally interactive. Conversant.  Foot exam- normal GYN exam performed today, normal internal and external genitalia.  She does have to macular hyperpigmented skin lesions on the left buttock adjacent to the anus.  The patient is not sure how long these have been there, she cannot see them due to their  location  Assessment and Plan: Physical exam  Essential hypertension - Plan: CBC, Comprehensive metabolic panel  Hyperlipidemia associated with type 2 diabetes mellitus (HCC) - Plan: Lipid panel  Controlled type 2 diabetes mellitus without complication, without long-term current use of insulin (HCC) - Plan: Hemoglobin A1c  Screening for thyroid disorder - Plan: TSH  Fatigue, unspecified type - Plan: TSH, VITAMIN D 25 Hydroxy (Vit-D Deficiency, Fractures)  Screening for cervical cancer - Plan: Cytology - PAP, CANCELED: Cytology - PAP  Immunization due - Plan: Varicella-zoster vaccine IM (Shingrix)  Skin change - Plan: Ambulatory referral to Dermatology   Here today for physical exam.  I will be in touch with her labs as soon as possible Pap completed Mammogram, recommend doing at least every 2 years Start shingles series today Referral to dermatology to evaluate hyperpigmented skin lesion on her buttock Encouraged healthy diet and exercise routine Noted blood pressure slightly elevated, I have asked her to monitor blood pressure at home for couple of weeks and send me a message with her results.  We may need to adjust her medication Will plan further follow- up pending labs.  This visit occurred during the SARS-CoV-2 public health emergency.  Safety protocols were in place, including screening questions prior to the visit, additional usage of staff PPE, and extensive cleaning of exam room while observing appropriate contact time as indicated for disinfecting solutions.   Signed Abbe Amsterdam, MD  Received her labs- message to pt Results for orders placed or performed in visit on 10/30/20  CBC  Result Value Ref Range   WBC 6.1 4.0 - 10.5 K/uL   RBC 4.45 3.87 - 5.11 Mil/uL   Platelets 256.0 150.0 - 400.0 K/uL   Hemoglobin 14.3 12.0 - 15.0 g/dL   HCT 67.8 93.8 - 10.1 %   MCV 96.2 78.0 - 100.0 fl   MCHC 33.4 30.0 - 36.0 g/dL   RDW 75.1 02.5 - 85.2 %  Comprehensive  metabolic panel  Result Value Ref Range   Sodium 141 135 - 145 mEq/L   Potassium 4.1 3.5 - 5.1 mEq/L   Chloride 106 96 - 112 mEq/L   CO2 28 19 - 32 mEq/L   Glucose, Bld 83 70 - 99 mg/dL   BUN 11 6 - 23 mg/dL   Creatinine, Ser 7.78 0.40 - 1.20 mg/dL   Total Bilirubin 0.6 0.2 - 1.2 mg/dL   Alkaline Phosphatase 93 39 - 117 U/L   AST 18 0 - 37 U/L   ALT 12 0 - 35 U/L   Total Protein 6.8 6.0 - 8.3 g/dL   Albumin 4.2 3.5 - 5.2 g/dL   GFR 24.23 >53.61 mL/min   Calcium 9.7 8.4 - 10.5 mg/dL  Hemoglobin W4R  Result  Value Ref Range   Hgb A1c MFr Bld 6.4 4.6 - 6.5 %  Lipid panel  Result Value Ref Range   Cholesterol 191 0 - 200 mg/dL   Triglycerides 789.3 0.0 - 149.0 mg/dL   HDL 81.01 >75.10 mg/dL   VLDL 25.8 0.0 - 52.7 mg/dL   LDL Cholesterol 782 (H) 0 - 99 mg/dL   Total CHOL/HDL Ratio 4    NonHDL 136.91   TSH  Result Value Ref Range   TSH 1.12 0.35 - 4.50 uIU/mL  VITAMIN D 25 Hydroxy (Vit-D Deficiency, Fractures)  Result Value Ref Range   VITD <7.00 (L) 30.00 - 100.00 ng/mL

## 2020-10-27 NOTE — Patient Instructions (Addendum)
Good to see you again today, I will be in touch with your labs as soon as possible.  Assuming all is well, please see me in about 6 months  You got your first shingles vaccine today- next can be done in 2-6 months The pharmacy on the ground floor is also offering the covid 4th dose Consider getting a mammogram this year I will set you up with dermatology to check the skin area on your behind  Your BP is a bit elevated.  Please check your BP at home and send me some readings in about 2 weeks. BP goal is less than 140/85  I will order x-rays of your back- please come back at your convenience to have these done at the ground floor imaging dept  Health Maintenance, Female Adopting a healthy lifestyle and getting preventive care are important in promoting health and wellness. Ask your health care provider about:  The right schedule for you to have regular tests and exams.  Things you can do on your own to prevent diseases and keep yourself healthy. What should I know about diet, weight, and exercise? Eat a healthy diet  Eat a diet that includes plenty of vegetables, fruits, low-fat dairy products, and lean protein.  Do not eat a lot of foods that are high in solid fats, added sugars, or sodium.   Maintain a healthy weight Body mass index (BMI) is used to identify weight problems. It estimates body fat based on height and weight. Your health care provider can help determine your BMI and help you achieve or maintain a healthy weight. Get regular exercise Get regular exercise. This is one of the most important things you can do for your health. Most adults should:  Exercise for at least 150 minutes each week. The exercise should increase your heart rate and make you sweat (moderate-intensity exercise).  Do strengthening exercises at least twice a week. This is in addition to the moderate-intensity exercise.  Spend less time sitting. Even light physical activity can be beneficial. Watch  cholesterol and blood lipids Have your blood tested for lipids and cholesterol at 61 years of age, then have this test every 5 years. Have your cholesterol levels checked more often if:  Your lipid or cholesterol levels are high.  You are older than 61 years of age.  You are at high risk for heart disease. What should I know about cancer screening? Depending on your health history and family history, you may need to have cancer screening at various ages. This may include screening for:  Breast cancer.  Cervical cancer.  Colorectal cancer.  Skin cancer.  Lung cancer. What should I know about heart disease, diabetes, and high blood pressure? Blood pressure and heart disease  High blood pressure causes heart disease and increases the risk of stroke. This is more likely to develop in people who have high blood pressure readings, are of African descent, or are overweight.  Have your blood pressure checked: ? Every 3-5 years if you are 31-25 years of age. ? Every year if you are 17 years old or older. Diabetes Have regular diabetes screenings. This checks your fasting blood sugar level. Have the screening done:  Once every three years after age 56 if you are at a normal weight and have a low risk for diabetes.  More often and at a younger age if you are overweight or have a high risk for diabetes. What should I know about preventing infection? Hepatitis B If you  have a higher risk for hepatitis B, you should be screened for this virus. Talk with your health care provider to find out if you are at risk for hepatitis B infection. Hepatitis C Testing is recommended for:  Everyone born from [birth year] through 1966.  Anyone with known risk factors for hepatitis C. Sexually transmitted infections (STIs)  Get screened for STIs, including gonorrhea and chlamydia, if: ? You are sexually active and are younger than 61 years of age. ? You are older than 61 years of age and your health care  provider tells you that you are at risk for this type of infection. ? Your sexual activity has changed since you were last screened, and you are at increased risk for chlamydia or gonorrhea. Ask your health care provider if you are at risk.  Ask your health care provider about whether you are at high risk for HIV. Your health care provider may recommend a prescription medicine to help prevent HIV infection. If you choose to take medicine to prevent HIV, you should first get tested for HIV. You should then be tested every 3 months for as long as you are taking the medicine. Pregnancy  If you are about to stop having your period (premenopausal) and you may become pregnant, seek counseling before you get pregnant.  Take 400 to 800 micrograms (mcg) of folic acid every day if you become pregnant.  Ask for birth control (contraception) if you want to prevent pregnancy. Osteoporosis and menopause Osteoporosis is a disease in which the bones lose minerals and strength with aging. This can result in bone fractures. If you are 49 years old or older, or if you are at risk for osteoporosis and fractures, ask your health care provider if you should:  Be screened for bone loss.  Take a calcium or vitamin D supplement to lower your risk of fractures.  Be given hormone replacement therapy (HRT) to treat symptoms of menopause. Follow these instructions at home: Lifestyle  Do not use any products that contain nicotine or tobacco, such as cigarettes, e-cigarettes, and chewing tobacco. If you need help quitting, ask your health care provider.  Do not use street drugs.  Do not share needles.  Ask your health care provider for help if you need support or information about quitting drugs. Alcohol use  Do not drink alcohol if: ? Your health care provider tells you not to drink. ? You are pregnant, may be pregnant, or are planning to become pregnant.  If you drink alcohol: ? Limit how much you use to 0-1  drink a day. ? Limit intake if you are breastfeeding.  Be aware of how much alcohol is in your drink. In the [place] one drink equals one 12 oz bottle of beer (355 mL), one 5 oz glass of wine (148 mL), or one 1 oz glass of hard liquor (44 mL). General instructions  Schedule regular health, dental, and eye exams.  Stay current with your vaccines.  Tell your health care provider if: ? You often feel depressed. ? You have ever been abused or do not feel safe at home. Summary  Adopting a healthy lifestyle and getting preventive care are important in promoting health and wellness.  Follow your health care provider's instructions about healthy diet, exercising, and getting tested or screened for diseases.  Follow your health care provider's instructions on monitoring your cholesterol and blood pressure. This information is not intended to replace advice given to you by your health care provider. Make  sure you discuss any questions you have with your health care provider. Document Revised: 06/17/2018 Document Reviewed: 06/17/2018 Elsevier Patient Education  2021 [employer].

## 2020-10-30 ENCOUNTER — Encounter: Payer: Self-pay | Admitting: Family Medicine

## 2020-10-30 ENCOUNTER — Other Ambulatory Visit: Payer: Self-pay

## 2020-10-30 ENCOUNTER — Ambulatory Visit (INDEPENDENT_AMBULATORY_CARE_PROVIDER_SITE_OTHER): Payer: 59 | Admitting: Family Medicine

## 2020-10-30 ENCOUNTER — Other Ambulatory Visit (HOSPITAL_COMMUNITY)
Admission: RE | Admit: 2020-10-30 | Discharge: 2020-10-30 | Disposition: A | Discharge status: Home / Self Care | Payer: 59 | Source: Ambulatory Visit | Attending: Family Medicine | Admitting: Family Medicine

## 2020-10-30 ENCOUNTER — Other Ambulatory Visit: Payer: Self-pay | Admitting: Family Medicine

## 2020-10-30 VITALS — BP 152/98 | HR 76 | Temp 98.1°F | Resp 15 | Ht 67.0 in | Wt 160.0 lb

## 2020-10-30 DIAGNOSIS — I10 Essential (primary) hypertension: Secondary | ICD-10-CM

## 2020-10-30 DIAGNOSIS — E785 Hyperlipidemia, unspecified: Secondary | ICD-10-CM

## 2020-10-30 DIAGNOSIS — E119 Type 2 diabetes mellitus without complications: Secondary | ICD-10-CM | POA: Diagnosis not present

## 2020-10-30 DIAGNOSIS — E1169 Type 2 diabetes mellitus with other specified complication: Secondary | ICD-10-CM | POA: Diagnosis not present

## 2020-10-30 DIAGNOSIS — E559 Vitamin D deficiency, unspecified: Secondary | ICD-10-CM

## 2020-10-30 DIAGNOSIS — Z1329 Encounter for screening for other suspected endocrine disorder: Secondary | ICD-10-CM | POA: Diagnosis not present

## 2020-10-30 DIAGNOSIS — R5383 Other fatigue: Secondary | ICD-10-CM | POA: Diagnosis not present

## 2020-10-30 DIAGNOSIS — R239 Unspecified skin changes: Secondary | ICD-10-CM

## 2020-10-30 DIAGNOSIS — Z0000 Encounter for general adult medical examination without abnormal findings: Secondary | ICD-10-CM

## 2020-10-30 DIAGNOSIS — M545 Low back pain, unspecified: Secondary | ICD-10-CM

## 2020-10-30 DIAGNOSIS — Z23 Encounter for immunization: Secondary | ICD-10-CM | POA: Diagnosis not present

## 2020-10-30 DIAGNOSIS — Z124 Encounter for screening for malignant neoplasm of cervix: Secondary | ICD-10-CM

## 2020-10-30 DIAGNOSIS — G8929 Other chronic pain: Secondary | ICD-10-CM

## 2020-10-30 LAB — COMPREHENSIVE METABOLIC PANEL
ALT: 12 U/L (ref 0–35)
AST: 18 U/L (ref 0–37)
Albumin: 4.2 g/dL (ref 3.5–5.2)
Alkaline Phosphatase: 93 U/L (ref 39–117)
BUN: 11 mg/dL (ref 6–23)
CO2: 28 mEq/L (ref 19–32)
Calcium: 9.7 mg/dL (ref 8.4–10.5)
Chloride: 106 mEq/L (ref 96–112)
Creatinine, Ser: 1 mg/dL (ref 0.40–1.20)
GFR: 61.08 mL/min (ref >60.00)
Glucose, Bld: 83 mg/dL (ref 70–99)
Potassium: 4.1 mEq/L (ref 3.5–5.1)
Sodium: 141 mEq/L (ref 135–145)
Total Bilirubin: 0.6 mg/dL (ref 0.2–1.2)
Total Protein: 6.8 g/dL (ref 6.0–8.3)

## 2020-10-30 LAB — CBC
HCT: 42.8 % (ref 36.0–46.0)
Hemoglobin: 14.3 g/dL (ref 12.0–15.0)
MCHC: 33.4 g/dL (ref 30.0–36.0)
MCV: 96.2 fl (ref 78.0–100.0)
Platelets: 256 10*3/uL (ref 150.0–400.0)
RBC: 4.45 Mil/uL (ref 3.87–5.11)
RDW: 14 % (ref 11.5–15.5)
WBC: 6.1 10*3/uL (ref 4.0–10.5)

## 2020-10-30 LAB — HEMOGLOBIN A1C: Hgb A1c MFr Bld: 6.4 % (ref 4.6–6.5)

## 2020-10-30 LAB — VITAMIN D 25 HYDROXY (VIT D DEFICIENCY, FRACTURES): VITD: <7 ng/mL — ABNORMAL LOW (ref 30.00–100.00)

## 2020-10-30 LAB — LIPID PANEL
Cholesterol: 191 mg/dL (ref 0–200)
HDL: 53.7 mg/dL (ref >39.00)
LDL Cholesterol: 117 mg/dL — ABNORMAL HIGH (ref 0–99)
NonHDL: 136.91
Total CHOL/HDL Ratio: 4
Triglycerides: 100 mg/dL (ref 0.0–149.0)
VLDL: 20 mg/dL (ref 0.0–40.0)

## 2020-10-30 LAB — TSH: TSH: 1.12 u[IU]/mL (ref 0.35–4.50)

## 2020-10-30 MED ORDER — VITAMIN D3 1.25 MG (50000 UT) PO CAPS: ORAL_CAPSULE | ORAL | 0 refills | Status: DC

## 2020-10-30 NOTE — Addendum Note (Signed)
Addended by: Abbe Amsterdam C on: 10/30/2020 07:01 PM   Modules accepted: Orders

## 2020-11-02 ENCOUNTER — Encounter: Payer: Self-pay | Admitting: Family Medicine

## 2020-11-02 LAB — CYTOLOGY - PAP
Comment: NEGATIVE
Diagnosis: NEGATIVE
High risk HPV: NEGATIVE

## 2020-11-20 ENCOUNTER — Other Ambulatory Visit: Payer: Self-pay | Admitting: Family Medicine

## 2020-11-20 DIAGNOSIS — E7800 Pure hypercholesterolemia, unspecified: Secondary | ICD-10-CM

## 2020-11-20 DIAGNOSIS — I10 Essential (primary) hypertension: Secondary | ICD-10-CM

## 2020-11-24 ENCOUNTER — Other Ambulatory Visit: Payer: Self-pay

## 2020-11-24 ENCOUNTER — Ambulatory Visit (HOSPITAL_BASED_OUTPATIENT_CLINIC_OR_DEPARTMENT_OTHER)
Admission: RE | Admit: 2020-11-24 | Discharge: 2020-11-24 | Disposition: A | Discharge status: Home / Self Care | Payer: 59 | Source: Ambulatory Visit | Attending: Family Medicine | Admitting: Family Medicine

## 2020-11-24 DIAGNOSIS — M545 Low back pain, unspecified: Secondary | ICD-10-CM | POA: Insufficient documentation

## 2020-11-24 DIAGNOSIS — G8929 Other chronic pain: Secondary | ICD-10-CM | POA: Diagnosis present

## 2020-11-26 ENCOUNTER — Encounter: Payer: Self-pay | Admitting: Family Medicine

## 2020-12-26 ENCOUNTER — Encounter: Payer: Self-pay | Admitting: Family Medicine

## 2021-01-02 ENCOUNTER — Encounter: Payer: Self-pay | Admitting: Internal Medicine

## 2021-01-02 ENCOUNTER — Emergency Department (HOSPITAL_BASED_OUTPATIENT_CLINIC_OR_DEPARTMENT_OTHER)
Admission: EM | Admit: 2021-01-02 | Discharge: 2021-01-02 | Disposition: A | Discharge status: Home / Self Care | Payer: 59 | Attending: Emergency Medicine | Admitting: Emergency Medicine

## 2021-01-02 ENCOUNTER — Telehealth (INDEPENDENT_AMBULATORY_CARE_PROVIDER_SITE_OTHER): Payer: 59 | Admitting: Internal Medicine

## 2021-01-02 ENCOUNTER — Other Ambulatory Visit: Payer: Self-pay

## 2021-01-02 ENCOUNTER — Encounter (HOSPITAL_BASED_OUTPATIENT_CLINIC_OR_DEPARTMENT_OTHER): Payer: Self-pay | Admitting: *Deleted

## 2021-01-02 ENCOUNTER — Emergency Department (HOSPITAL_BASED_OUTPATIENT_CLINIC_OR_DEPARTMENT_OTHER): Payer: 59

## 2021-01-02 VITALS — BP 142/93 | HR 92 | Ht 67.0 in | Wt 155.0 lb

## 2021-01-02 DIAGNOSIS — R072 Precordial pain: Secondary | ICD-10-CM | POA: Diagnosis present

## 2021-01-02 DIAGNOSIS — R079 Chest pain, unspecified: Secondary | ICD-10-CM

## 2021-01-02 DIAGNOSIS — Z7982 Long term (current) use of aspirin: Secondary | ICD-10-CM | POA: Diagnosis not present

## 2021-01-02 DIAGNOSIS — F17210 Nicotine dependence, cigarettes, uncomplicated: Secondary | ICD-10-CM | POA: Diagnosis not present

## 2021-01-02 DIAGNOSIS — U071 COVID-19: Secondary | ICD-10-CM | POA: Insufficient documentation

## 2021-01-02 DIAGNOSIS — Z79899 Other long term (current) drug therapy: Secondary | ICD-10-CM | POA: Diagnosis not present

## 2021-01-02 DIAGNOSIS — I10 Essential (primary) hypertension: Secondary | ICD-10-CM | POA: Insufficient documentation

## 2021-01-02 DIAGNOSIS — E1140 Type 2 diabetes mellitus with diabetic neuropathy, unspecified: Secondary | ICD-10-CM | POA: Diagnosis not present

## 2021-01-02 LAB — CBC
HCT: 48.1 % — ABNORMAL HIGH (ref 36.0–46.0)
Hemoglobin: 15.5 g/dL — ABNORMAL HIGH (ref 12.0–15.0)
MCH: 31.5 pg (ref 26.0–34.0)
MCHC: 32.2 g/dL (ref 30.0–36.0)
MCV: 97.8 fL (ref 80.0–100.0)
Platelets: 241 10*3/uL (ref 150–400)
RBC: 4.92 MIL/uL (ref 3.87–5.11)
RDW: 12.8 % (ref 11.5–15.5)
WBC: 8.8 10*3/uL (ref 4.0–10.5)
nRBC: 0 % (ref 0.0–0.2)

## 2021-01-02 LAB — BASIC METABOLIC PANEL
Anion gap: 8 (ref 5–15)
BUN: 18 mg/dL (ref 8–23)
CO2: 26 mmol/L (ref 22–32)
Calcium: 9.7 mg/dL (ref 8.9–10.3)
Chloride: 105 mmol/L (ref 98–111)
Creatinine, Ser: 1.12 mg/dL — ABNORMAL HIGH (ref 0.44–1.00)
GFR, Estimated: 56 mL/min — ABNORMAL LOW (ref >60)
Glucose, Bld: 97 mg/dL (ref 70–99)
Potassium: 4 mmol/L (ref 3.5–5.1)
Sodium: 139 mmol/L (ref 135–145)

## 2021-01-02 LAB — TROPONIN I (HIGH SENSITIVITY)
Troponin I (High Sensitivity): 6 ng/L (ref <18)
Troponin I (High Sensitivity): 7 ng/L (ref <18)

## 2021-01-02 MED ORDER — IBUPROFEN 400 MG PO TABS
600.0000 mg | ORAL_TABLET | Freq: Once | ORAL | Status: AC
  Administered 2021-01-02: 600 mg via ORAL
  Filled 2021-01-02: qty 1

## 2021-01-02 NOTE — Progress Notes (Signed)
Subjective:    Patient ID: Margaret Sims, female    DOB: 14-Nov-1960, 60 y.o.   MRN: [id number]  DOS:  01/02/2021 Type of visit - description: Virtual Visit via Video Note  I connected with the above patient  by a video enabled telemedicine application and verified that I am speaking with the correct person using two identifiers.   THIS ENCOUNTER IS A VIRTUAL VISIT DUE TO COVID-19 - PATIENT WAS NOT SEEN IN THE OFFICE. PATIENT HAS CONSENTED TO VIRTUAL VISIT / TELEMEDICINE VISIT   Location of patient: home  Location of provider: office  Persons participating in the virtual visit: patient, provider   I discussed the limitations of evaluation and management by telemedicine and the availability of in person appointments. The patient expressed understanding and agreed to proceed.  Acute The patient  was feeling profoundly fatigue, she was tested for Covid 12/26/2020 and it came back positive. She is calling today because she continues to be extremely fatigued and also is concerned about chest pain. She reports 2 days of steady chest pain and the center of the anterior chest, described as pressure, some radiation to the neck, worse when she walks or move around. She occasionally has DOE going up stairs. She reports nausea but no vomiting. She has some saliva accumulation in her throat and is a spitting frequently but denies dysphagia or odynophagia. No lower extremity edema.   Review of Systems See above   Past Medical History:  Diagnosis Date   Acid reflux    Diabetes type 2, controlled (HCC) 04/16/2016   High cholesterol    Hypertension     Past Surgical History:  Procedure Laterality Date   CESAREAN SECTION      Allergies as of 01/02/2021   No Known Allergies      Medication List        Accurate as of January 02, 2021  4:01 PM. If you have any questions, ask your nurse or doctor.          aspirin 81 MG tablet Take 81 mg by mouth daily.   atorvastatin 40 MG  tablet Commonly known as: LIPITOR Take 1 tablet (40 mg total) by mouth daily.   lisinopril 40 MG tablet Commonly known as: ZESTRIL Take 1 tablet (40 mg total) by mouth daily.   omeprazole 40 MG capsule Commonly known as: PRILOSEC Take 1 capsule (40 mg total) by mouth daily.   VITAMIN D PO Take by mouth.   Vitamin D3 1.25 MG (50000 UT) Caps Take 1 weekly for 12 weeks           Objective:   Physical Exam BP (!) 142/93 (BP Location: Left Arm)   Pulse 92   Ht 5\' 7"  (1.702 m)   Wt 155 lb (70.3 kg)   BMI 24.28 kg/m  This is a virtual video visit. The patient is alert oriented x3, she looks well, speaking in complete sentences, in no distress    Assessment    60 year old female, PMH includes high cholesterol, mild diabetes, status post 3 COVID vaccines, presents with:  COVID infection, fatigue, chest pain. This is a virtual video visit, she looks well, she is reporting a 2-day history of chest pressure that increased with exertion. In this context and despite the pain not being completely typical for CAD, I think it is important for her to have further evaluation, DDx for chest pain includes PNM, pericarditis, angina, etc. Advised to proceed to the ER, she stated she will.  I discussed the assessment and treatment plan with the patient. The patient was provided an opportunity to ask questions and all were answered. The patient agreed with the plan and demonstrated an understanding of the instructions.   The patient was advised to call back or seek an in-person evaluation if the symptoms worsen or if the condition fails to improve as anticipated.

## 2021-01-02 NOTE — ED Triage Notes (Signed)
Covid positive x 1 week. Chest pain described as heaviness in the center of her chest. EKG at triage.

## 2021-01-02 NOTE — ED Notes (Signed)
Pt marched in place in room HR to 125 and maintained SpO2 of 99% during activity. Pt HR quickly returned to baseline in less than 1 minute of rest. Pt denies any significant dyspnea with activity.

## 2021-01-02 NOTE — ED Provider Notes (Signed)
Care assumed from [facility] at shift change pending delta troponin.  Please see her note for full details, but in brief Margaret Sims is a 60 y.o. female who presents with intermittent chest pains in the setting of COVID infection.  The rest of patient's work-up has overall been reassuring, vitals have been reassuring without hypoxia.  No associated shortness of breath, pain is not pleuritic, low suspicion for PE.  Labs Reviewed  BASIC METABOLIC PANEL - Abnormal; Notable for the following components:      Result Value   Creatinine, Ser 1.12 (*)    GFR, Estimated 56 (*)    All other components within normal limits  CBC - Abnormal; Notable for the following components:   Hemoglobin 15.5 (*)    HCT 48.1 (*)    All other components within normal limits  TROPONIN I (HIGH SENSITIVITY)  TROPONIN I (HIGH SENSITIVITY)   EKG Interpretation  Date/Time:  Tuesday January 02 2021 17:01:57 EDT Ventricular Rate:  105 PR Interval:  146 QRS Duration: 66 QT Interval:  326 QTC Calculation: 430 R Axis:   -33 Text Interpretation: Sinus tachycardia Left axis deviation Minimal voltage criteria for LVH, may be normal variant ( R in aVL ) Cannot rule out Anterior infarct , age undetermined Abnormal ECG When compared to prior, faster rate. NO STEMI Confirmed by Theda Belfast ([id number]) on 01/02/2021 7:34:46 PM  DG Chest 2 View  Result Date: 01/02/2021 CLINICAL DATA:  Chest pain COVID EXAM: CHEST - 2 VIEW COMPARISON:  08/03/2018 FINDINGS: The heart size and mediastinal contours are within normal limits. Aortic atherosclerosis. Both lungs are clear. The visualized skeletal structures are unremarkable. IMPRESSION: No active cardiopulmonary disease. Electronically Signed   By: Jasmine Pang M.D.   On: 01/02/2021 18:01    Delta troponin without elevation.  Patient stable for discharge home with continued supportive care and outpatient follow-up.  Return precautions provided.   Dartha Lodge, PA-C 01/02/21  2131    Tegeler, Canary Brim, MD 01/02/21 2250

## 2021-01-02 NOTE — ED Provider Notes (Signed)
[facility] EMERGENCY DEPARTMENT Provider Note   CSN: [id number] Arrival date & time: 01/02/21  1650     History No chief complaint on file.   Margaret Sims is a 60 y.o. female with Pmhx HTN, high cholesterol, Diabetes, and GERD who presents to the ED today with complaint of gradual onset, intermittent, substernal chest pressure that began 2 days ago. Pt reports on 06/21 she began feeling ill and tested positive for COVID at that time. She has been taking OTC medications with mild relief of her symptoms however began having chest pain 2 days ago. She has not taken anything specifically for her pain. She had a telemedicine visit with her PCP today who advised she come to the ED for further evaluation. Pt is a current everyday smoker however has not been smoking as much over the past 2 days due to the pain. The pain is worsened with exertion and feels better when she rests/lays down. She has some dyspnea on exertion, none at rest. No personal hx of CAD. She does report her father had an MI in his 55's and passed away. No other complaints at this time.   The history is provided by the patient and medical records.      Past Medical History:  Diagnosis Date   Acid reflux    Diabetes type 2, controlled (HCC) 04/16/2016   High cholesterol    Hypertension     Patient Active Problem List   Diagnosis Date Noted   Type 2 diabetes mellitus with diabetic neuropathy, unspecified (HCC) 05/01/2020   Essential hypertension 02/19/2016   Dyslipidemia 02/19/2016   Tobacco use disorder 02/19/2016    Past Surgical History:  Procedure Laterality Date   CESAREAN SECTION       OB History   No obstetric history on file.     No family history on file.  Social History   Tobacco Use   Smoking status: Every Day    Packs/day: 0.50    Pack years: 0.00    Types: Cigarettes   Smokeless tobacco: Never  Vaping Use   Vaping Use: Never used  Substance Use Topics   Alcohol use: Yes     Comment: 1-2 times per week   Drug use: Never    Home Medications Prior to Admission medications   Medication Sig Start Date End Date Taking? Authorizing Provider  aspirin 81 MG tablet Take 81 mg by mouth daily.   Yes [provider]  atorvastatin (LIPITOR) 40 MG tablet Take 1 tablet (40 mg total) by mouth daily. 11/20/20  Yes Copland, [name], MD  Cholecalciferol (VITAMIN D3) 1.25 MG (50000 UT) CAPS Take 1 weekly for 12 weeks 10/30/20  Yes Copland, [name], MD  lisinopril (ZESTRIL) 40 MG tablet Take 1 tablet (40 mg total) by mouth daily. 11/20/20  Yes Copland, [name], MD  omeprazole (PRILOSEC) 40 MG capsule Take 1 capsule (40 mg total) by mouth daily. 04/25/20  Yes Copland, [name], MD  VITAMIN D PO Take by mouth.   Yes [provider]    Allergies    Patient has no known allergies.  Review of Systems   Review of Systems  Constitutional:  Positive for chills and fatigue. Negative for diaphoresis and fever.  Respiratory:  Positive for shortness of breath.   Cardiovascular:  Positive for chest pain. Negative for palpitations and leg swelling.  Gastrointestinal:  Negative for nausea and vomiting.  All other systems reviewed and are negative.  Physical Exam  Updated Vital Signs BP (!) 138/99   Pulse 74   Temp 97.6 F (36.4 C) (Oral)   Resp 16   SpO2 99%   Physical Exam Vitals and nursing note reviewed.  Constitutional:      Appearance: She is not ill-appearing or diaphoretic.  HENT:     Head: Normocephalic and atraumatic.  Eyes:     Conjunctiva/sclera: Conjunctivae normal.  Cardiovascular:     Rate and Rhythm: Normal rate and regular rhythm.     Pulses: Normal pulses.  Pulmonary:     Effort: Pulmonary effort is normal.     Breath sounds: Normal breath sounds. No wheezing, rhonchi or rales.     Comments: +  mild substernal chest wall TTP Chest:     Chest wall: Tenderness present.  Abdominal:     Palpations: Abdomen is soft.     Tenderness:  There is no abdominal tenderness. There is no guarding or rebound.  Musculoskeletal:     Cervical back: Neck supple.     Right lower leg: No edema.     Left lower leg: No edema.  Skin:    General: Skin is warm and dry.  Neurological:     Mental Status: She is alert.    ED Results / Procedures / Treatments   Labs (all labs ordered are listed, but only abnormal results are displayed) Labs Reviewed  BASIC METABOLIC PANEL - Abnormal; Notable for the following components:      Result Value   Creatinine, Ser 1.12 (*)    GFR, Estimated 56 (*)    All other components within normal limits  CBC - Abnormal; Notable for the following components:   Hemoglobin 15.5 (*)    HCT 48.1 (*)    All other components within normal limits  TROPONIN I (HIGH SENSITIVITY)  TROPONIN I (HIGH SENSITIVITY)    EKG EKG Interpretation  Date/Time:  Tuesday January 02 2021 17:01:57 EDT Ventricular Rate:  105 PR Interval:  146 QRS Duration: 66 QT Interval:  326 QTC Calculation: 430 R Axis:   -33 Text Interpretation: Sinus tachycardia Left axis deviation Minimal voltage criteria for LVH, may be normal variant ( R in aVL ) Cannot rule out Anterior infarct , age undetermined Abnormal ECG When compared to prior, faster rate. NO STEMI Confirmed by Theda Belfast ([id number]) on 01/02/2021 7:34:46 PM  Radiology DG Chest 2 View  Result Date: 01/02/2021 CLINICAL DATA:  Chest pain COVID EXAM: CHEST - 2 VIEW COMPARISON:  08/03/2018 FINDINGS: The heart size and mediastinal contours are within normal limits. Aortic atherosclerosis. Both lungs are clear. The visualized skeletal structures are unremarkable. IMPRESSION: No active cardiopulmonary disease. Electronically Signed   By: Jasmine Pang M.D.   On: 01/02/2021 18:01    Procedures Procedures   Medications Ordered in ED Medications  ibuprofen (ADVIL) tablet 600 mg (600 mg Oral Given 01/02/21 1854)    ED Course  I have reviewed the triage vital signs and the nursing  notes.  Pertinent labs & imaging results that were available during my care of the patient were reviewed by me and considered in my medical decision making (see chart for details).    MDM Rules/Calculators/A&P                          59 year old female who presents to the ED today with complaint of intermittent substernal chest pressure/pain for the past 2 days with associated dyspnea on exertion.  Currently has COVID, started  having symptoms on 06/21 and tested positive the same day.  Not currently on antivirals.  Positive family history of CAD and patient is a current every day smoker.  On arrival to the ED patient is afebrile, nontachycardic and nontachypneic and satting 100% on room air.  She appears to be in no acute distress.  An EKG was obtained which per my reading does not appear changed from previous in 2020.  Chest x-ray clear without signs of pneumonia.  Lab work pending at this time including CBC, BMP, troponin.  Lower suspicion for ACS at this time however patient does have risk factors for same, will await troponin.  She has positive substernal chest wall tenderness palpation, suspect likely costochondritis secondary to COVID infection.  Will provide ibuprofen and reevaluate.  Given she is currently in her active COVID infection I have a much lower suspicion for PE at this time.  CBC without leukocytosis.  Hemoglobin slightly elevated 15.5 and hematocrit elevated at 48.1. BMP with creatinine 1.12, previous 1.00.  Not consistent with AKI.  Likely some degree of dehydration.  Have encouraged to increase oral intake at this time.  Patient without any diarrhea or vomiting to suggest fluid depletion. Troponin of 6.   On reevaluation patient resting comfortably, reports improvement with ibuprofen. Heart score of 4.  Difficult to assess given pain has come and gone, given risk factors I do feel a repeat troponin is necessary at this time.  If negative will discharge home.   At shift change  case signed out to Jodi Geralds, PA-C who will dispo patient accordingly.  Patient has been updated and recommends follow-up with PCP in the outpatient setting with ibuprofen as needed for pain.  This note was prepared using Dragon voice recognition software and may include unintentional dictation errors due to the inherent limitations of voice recognition software.  Final Clinical Impression(s) / ED Diagnoses Final diagnoses:  Nonspecific chest pain  COVID-19    Rx / DC Orders ED Discharge Orders     None        Tanda Rockers, PA-C 01/02/21 2027    Tegeler, Canary Brim, MD 01/02/21 2250

## 2021-01-02 NOTE — Discharge Instructions (Signed)
Your workup was overall reassuring today. I would recommend Ibuprofen as needed for chest discomfort and close outpatient follow up with your PCP.   Return to the ED IMMEDIATELY for any new/worsening symptoms including severe chest pain, shortness of breath at rest, passing out, lips/fingers turning blue, coughing up blood, or any other new/concerning symptoms

## 2021-01-16 ENCOUNTER — Other Ambulatory Visit: Payer: Self-pay | Admitting: Family Medicine

## 2021-01-17 NOTE — Telephone Encounter (Signed)
Last vit d checked 10/30/20 and was <7.00.  Do you want to continue?

## 2021-01-18 ENCOUNTER — Encounter: Payer: Self-pay | Admitting: Family Medicine

## 2021-02-20 ENCOUNTER — Other Ambulatory Visit: Payer: Self-pay | Admitting: Family Medicine

## 2021-02-20 DIAGNOSIS — I10 Essential (primary) hypertension: Secondary | ICD-10-CM

## 2021-02-20 DIAGNOSIS — E7800 Pure hypercholesterolemia, unspecified: Secondary | ICD-10-CM

## 2021-03-14 ENCOUNTER — Telehealth: Payer: Self-pay

## 2021-03-14 ENCOUNTER — Encounter: Payer: Self-pay | Admitting: Family Medicine

## 2021-03-14 NOTE — Telephone Encounter (Signed)
Pt would like medication sent in for coughing, chills, head congestion. This problem started yesterday and is covid neg.

## 2021-04-30 NOTE — Patient Instructions (Addendum)
It was good to see you today, I will be in touch with your lab results.  Assuming all is well please see me in about 6 months  If your side pain is worsening or if any other symptoms let me know!  I will check your white cell count today- IF elevated we should consider a CT scan to make sure there is now abdominal process.  Otherwise- assuming getting better- I think the issue is with your oblique muscles  Flu shot today Please stop by and get your covid vaccine downstairs

## 2021-04-30 NOTE — Progress Notes (Signed)
[facility] [address], [address] [place], [place] [address] [phone]  Date:  05/02/2021   Name:  Margaret Sims   DOB:  1960-09-24   MRN:  [id number]  PCP:  Pearline Cables, MD    Chief Complaint: 6 month follow up (Concerns/ questions: R side pain x Sunday/Flu shot today: yes/)   History of Present Illness:  Margaret Sims is a 60 y.o. very pleasant female patient who presents with the following:  Patient seen today for periodic follow-up visit Most recently seen by myself in April-at that time she was having some issues with lower back pain and had restarted smoking about half a pack per day  History of Diet-controlled diabetes with neuropathy, hypertension, dyslipidemia, smoking  She was seen in the ER in June with chest pain-she was evaluated and released to home She did have covid right around that time as well She noted a lot of brain fog around the time of her covid infection- she is much improved now   In September she was not feeling well but tested negative for covid at that time  She is still smoking about 1/2 ppd Over her life she has smoked about 40 years.  Always about 1/2 PPD   She has noted a sharp pain in her right lower abdomen for about 3 days.  No vomiting.  She did have some nausea Eating normally No fever No urinary sx   Pneumonia vaccine-can give Prevnar 20 COVID booster; she plans to do this today  Can get second dose of Shingrix Flu vaccine- give today  Mammo done May 2021 Can offer DEXA scan  Aspirin 81 Meloxicam Lisinopril Prozac  Lab Results  Component Value Date   HGBA1C 6.4 05/02/2021    Patient Active Problem List   Diagnosis Date Noted   Type 2 diabetes mellitus with diabetic neuropathy, unspecified (HCC) 05/01/2020   Essential hypertension 02/19/2016   Dyslipidemia 02/19/2016   Tobacco use disorder 02/19/2016    Past Medical History:  Diagnosis Date   Acid  reflux    Diabetes type 2, controlled (HCC) 04/16/2016   High cholesterol    Hypertension     Past Surgical History:  Procedure Laterality Date   CESAREAN SECTION      Social History   Tobacco Use   Smoking status: Every Day    Packs/day: 0.50    Types: Cigarettes   Smokeless tobacco: Never  Vaping Use   Vaping Use: Never used  Substance Use Topics   Alcohol use: Yes    Comment: 1-2 times per week   Drug use: Never    No family history on file.  No Known Allergies  Medication list has been reviewed and updated.  Current Outpatient Medications on File Prior to Visit  Medication Sig Dispense Refill   aspirin 81 MG tablet Take 81 mg by mouth daily.     atorvastatin (LIPITOR) 40 MG tablet TAKE ONE (1) TABLET BY MOUTH EACH DAY 90 tablet 1   lisinopril (ZESTRIL) 40 MG tablet TAKE ONE (1) TABLET BY MOUTH EACH DAY 90 tablet 1   omeprazole (PRILOSEC) 40 MG capsule TAKE 1 CAPSULE BY MOUTH DAILY 90 capsule 3   VITAMIN D PO Take by mouth.     No current facility-administered medications on file prior to visit.    Review of Systems:  As per HPI- otherwise negative.   Physical Examination: Vitals:   05/02/21 9702  BP: 138/80  Pulse: 81  Resp: 18  Temp: 97.8 F (36.6 C)  SpO2: 100%   Vitals:   05/02/21 0948  Weight: 154 lb 12.8 oz (70.2 kg)  Height: 5\' 7"  (1.702 m)   Body mass index is 24.25 kg/m. Ideal Body Weight: Weight in (lb) to have BMI = 25: 159.3  GEN: no acute distress.  Normal weight, looks well HEENT: Atraumatic, Normocephalic.  Bilateral TM wnl, oropharynx normal.  PEERL,EOMI.   Ears and Nose: No external deformity. CV: RRR, No M/G/R. No JVD. No thrill. No extra heart sounds. PULM: CTA B, no wheezes, crackles, rhonchi. No retractions. No resp. distress. No accessory muscle use. ABD: S, NT, ND, +BS. No rebound. No HSM.  No particular abdominal or pelvic tenderness noted at this time.  She is slightly tender more over the lateral trunk, seems  consistent with tender oblique abdominals EXTR: No c/c/e PSYCH: Normally interactive. Conversant.    Assessment and Plan: Essential hypertension - Plan: Comprehensive metabolic panel  Hyperlipidemia associated with type 2 diabetes mellitus (HCC)  Controlled type 2 diabetes mellitus without complication, without long-term current use of insulin (HCC) - Plan: Hemoglobin A1c  Vitamin D deficiency - Plan: VITAMIN D 25 Hydroxy (Vit-D Deficiency, Fractures)  Screening mammogram for breast cancer - Plan: MM 3D SCREEN BREAST BILATERAL  Estrogen deficiency - Plan: DG Bone Density  Screening for lung cancer - Plan: CT CHEST LUNG CA SCREEN LOW DOSE W/O CM  Right sided abdominal pain - Plan: CBC  Need for influenza vaccination - Plan: Flu Vaccine QUAD 6+ mos PF IM (Fluarix Quad PF)  Patient seen today for follow-up Will plan further follow- up pending labs. She also mentions some trunk/abdominal discomfort as above.  On exam it seems that she has tenderness of her oblique abdominal muscles.  We will check a white cell count, if elevated consider CT scan.  Otherwise she will contact me if this is getting worse or not improving  Signed , MD  Received her labs as below, message to patient Results for orders placed or performed in visit on 05/02/21  Comprehensive metabolic panel  Result Value Ref Range   Sodium 143 135 - 145 mEq/L   Potassium 3.9 3.5 - 5.1 mEq/L   Chloride 107 96 - 112 mEq/L   CO2 29 19 - 32 mEq/L   Glucose, Bld 91 70 - 99 mg/dL   BUN 10 6 - 23 mg/dL   Creatinine, Ser 05/04/21 0.40 - 1.20 mg/dL   Total Bilirubin 0.5 0.2 - 1.2 mg/dL   Alkaline Phosphatase 80 39 - 117 U/L   AST 18 0 - 37 U/L   ALT 11 0 - 35 U/L   Total Protein 6.5 6.0 - 8.3 g/dL   Albumin 4.2 3.5 - 5.2 g/dL   GFR 1.47 (L) 82.95 mL/min   Calcium 9.5 8.4 - 10.5 mg/dL  Hemoglobin >62.13  Result Value Ref Range   Hgb A1c MFr Bld 6.4 4.6 - 6.5 %  VITAMIN D 25 Hydroxy (Vit-D Deficiency,  Fractures)  Result Value Ref Range   VITD 28.12 (L) 30.00 - 100.00 ng/mL  CBC  Result Value Ref Range   WBC 6.7 4.0 - 10.5 K/uL   RBC 4.01 3.87 - 5.11 Mil/uL   Platelets 227.0 150.0 - 400.0 K/uL   Hemoglobin 13.1 12.0 - 15.0 g/dL   HCT Y8M 57.8 - 46.9 %   MCV 97.5 78.0 - 100.0 fl   MCHC 33.5 30.0 - 36.0 g/dL  RDW 13.9 11.5 - 15.5 %

## 2021-05-02 ENCOUNTER — Ambulatory Visit (INDEPENDENT_AMBULATORY_CARE_PROVIDER_SITE_OTHER): Payer: 59 | Admitting: Family Medicine

## 2021-05-02 ENCOUNTER — Encounter: Payer: Self-pay | Admitting: Family Medicine

## 2021-05-02 ENCOUNTER — Other Ambulatory Visit: Payer: Self-pay

## 2021-05-02 ENCOUNTER — Ambulatory Visit: Payer: 59 | Attending: Internal Medicine

## 2021-05-02 VITALS — BP 138/80 | HR 81 | Temp 97.8°F | Resp 18 | Ht 67.0 in | Wt 154.8 lb

## 2021-05-02 DIAGNOSIS — I10 Essential (primary) hypertension: Secondary | ICD-10-CM

## 2021-05-02 DIAGNOSIS — Z23 Encounter for immunization: Secondary | ICD-10-CM

## 2021-05-02 DIAGNOSIS — E559 Vitamin D deficiency, unspecified: Secondary | ICD-10-CM

## 2021-05-02 DIAGNOSIS — E785 Hyperlipidemia, unspecified: Secondary | ICD-10-CM

## 2021-05-02 DIAGNOSIS — E119 Type 2 diabetes mellitus without complications: Secondary | ICD-10-CM | POA: Diagnosis not present

## 2021-05-02 DIAGNOSIS — R109 Unspecified abdominal pain: Secondary | ICD-10-CM | POA: Diagnosis not present

## 2021-05-02 DIAGNOSIS — E1169 Type 2 diabetes mellitus with other specified complication: Secondary | ICD-10-CM

## 2021-05-02 DIAGNOSIS — E2839 Other primary ovarian failure: Secondary | ICD-10-CM

## 2021-05-02 DIAGNOSIS — Z122 Encounter for screening for malignant neoplasm of respiratory organs: Secondary | ICD-10-CM

## 2021-05-02 DIAGNOSIS — Z1231 Encounter for screening mammogram for malignant neoplasm of breast: Secondary | ICD-10-CM

## 2021-05-02 LAB — CBC
HCT: 39.1 % (ref 36.0–46.0)
Hemoglobin: 13.1 g/dL (ref 12.0–15.0)
MCHC: 33.5 g/dL (ref 30.0–36.0)
MCV: 97.5 fl (ref 78.0–100.0)
Platelets: 227 10*3/uL (ref 150.0–400.0)
RBC: 4.01 Mil/uL (ref 3.87–5.11)
RDW: 13.9 % (ref 11.5–15.5)
WBC: 6.7 10*3/uL (ref 4.0–10.5)

## 2021-05-02 LAB — VITAMIN D 25 HYDROXY (VIT D DEFICIENCY, FRACTURES): VITD: 28.12 ng/mL — ABNORMAL LOW (ref 30.00–100.00)

## 2021-05-02 LAB — COMPREHENSIVE METABOLIC PANEL
ALT: 11 U/L (ref 0–35)
AST: 18 U/L (ref 0–37)
Albumin: 4.2 g/dL (ref 3.5–5.2)
Alkaline Phosphatase: 80 U/L (ref 39–117)
BUN: 10 mg/dL (ref 6–23)
CO2: 29 mEq/L (ref 19–32)
Calcium: 9.5 mg/dL (ref 8.4–10.5)
Chloride: 107 mEq/L (ref 96–112)
Creatinine, Ser: 1.02 mg/dL (ref 0.40–1.20)
GFR: 59.43 mL/min — ABNORMAL LOW (ref >60.00)
Glucose, Bld: 91 mg/dL (ref 70–99)
Potassium: 3.9 mEq/L (ref 3.5–5.1)
Sodium: 143 mEq/L (ref 135–145)
Total Bilirubin: 0.5 mg/dL (ref 0.2–1.2)
Total Protein: 6.5 g/dL (ref 6.0–8.3)

## 2021-05-02 LAB — HEMOGLOBIN A1C: Hgb A1c MFr Bld: 6.4 % (ref 4.6–6.5)

## 2021-05-02 NOTE — Progress Notes (Signed)
   Covid-19 Vaccination Clinic  Name:  ADIYAH LAME    MRN: [id number] DOB: 1961-01-25  05/02/2021  Ms. Achorn was observed post Covid-19 immunization for 15 minutes without incident. She was provided with Vaccine Information Sheet and instruction to access the V-Safe system.   Ms. Guerrera was instructed to call 911 with any severe reactions post vaccine: Difficulty breathing  Swelling of face and throat  A fast heartbeat  A bad rash all over body  Dizziness and weakness   Immunizations Administered     Name Date Dose VIS Date Route   Pfizer Covid-19 Vaccine Bivalent Booster 05/02/2021 10:43 AM 0.3 mL 03/07/2021 Intramuscular   Manufacturer: [employer], [employer]   Lot: [id number]   NDC: [phone]

## 2021-05-24 ENCOUNTER — Other Ambulatory Visit (HOSPITAL_BASED_OUTPATIENT_CLINIC_OR_DEPARTMENT_OTHER): Payer: Self-pay | Admitting: Family Medicine

## 2021-05-24 DIAGNOSIS — E2839 Other primary ovarian failure: Secondary | ICD-10-CM

## 2021-05-25 ENCOUNTER — Other Ambulatory Visit (HOSPITAL_BASED_OUTPATIENT_CLINIC_OR_DEPARTMENT_OTHER): Payer: Self-pay

## 2021-05-25 MED ORDER — PFIZER COVID-19 VAC BIVALENT 30 MCG/0.3ML IM SUSP
INTRAMUSCULAR | 0 refills | Status: DC
  Filled 2021-05-25: qty 0.3, 1d supply, fill #0

## 2021-06-11 ENCOUNTER — Encounter (HOSPITAL_BASED_OUTPATIENT_CLINIC_OR_DEPARTMENT_OTHER): Payer: Self-pay

## 2021-06-11 ENCOUNTER — Other Ambulatory Visit: Payer: Self-pay | Admitting: Family Medicine

## 2021-06-11 ENCOUNTER — Ambulatory Visit (HOSPITAL_BASED_OUTPATIENT_CLINIC_OR_DEPARTMENT_OTHER)
Admission: RE | Admit: 2021-06-11 | Discharge: 2021-06-11 | Disposition: A | Discharge status: Home / Self Care | Payer: 59 | Source: Ambulatory Visit | Attending: Family Medicine | Admitting: Family Medicine

## 2021-06-11 ENCOUNTER — Other Ambulatory Visit: Payer: Self-pay

## 2021-06-11 DIAGNOSIS — Z122 Encounter for screening for malignant neoplasm of respiratory organs: Secondary | ICD-10-CM | POA: Insufficient documentation

## 2021-06-11 DIAGNOSIS — E2839 Other primary ovarian failure: Secondary | ICD-10-CM | POA: Insufficient documentation

## 2021-06-11 DIAGNOSIS — Z1231 Encounter for screening mammogram for malignant neoplasm of breast: Secondary | ICD-10-CM

## 2021-06-12 ENCOUNTER — Encounter: Payer: Self-pay | Admitting: Family Medicine

## 2021-07-21 IMAGING — DX DG CHEST 2V
2 series · 2 of 2 positions shown · non-contrast
Comparison: 08/03/2018

CLINICAL DATA: Chest pain COVID

EXAM:
CHEST - 2 VIEW

[chest pa]
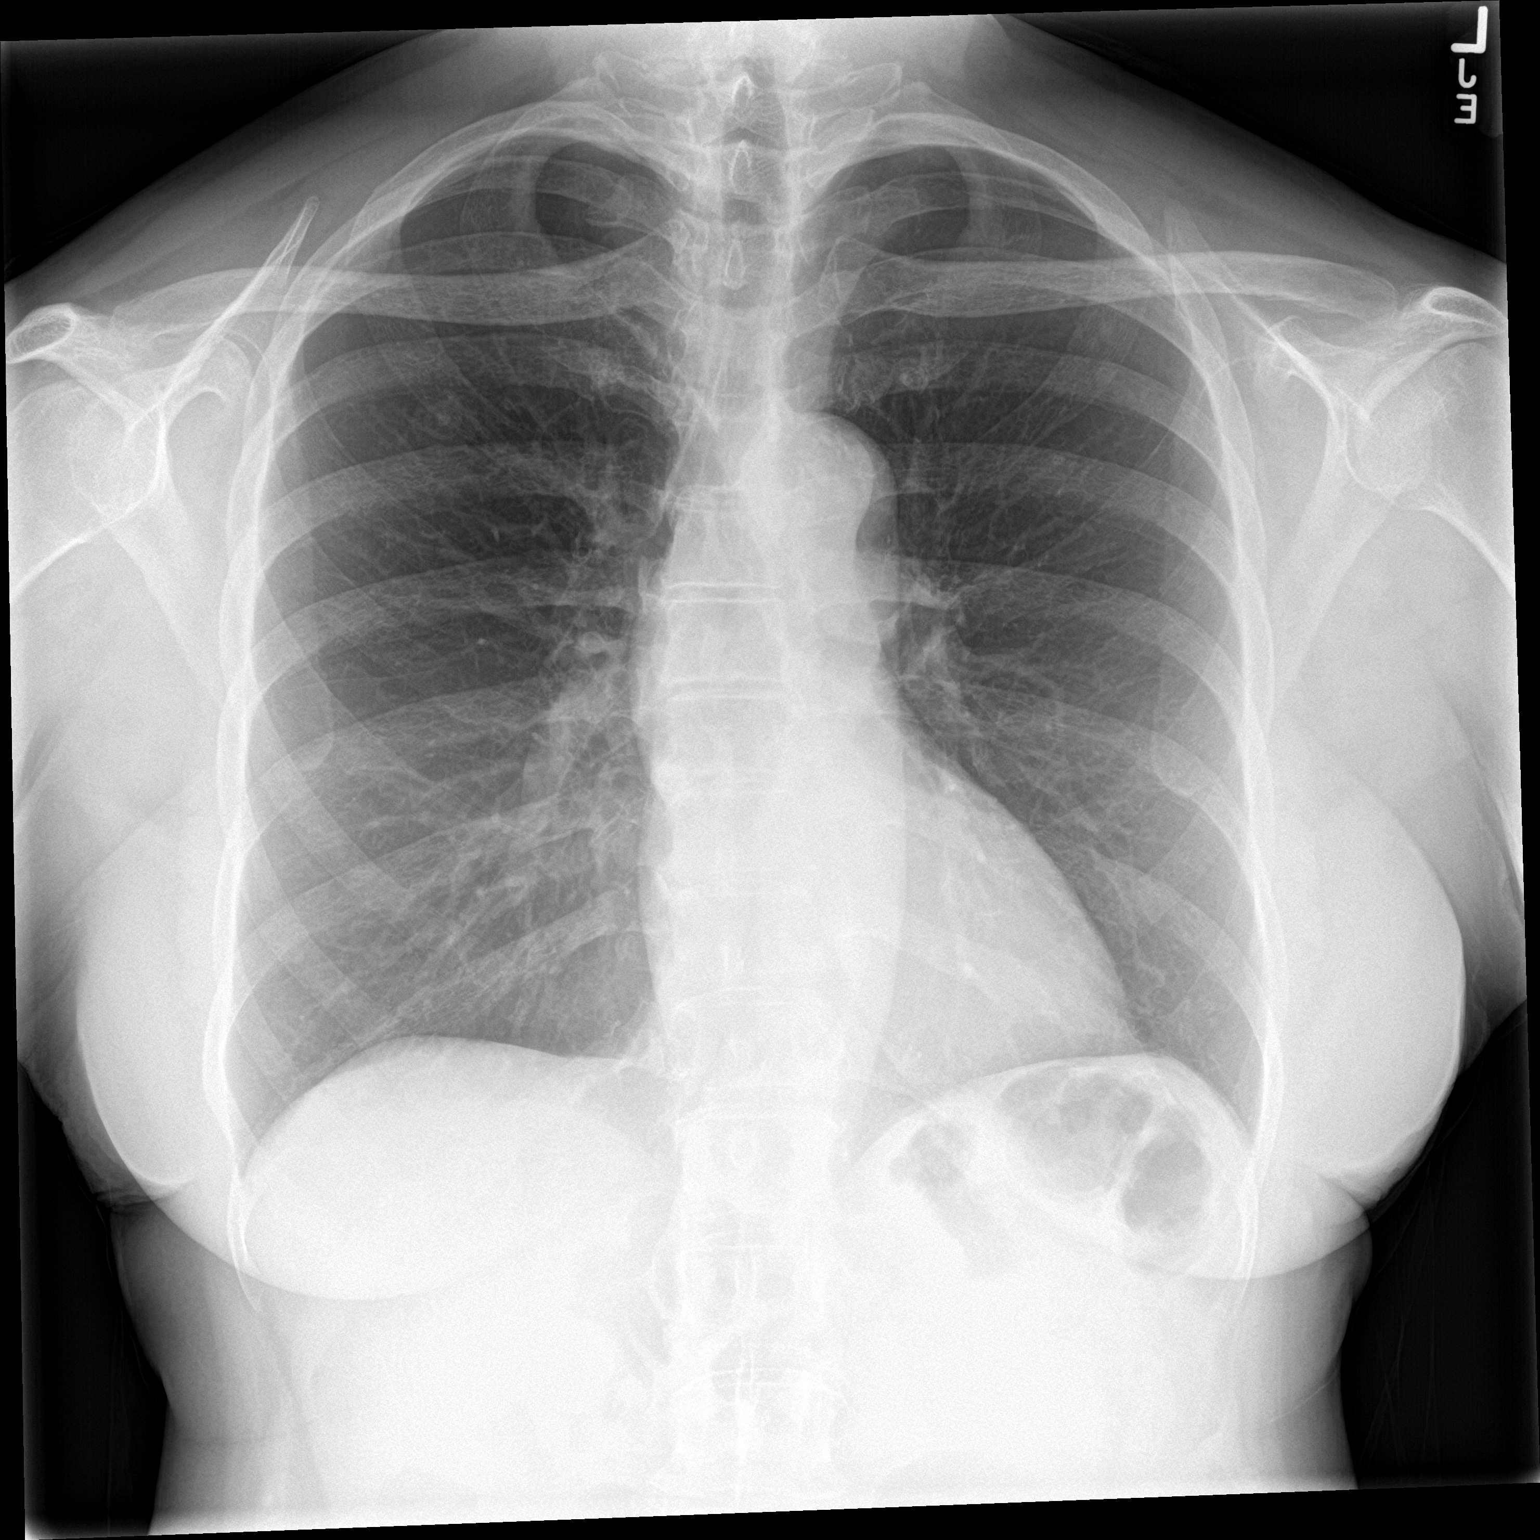

[chest lat]
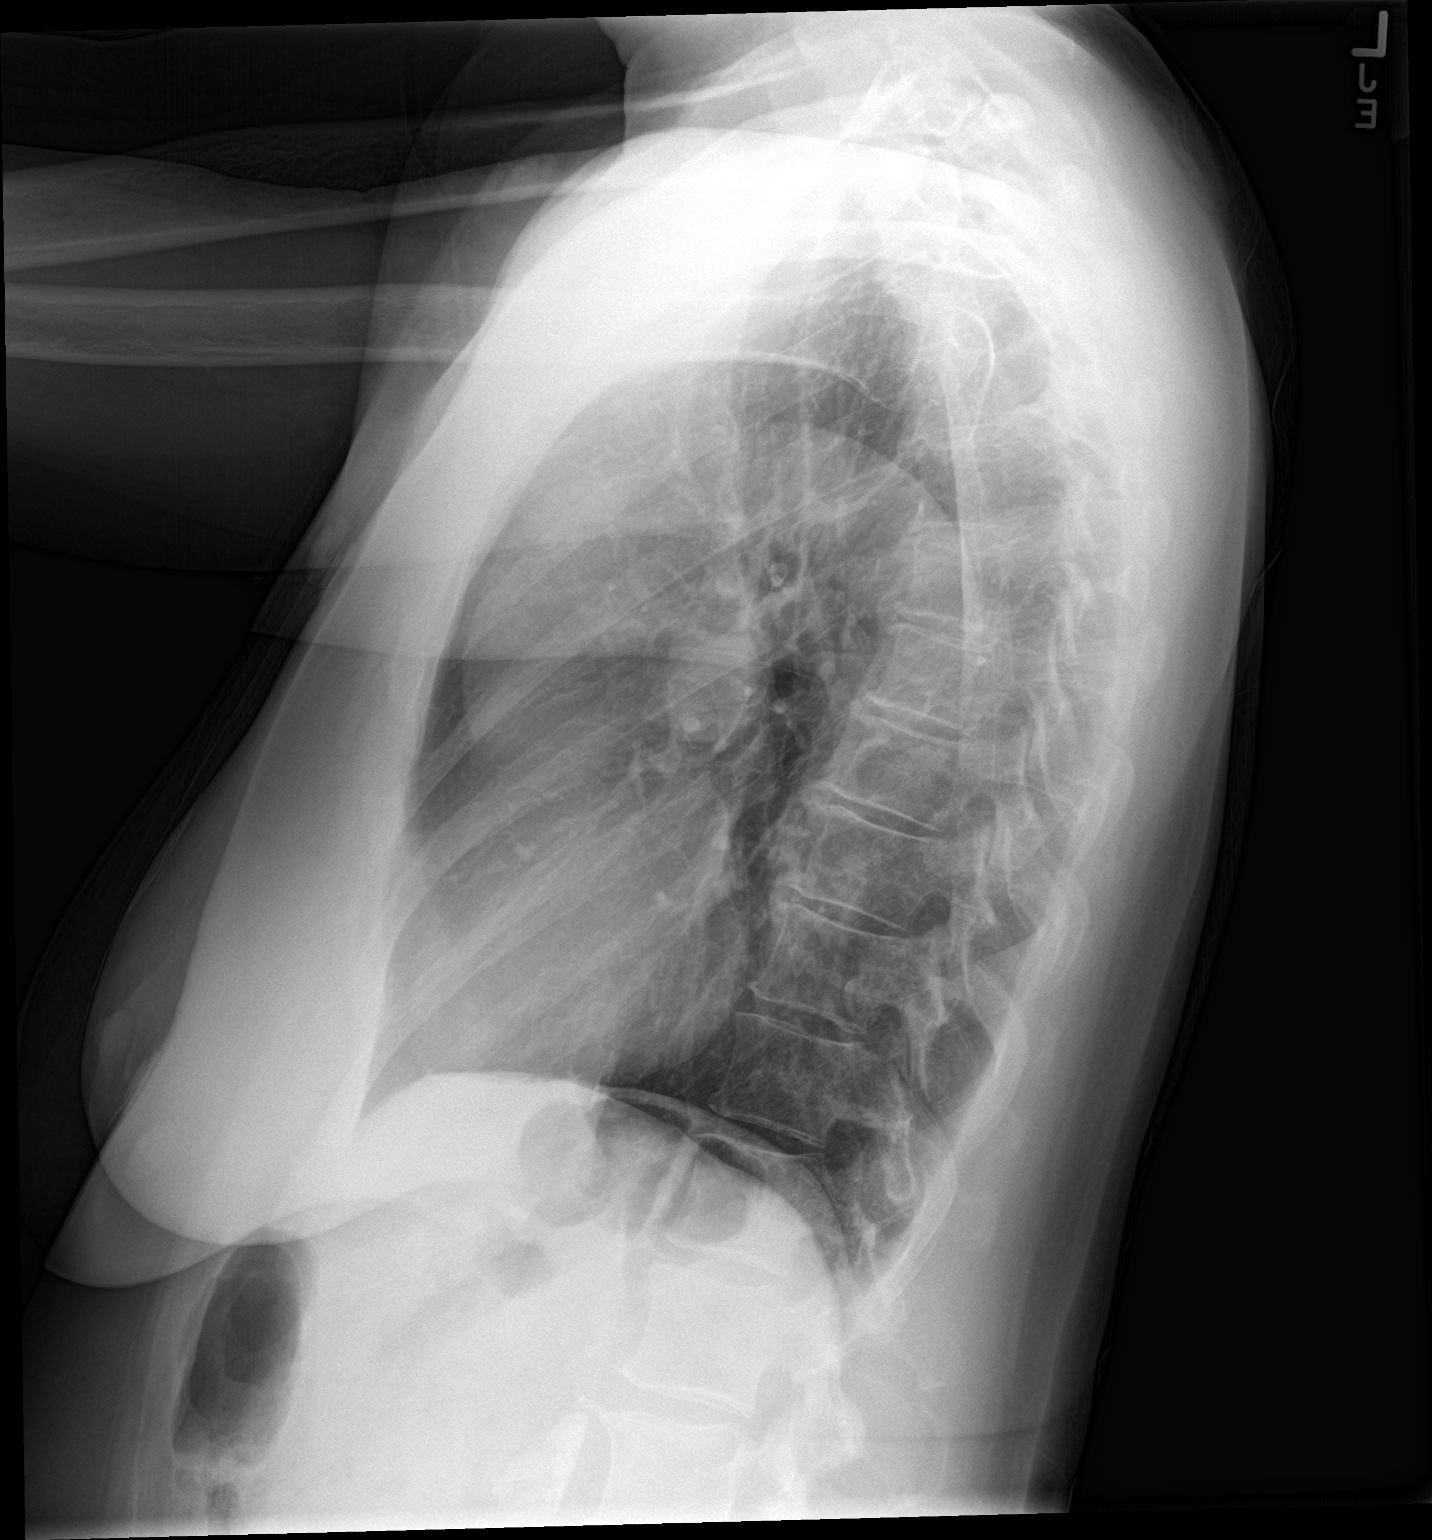

[2 of 2 positions shown; findings below may reference images not displayed]

FINDINGS: The heart size and mediastinal contours are within normal limits.
Aortic atherosclerosis. Both lungs are clear. The visualized
skeletal structures are unremarkable.
IMPRESSION: No active cardiopulmonary disease.

## 2021-10-26 NOTE — Patient Instructions (Addendum)
Good to see you again today- please see me in about 6 months assuming all is well ? ?Your ortho is Dr Case at [facility]- please give them a call regarding your shoulder and right inner thigh pain ? [facility] - [address]   ?[phone]   ?[address]   ?[place], [place] [address]   ?[phone]    ?2nd/ last dose shingrix today ? ?I will be in touch with your labs ?Try trazodone for sleep- start with a 1/2 tablet and increase to a whole tablet if needed.  If not helping please alert me!   ? ?Congrats on quitting smoking!!!!!!!    ?

## 2021-10-26 NOTE — Progress Notes (Addendum)
[facility] ?[address], [address] ?[place], [place] [address] ?336 [phone] ?Fax [phone] ? ?Date:  10/29/2021  ? ?Name:  Margaret Sims   DOB:  1961-03-22   MRN:  [id number] ? ?PCP:  Pearline Cables, MD  ? ? ?Chief Complaint: 6 month follow up (Concerns/ questions: 1. Pt says the pain in her arms have returned. 2. Insomnia since she has stopped smoking. /) ? ? ?History of Present Illness: ? ?Margaret Sims is a 61 y.o. very pleasant female patient who presents with the following: ? ?Here today for a 6 month follow-up ?Last seen by myself in October  ?History of Diet-controlled diabetes with neuropathy, hypertension, dyslipidemia, smoking ? ?Pt stopped smoking about 5 weeks ago!  She hopes she will be able to stay quit.  Still has struggled with not sleeping since she quit -she may wake up with some unusual dreams ?She plans to retire in late May ?She is feeing so tired when she goes to work!  ?Her husband notes some snoring- they wonder if she might have sleep apena ?She is able to get to sleep find but wakes up with strange dreams   ? ?She has previous history of rotator cuff tendinitis, she feels as though this may be coming back.  It is not as severe as it was previously, but she does note some stiffness and soreness in both shoulders ?She also notes a tender area on her right inner thigh for several weeks or even months ?  ?Shingrix- will give 2nd dose today  ?Eye exam- she will update soon ?Foot exam due ?CT lung cancer screen UTD  ?Colon next year ?Mammogram UTD ?Pap UTD  ?Labs done in October- minimally low GFR ?Can check lipids, A1c ? ?Wt Readings from Last 3 Encounters:  ?10/29/21 157 lb 3.2 oz (71.3 kg)  ?05/02/21 154 lb 12.8 oz (70.2 kg)  ?01/02/21 155 lb (70.3 kg)  ? ? ?Lab Results  ?Component Value Date  ? HGBA1C 6.4 05/02/2021  ? ? ?Patient Active Problem List  ? Diagnosis Date Noted  ? Type 2 diabetes mellitus with diabetic neuropathy, unspecified (HCC)  05/01/2020  ? Essential hypertension 02/19/2016  ? Dyslipidemia 02/19/2016  ? Tobacco use disorder 02/19/2016  ? ? ?Past Medical History:  ?Diagnosis Date  ? Acid reflux   ? Diabetes type 2, controlled (HCC) 04/16/2016  ? High cholesterol   ? Hypertension   ? ? ?Past Surgical History:  ?Procedure Laterality Date  ? CESAREAN SECTION    ? ? ?Social History  ? ?Tobacco Use  ? Smoking status: Every Day  ?  Packs/day: 0.50  ?  Types: Cigarettes  ? Smokeless tobacco: Never  ?Vaping Use  ? Vaping Use: Never used  ?Substance Use Topics  ? Alcohol use: Yes  ?  Comment: 1-2 times per week  ? Drug use: Never  ? ? ?No family history on file. ? ?No Known Allergies ? ?Medication list has been reviewed and updated. ? ?Current Outpatient Medications on File Prior to Visit  ?Medication Sig Dispense Refill  ? aspirin 81 MG tablet Take 81 mg by mouth daily.    ? atorvastatin (LIPITOR) 40 MG tablet TAKE ONE (1) TABLET BY MOUTH EACH DAY 90 tablet 1  ? lisinopril (ZESTRIL) 40 MG tablet TAKE ONE (1) TABLET BY MOUTH EACH DAY 90 tablet 1  ? omeprazole (PRILOSEC) 40 MG capsule TAKE 1 CAPSULE BY MOUTH DAILY 90 capsule 3  ?  VITAMIN D PO Take by mouth.    ? ?No current facility-administered medications on file prior to visit.  ? ? ?Review of Systems: ? ?As per HPI- otherwise negative. ? ? ?Physical Examination: ?Vitals:  ? 10/29/21 1410  ?BP: 122/70  ?Pulse: 78  ?Resp: 18  ?Temp: 98.3 ?F (36.8 ?C)  ?SpO2: 99%  ? ?Vitals:  ? 10/29/21 1410  ?Weight: 157 lb 3.2 oz (71.3 kg)  ?Height: 5\' 7"  (1.702 m)  ? ?Body mass index is 24.62 kg/m?. ?Ideal Body Weight: Weight in (lb) to have BMI = 25: 159.3 ? ?GEN: no acute distress. Normal weight, looks well  ?HEENT: Atraumatic, Normocephalic.   Bilateral TM wnl, oropharynx normal.  PEERL,EOMI.   ?Ears and Nose: No external deformity. ?CV: RRR, No M/G/R. No JVD. No thrill. No extra heart sounds. ?PULM: CTA B, no wheezes, crackles, rhonchi. No retractions. No resp. distress. No accessory muscle use. ?ABD: S, NT,  ND. No rebound. No HSM. ?EXTR: No c/c/e ?PSYCH: Normally interactive. Conversant.  ?Full range of motion and largely normal strength of both upper extremities, though she has minimal weakness with empty can testing bilaterally ?She has some discomfort with external and internal rotation bilaterally ?She notes a tender area in the musculature of her right inner thigh ? ?Assessment and Plan: ?Essential hypertension - Plan: Basic metabolic panel ? ?Hyperlipidemia associated with type 2 diabetes mellitus (HCC) - Plan: Lipid panel ? ?Controlled type 2 diabetes mellitus without complication, without long-term current use of insulin (HCC) - Plan: Hemoglobin A1c ? ?Immunization due - Plan: Varicella-zoster vaccine IM (Shingrix) ? ?Primary insomnia - Plan: traZODone (DESYREL) 50 MG tablet ? ?Following up today.  Patient has quit smoking, congratulated her ?More difficulty sleeping since she quit smoking.  We will try trazodone.  She will let me know how this works for her.  We might also consider testing for sleep apnea if sleep difficulty continues ? ?Follow-up on diabetes, check A1c ? ?Blood pressure under good control-continue lisinopril ? ?Second dose Shingrix ? ?She plans to follow-up with her orthopedist regarding her shoulders, she will also ask this provider about her inner thigh pain.  I have asked her to let me know if I can help further with this issue ? ?Signed ? , MD ?Addendum 4/25 ?Received labs as below, message to patient ?Results for orders placed or performed in visit on 10/29/21  ?Basic metabolic panel  ?Result Value Ref Range  ? Sodium 142 135 - 145 mEq/L  ? Potassium 4.0 3.5 - 5.1 mEq/L  ? Chloride 105 96 - 112 mEq/L  ? CO2 30 19 - 32 mEq/L  ? Glucose, Bld 87 70 - 99 mg/dL  ? BUN 13 6 - 23 mg/dL  ? Creatinine, Ser 0.93 0.40 - 1.20 mg/dL  ? GFR 66.17 >60.00 mL/min  ? Calcium 9.4 8.4 - 10.5 mg/dL  ?Hemoglobin A1c  ?Result Value Ref Range  ? Hgb A1c MFr Bld 6.6 (H) 4.6 - 6.5 %  ?Lipid panel   ?Result Value Ref Range  ? Cholesterol 185 0 - 200 mg/dL  ? Triglycerides 99.0 0.0 - 149.0 mg/dL  ? HDL 58.50 >39.00 mg/dL  ? VLDL 19.8 0.0 - 40.0 mg/dL  ? LDL Cholesterol 107 (H) 0 - 99 mg/dL  ? Total CHOL/HDL Ratio 3   ? NonHDL 126.48   ? ? ? ?

## 2021-10-29 ENCOUNTER — Ambulatory Visit (INDEPENDENT_AMBULATORY_CARE_PROVIDER_SITE_OTHER): Payer: 59 | Admitting: Family Medicine

## 2021-10-29 VITALS — BP 122/70 | HR 78 | Temp 98.3°F | Resp 18 | Ht 67.0 in | Wt 157.2 lb

## 2021-10-29 DIAGNOSIS — I10 Essential (primary) hypertension: Secondary | ICD-10-CM | POA: Diagnosis not present

## 2021-10-29 DIAGNOSIS — Z23 Encounter for immunization: Secondary | ICD-10-CM | POA: Diagnosis not present

## 2021-10-29 DIAGNOSIS — E119 Type 2 diabetes mellitus without complications: Secondary | ICD-10-CM

## 2021-10-29 DIAGNOSIS — F5101 Primary insomnia: Secondary | ICD-10-CM

## 2021-10-29 DIAGNOSIS — E1169 Type 2 diabetes mellitus with other specified complication: Secondary | ICD-10-CM | POA: Diagnosis not present

## 2021-10-29 DIAGNOSIS — E785 Hyperlipidemia, unspecified: Secondary | ICD-10-CM | POA: Diagnosis not present

## 2021-10-29 MED ORDER — TRAZODONE HCL 50 MG PO TABS: 25.0000 mg | ORAL_TABLET | Freq: Every evening | ORAL | 3 refills | Status: DC | PRN

## 2021-10-30 ENCOUNTER — Encounter: Payer: Self-pay | Admitting: Family Medicine

## 2021-10-30 LAB — LIPID PANEL
Cholesterol: 185 mg/dL (ref 0–200)
HDL: 58.5 mg/dL (ref >39.00)
LDL Cholesterol: 107 mg/dL — ABNORMAL HIGH (ref 0–99)
NonHDL: 126.48
Total CHOL/HDL Ratio: 3
Triglycerides: 99 mg/dL (ref 0.0–149.0)
VLDL: 19.8 mg/dL (ref 0.0–40.0)

## 2021-10-30 LAB — BASIC METABOLIC PANEL
BUN: 13 mg/dL (ref 6–23)
CO2: 30 mEq/L (ref 19–32)
Calcium: 9.4 mg/dL (ref 8.4–10.5)
Chloride: 105 mEq/L (ref 96–112)
Creatinine, Ser: 0.93 mg/dL (ref 0.40–1.20)
GFR: 66.17 mL/min (ref >60.00)
Glucose, Bld: 87 mg/dL (ref 70–99)
Potassium: 4 mEq/L (ref 3.5–5.1)
Sodium: 142 mEq/L (ref 135–145)

## 2021-10-30 LAB — HEMOGLOBIN A1C: Hgb A1c MFr Bld: 6.6 % — ABNORMAL HIGH (ref 4.6–6.5)

## 2021-10-31 ENCOUNTER — Ambulatory Visit: Payer: 59 | Admitting: Family Medicine

## 2021-12-18 ENCOUNTER — Encounter: Payer: Self-pay | Admitting: Family Medicine

## 2021-12-19 NOTE — Telephone Encounter (Signed)
Called her back- she noted this past Sunday (today is Tuesday) she fainted twice at dinner.  Her family took her to [facility] where she was admitted overnight  She now notes a feeling of something crawling on her right hand/ arm for about a month. She also notes some tingling in her right thumb and index finger for the last day or so No weakness of her hand or arm.  No slurred speech, no facial drooping or other particular neurologic symptoms, except she notes that she feels "sleepy" She also has complaint of a fluttering in her chest. I advised patient to be symptoms run the gamut from potentially benign to very serious.  Certainly would be reasonable for her to return to the ER now for these new symptoms.  She declines to do this, asked if we can see her in the office tomorrow.  I advised her tI am already 100% blocked, but we will see if 1 my partners is able to see her

## 2021-12-20 ENCOUNTER — Encounter: Payer: Self-pay | Admitting: Family Medicine

## 2021-12-20 ENCOUNTER — Ambulatory Visit (INDEPENDENT_AMBULATORY_CARE_PROVIDER_SITE_OTHER): Admitting: Family Medicine

## 2021-12-20 VITALS — BP 131/83 | HR 92 | Ht 67.0 in | Wt 160.0 lb

## 2021-12-20 DIAGNOSIS — R202 Paresthesia of skin: Secondary | ICD-10-CM | POA: Diagnosis not present

## 2021-12-20 DIAGNOSIS — R002 Palpitations: Secondary | ICD-10-CM | POA: Diagnosis not present

## 2021-12-20 DIAGNOSIS — R200 Anesthesia of skin: Secondary | ICD-10-CM

## 2021-12-20 DIAGNOSIS — R55 Syncope and collapse: Secondary | ICD-10-CM | POA: Diagnosis not present

## 2021-12-20 NOTE — Progress Notes (Signed)
Acute Office Visit  Subjective:     Patient ID: Margaret Sims, female    DOB: 07/14/1960, 61 y.o.   MRN: [id number]  CC: ER follow-up   HPI Patient is in today for ER follow-up.   This past Sunday, about 4 days ago, patient went out to eat with her family and after taking a few sips of an alcoholic drink and eating some soup she started feeling hot/dizzy and had a syncopal episode (x2).  Daughter reported that each time lasted about 10 seconds and she did end up having an incontinent episode, but family denied any shaking/seizure-like activity although she did feel hot/sweaty.  She went to the ED for work-up and received a liter of fluids prior to discharge.  Results were as follows below.  She is scheduled to pick up her ZIO monitor tomorrow.  Reports that since being home she has continued to have few episodes of palpitations/skipped beats and feeling flush a couple times per day, usually when at rest.  States sometimes she will be tired and starting to nod off when she feels her cell skipped beat causing her to wake up.  States she has had some lack of sleep over the past few months which she thinks may be related to recently quitting smoking.  She has not had any chest pain.  She has noticed a return of some right wrist/finger numbness/tingling primarily into thumb and index finger.  States that about a month ago she was having a crawling sensation and right arm feeling heavy but that had subsided.  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  She denies any chest pain, shortness of breath, wheezing, neck pain, fevers, weakness, slurred speech recurrent headaches, vision changes.  ED workup: EKG (x3) = NSR Echo = EF 55%, mild aortic regurg and mild to mod tricuspid regurg CT head = no acute findings; mild L maxillary sinus disease CXR = L basilar atelectasis, otherwise negative UA = negative Troponin - negative x2 CBC/CMP =  unremarkable   ROS All review of systems negative except what is listed in the HPI      Objective:    BP 131/83   Pulse 92   Ht 5\' 7"  (1.702 m)   Wt 160 lb (72.6 kg)   BMI 25.06 kg/m    Physical Exam Vitals reviewed.  Constitutional:      General: She is not in acute distress.    Appearance: Normal appearance. She is not ill-appearing.  Eyes:     Extraocular Movements: Extraocular movements intact.     Conjunctiva/sclera: Conjunctivae normal.     Pupils: Pupils are equal, round, and reactive to light.  Cardiovascular:     Rate and Rhythm: Normal rate and regular rhythm.     Pulses: Normal pulses.     Heart sounds: Normal heart sounds.  Pulmonary:     Effort: Pulmonary effort is normal.     Breath sounds: Normal breath sounds.  Musculoskeletal:     Cervical back: Normal range of motion and neck supple.     Comments: Right hand + Tinels; - Phalens  Skin:    General: Skin is warm and dry.  Neurological:     General: No focal deficit present.     Mental Status: She is alert and oriented to person, place, and time. Mental status is at baseline.     Cranial Nerves: No cranial nerve deficit.  Sensory: No sensory deficit.     Motor: No weakness.     Coordination: Coordination normal.     Gait: Gait normal.     Comments: Modified NIHSS = 0  Psychiatric:        Mood and Affect: Mood normal.        Behavior: Behavior normal.        Thought Content: Thought content normal.        Judgment: Judgment normal.     No results found for any visits on 12/20/21.      Assessment & Plan:   1. Syncope and collapse 2. Numbness and tingling of right arm 3. Palpitations No acute distress. Discussed case with PCP, Dr. Patsy Lager. Keep your appointment to get the heart monitor tomorrow.  We will add a neurology referral to let them further workup for other potential causes of your syncopal episode with incontinence along with your right arm numbness/tingling.   - Ambulatory  referral to Neurology   Return for - pending results/specialists or as needed .  Clayborne Dana, NP

## 2021-12-20 NOTE — Patient Instructions (Signed)
Keep your appointment to get the heart monitor tomorrow.  We will add a neurology referral to let them further workup for other potential causes of your syncopal episode with incontinence along with your right arm numbness/tingling.

## 2021-12-28 IMAGING — CT CT CHEST LUNG CANCER SCREENING LOW DOSE W/O CM
1 series · 10 of 10 positions shown, 13 images · non-contrast
Comparison: 11/08/2019

CLINICAL DATA: Lung cancer screening. Twenty pack-year history.
Current asymptomatic smoker.

EXAM:
CT CHEST WITHOUT CONTRAST LOW-DOSE FOR LUNG CANCER SCREENING
TECHNIQUE: Multidetector CT imaging of the chest was performed following the
standard protocol without IV contrast.

[ct lung segmentation data · axial · 0.59mm/px · z∈[-291,-291]mm · 10 of 288 frames shown]
[frame 1/288  mediastinal]
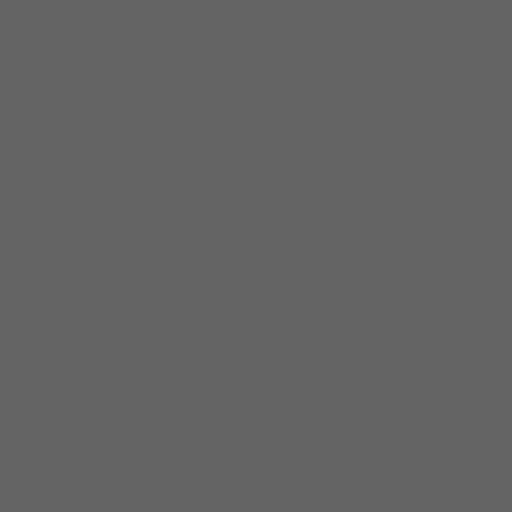
[frame 1/288  lung]
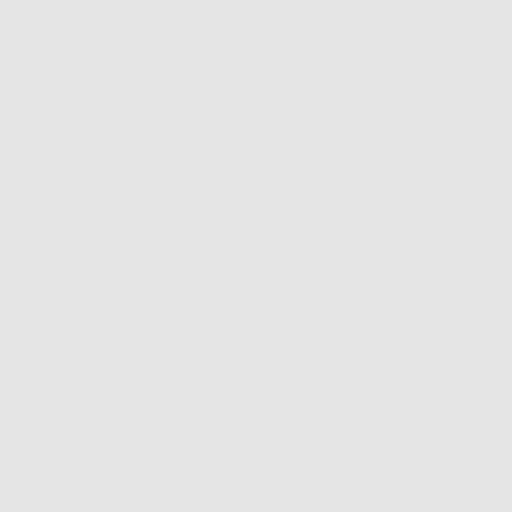
[frame 32/288  lung]
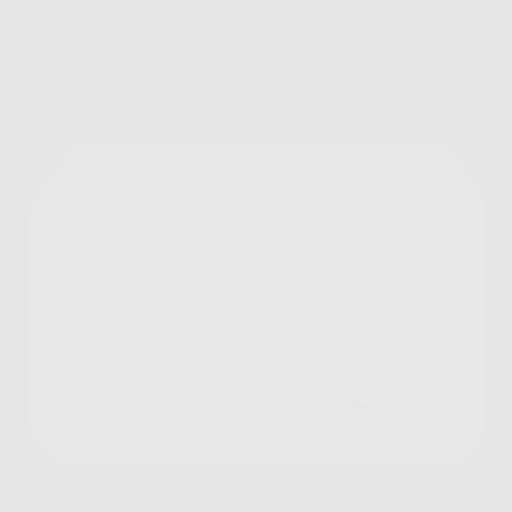
[frame 64/288  lung]
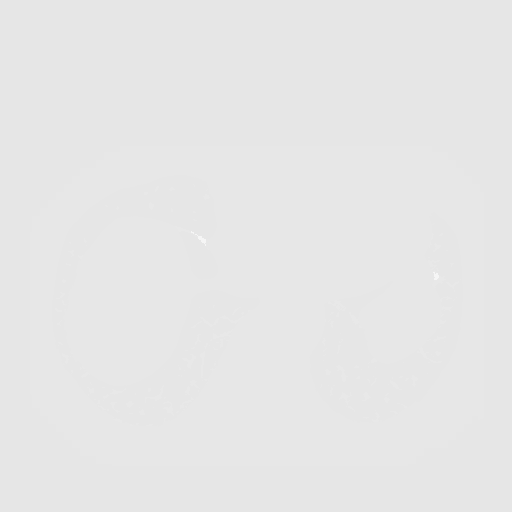
[frame 96/288  lung]
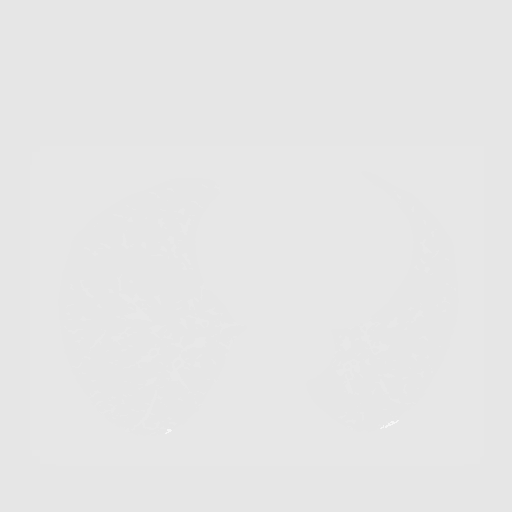
[frame 128/288  mediastinal]
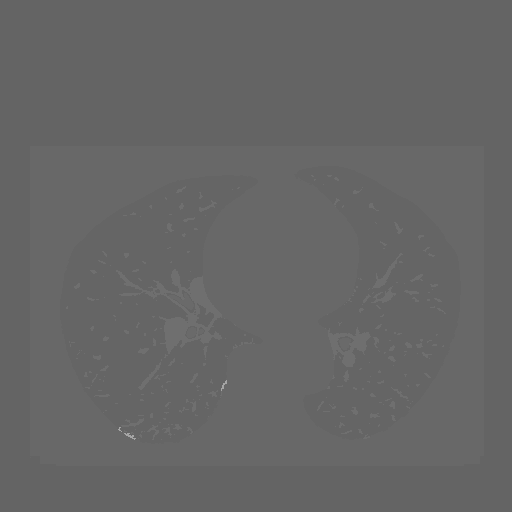
[frame 128/288  lung]
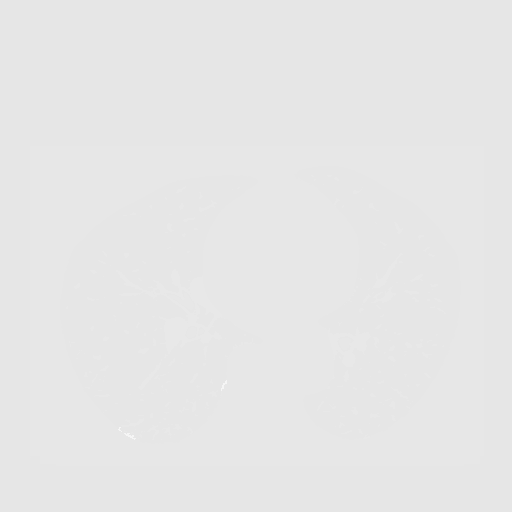
[frame 160/288  lung]
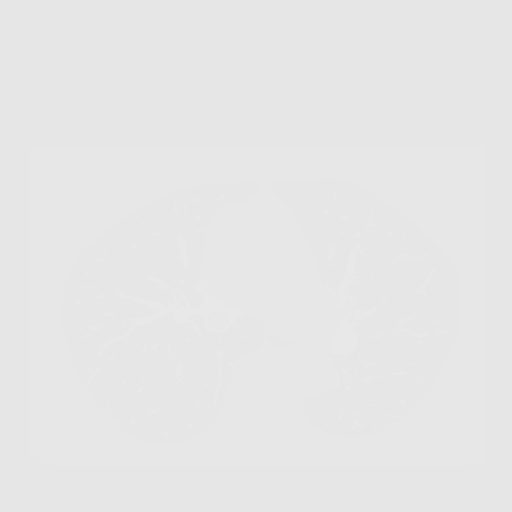
[frame 192/288  lung]
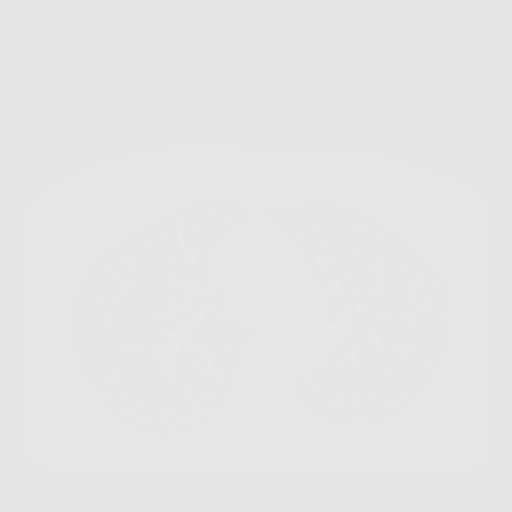
[frame 224/288  lung]
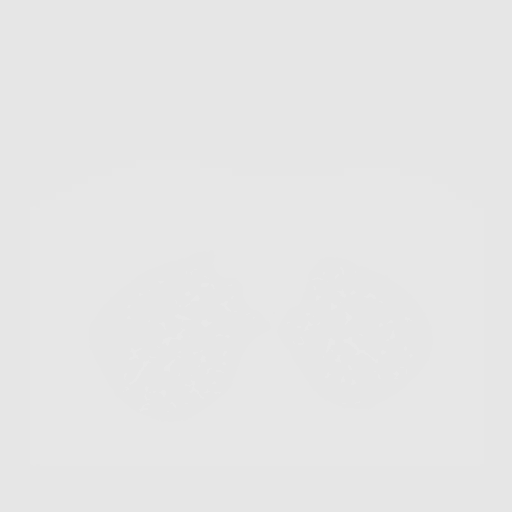
[frame 256/288  mediastinal]
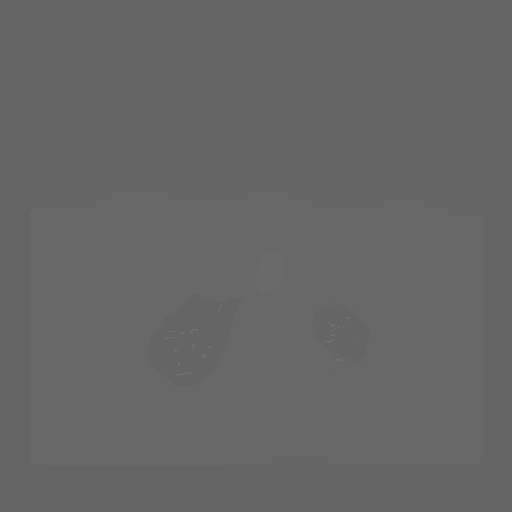
[frame 256/288  lung]
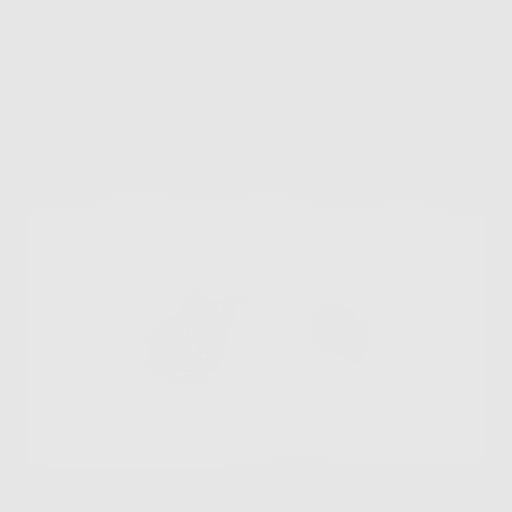
[frame 288/288  lung]
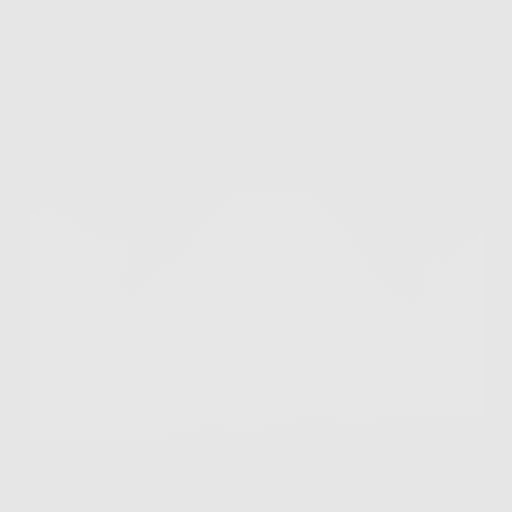

[10 of 10 positions shown; findings below may reference images not displayed]

FINDINGS: Cardiovascular: Heart size is normal. Aortic atherosclerosis with
coronary artery calcifications. No pericardial effusion identified.

Mediastinum/Nodes: No enlarged mediastinal, hilar, or axillary lymph
nodes. Thyroid gland, trachea, and esophagus demonstrate no
significant findings.

Lungs/Pleura: Mild to moderate changes of centrilobular emphysema.
No pleural effusion, airspace consolidation, or atelectasis. The
previously noted calcified and non solid lung nodules are stable in
the interval. Several new tiny nodules are identified within both
upper lobes. The largest nodule at this time is in the inferior
lingula and has a non-solid appearance with a mean derived diameter
of 9.2 mm. No suspicious lung nodules identified at this time.

Upper Abdomen: No acute abnormality.

Musculoskeletal: Mild degenerative disc disease. No acute or
suspicious osseous findings.
IMPRESSION: 1. Lung-RADS 2, benign appearance or behavior. Continue annual
screening with low-dose chest CT without contrast in 12 months.
2. Aortic Atherosclerosis (07QXT-BM0.0) and Emphysema (07QXT-[id number].B).
3. Coronary artery calcifications.

## 2022-01-08 ENCOUNTER — Encounter: Payer: Self-pay | Admitting: Family Medicine

## 2022-01-09 ENCOUNTER — Emergency Department (HOSPITAL_COMMUNITY)

## 2022-01-09 ENCOUNTER — Encounter: Payer: Self-pay | Admitting: *Deleted

## 2022-01-09 ENCOUNTER — Telehealth: Payer: Self-pay | Admitting: Family Medicine

## 2022-01-09 ENCOUNTER — Emergency Department (HOSPITAL_COMMUNITY)
Admission: EM | Admit: 2022-01-09 | Discharge: 2022-01-09 | Disposition: A | Discharge status: Home / Self Care | Attending: Emergency Medicine | Admitting: Emergency Medicine

## 2022-01-09 ENCOUNTER — Telehealth: Payer: Self-pay

## 2022-01-09 ENCOUNTER — Ambulatory Visit
Admission: EM | Admit: 2022-01-09 | Discharge: 2022-01-09 | Disposition: A | Discharge status: Home / Self Care | Attending: Emergency Medicine | Admitting: Emergency Medicine

## 2022-01-09 DIAGNOSIS — R200 Anesthesia of skin: Secondary | ICD-10-CM

## 2022-01-09 DIAGNOSIS — R9431 Abnormal electrocardiogram [ECG] [EKG]: Secondary | ICD-10-CM

## 2022-01-09 DIAGNOSIS — Z79899 Other long term (current) drug therapy: Secondary | ICD-10-CM | POA: Diagnosis not present

## 2022-01-09 DIAGNOSIS — I10 Essential (primary) hypertension: Secondary | ICD-10-CM | POA: Diagnosis not present

## 2022-01-09 DIAGNOSIS — R42 Dizziness and giddiness: Secondary | ICD-10-CM | POA: Insufficient documentation

## 2022-01-09 DIAGNOSIS — Z7982 Long term (current) use of aspirin: Secondary | ICD-10-CM | POA: Insufficient documentation

## 2022-01-09 DIAGNOSIS — R202 Paresthesia of skin: Secondary | ICD-10-CM | POA: Diagnosis not present

## 2022-01-09 DIAGNOSIS — R519 Headache, unspecified: Secondary | ICD-10-CM | POA: Diagnosis not present

## 2022-01-09 LAB — CBC WITH DIFFERENTIAL/PLATELET
Abs Immature Granulocytes: 0.02 10*3/uL (ref 0.00–0.07)
Abs Immature Granulocytes: 0.02 10*3/uL (ref 0.00–0.07)
Basophils Absolute: 0 10*3/uL (ref 0.0–0.1)
Basophils Absolute: 0 10*3/uL (ref 0.0–0.1)
Basophils Relative: 0 %
Basophils Relative: 0 %
Eosinophils Absolute: 0.1 10*3/uL (ref 0.0–0.5)
Eosinophils Absolute: 0.1 10*3/uL (ref 0.0–0.5)
Eosinophils Relative: 1 %
Eosinophils Relative: 1 %
HCT: 39.5 % (ref 36.0–46.0)
HCT: 40.2 % (ref 36.0–46.0)
Hemoglobin: 12.8 g/dL (ref 12.0–15.0)
Hemoglobin: 12.7 g/dL (ref 12.0–15.0)
Immature Granulocytes: 0 %
Immature Granulocytes: 0 %
Lymphocytes Relative: 40 %
Lymphocytes Relative: 42 %
Lymphs Abs: 2.7 10*3/uL (ref 0.7–4.0)
Lymphs Abs: 2.9 10*3/uL (ref 0.7–4.0)
MCH: 32.1 pg (ref 26.0–34.0)
MCH: 31.7 pg (ref 26.0–34.0)
MCHC: 32.4 g/dL (ref 30.0–36.0)
MCHC: 31.6 g/dL (ref 30.0–36.0)
MCV: 99 fL (ref 80.0–100.0)
MCV: 100.2 fL — ABNORMAL HIGH (ref 80.0–100.0)
Monocytes Absolute: 0.5 10*3/uL (ref 0.1–1.0)
Monocytes Absolute: 0.6 10*3/uL (ref 0.1–1.0)
Monocytes Relative: 8 %
Monocytes Relative: 9 %
Neutro Abs: 3.5 10*3/uL (ref 1.7–7.7)
Neutro Abs: 3.4 10*3/uL (ref 1.7–7.7)
Neutrophils Relative %: 51 %
Neutrophils Relative %: 48 %
Platelets: 216 10*3/uL (ref 150–400)
Platelets: 224 10*3/uL (ref 150–400)
RBC: 3.99 MIL/uL (ref 3.87–5.11)
RBC: 4.01 MIL/uL (ref 3.87–5.11)
RDW: 12.5 % (ref 11.5–15.5)
RDW: 12.5 % (ref 11.5–15.5)
WBC: 6.9 10*3/uL (ref 4.0–10.5)
WBC: 7.1 10*3/uL (ref 4.0–10.5)
nRBC: 0 % (ref 0.0–0.2)
nRBC: 0 % (ref 0.0–0.2)

## 2022-01-09 LAB — COMPREHENSIVE METABOLIC PANEL
ALT: 16 U/L (ref 0–44)
AST: 27 U/L (ref 15–41)
Albumin: 3.3 g/dL — ABNORMAL LOW (ref 3.5–5.0)
Alkaline Phosphatase: 75 U/L (ref 38–126)
Anion gap: 11 (ref 5–15)
BUN: 12 mg/dL (ref 8–23)
CO2: 24 mmol/L (ref 22–32)
Calcium: 9.6 mg/dL (ref 8.9–10.3)
Chloride: 106 mmol/L (ref 98–111)
Creatinine, Ser: 1.04 mg/dL — ABNORMAL HIGH (ref 0.44–1.00)
GFR, Estimated: >60 mL/min (ref >60)
Glucose, Bld: 87 mg/dL (ref 70–99)
Potassium: 4.1 mmol/L (ref 3.5–5.1)
Sodium: 141 mmol/L (ref 135–145)
Total Bilirubin: 0.5 mg/dL (ref 0.3–1.2)
Total Protein: 6.2 g/dL — ABNORMAL LOW (ref 6.5–8.1)

## 2022-01-09 LAB — TROPONIN I (HIGH SENSITIVITY)
Troponin I (High Sensitivity): 9 ng/L (ref <18)
Troponin I (High Sensitivity): 10 ng/L (ref <18)

## 2022-01-09 MED ORDER — MAGNESIUM SULFATE 2 GM/50ML IV SOLN
2.0000 g | Freq: Once | INTRAVENOUS | Status: AC
Start: 2022-01-09 — End: 2022-01-09
  Administered 2022-01-09: 2 g via INTRAVENOUS
  Filled 2022-01-09: qty 50

## 2022-01-09 MED ORDER — KETOROLAC TROMETHAMINE 30 MG/ML IJ SOLN
30.0000 mg | Freq: Once | INTRAMUSCULAR | Status: AC
  Administered 2022-01-09: 30 mg via INTRAVENOUS
  Filled 2022-01-09: qty 1

## 2022-01-09 MED ORDER — DEXAMETHASONE SODIUM PHOSPHATE 10 MG/ML IJ SOLN
8.0000 mg | Freq: Once | INTRAMUSCULAR | Status: AC
Start: 2022-01-09 — End: 2022-01-09
  Administered 2022-01-09: 8 mg via INTRAVENOUS
  Filled 2022-01-09: qty 1

## 2022-01-09 MED ORDER — ASPIRIN 81 MG PO CHEW
243.0000 mg | CHEWABLE_TABLET | Freq: Once | ORAL | Status: AC
  Administered 2022-01-09: 243 mg via ORAL

## 2022-01-09 MED ORDER — METOCLOPRAMIDE HCL 5 MG/ML IJ SOLN
10.0000 mg | Freq: Once | INTRAMUSCULAR | Status: AC
  Administered 2022-01-09: 10 mg via INTRAVENOUS
  Filled 2022-01-09: qty 2

## 2022-01-09 MED ORDER — ACETAMINOPHEN 500 MG PO TABS
1000.0000 mg | ORAL_TABLET | Freq: Once | ORAL | Status: AC
Start: 2022-01-09 — End: 2022-01-09
  Administered 2022-01-09: 1000 mg via ORAL
  Filled 2022-01-09: qty 2

## 2022-01-09 MED ORDER — DIPHENHYDRAMINE HCL 50 MG/ML IJ SOLN
25.0000 mg | Freq: Once | INTRAMUSCULAR | Status: AC
  Administered 2022-01-09: 25 mg via INTRAVENOUS
  Filled 2022-01-09: qty 1

## 2022-01-09 NOTE — ED Notes (Signed)
Lab at [facility] for redraw of CBC and repeat troponin

## 2022-01-09 NOTE — ED Notes (Signed)
EDP at bedside  

## 2022-01-09 NOTE — ED Notes (Signed)
Pt ambulatory to restroom and back with no difficulty

## 2022-01-09 NOTE — Discharge Instructions (Addendum)
You are treated for a possible migraine headache today with IV medications.  Your brain imaging did not show signs of a stroke, tumor, or other life-threatening emergencies.  Your blood work is reassuring, including your EKG and heart enzyme levels.  If you do experience sudden or worsening chest pain, difficulty breathing, loss of consciousness, persistent vomiting, severe worsening headache, or strokelike symptoms, please call 911 or return to the ER.  Referral was placed to neurology for outpatient follow-up.  From below visit 3 business days, please call the number above to schedule follow-up appointment.  You can continue taking Tylenol and ibuprofen as needed for headache pain at home.

## 2022-01-09 NOTE — ED Triage Notes (Addendum)
BIB [facility] from [facility] for dizziness and HTN. Reported as ongoing for months and has been seen for the same, and onset this am upon waking. Reports recent dizziness, high BP, tingling in R arm, and multiple syncopal episodes. No syncope today. Sent for concern with EKG including LVH, which EMS reports as not new. H/o LVH and HTN. She has been referred to neuro. CBG 100. VSS.   Addendum: see 1525 note clarification per sending provider given via secure messaging

## 2022-01-09 NOTE — ED Provider Notes (Signed)
[facility] EMERGENCY DEPARTMENT Provider Note   CSN: [id number] Arrival date & time: 01/09/22  1438     History  Chief Complaint  Patient presents with   Tingling    Tingling in fingers since June 13  has been seen at [facility] and UC    Margaret Sims is a 61 y.o. female with Hx of HTN presenting with new onset of dizziness and headache as of yesterday.  Waxes and wanes, was worse yesterday, but still very present today.  Elaborates dizziness as feeling as if she is spinning.  Does not often get headaches, this is unusual.  Went to UC with these concerns, EKG showed suspicious T wave changes and possible ST changes.  Was encouraged to come to the ED.  This is also in the lieu of her continued right arm and leg numbness and tingling that is continued since mid June while after 2 syncopal episodes back to back.  Was evaluated in the ED and by cardiology, without any definitive findings.  Was in the process of trying to follow-up with neurology for further evaluation.  Denies N/V, constipation diarrhea, vision changes, abdominal pain, chest pain, shortness of breath, fevers, chills, neck stiffness, back pain, IVDU, hemoptysis, unilateral vs upper/lower extremity weakness, prior malignancy.  Takes lisinopril for her hypertension.  45-year tobacco history, recently quit 3 months ago.  The history is provided by the patient and medical records.      Home Medications Prior to Admission medications   Medication Sig Start Date End Date Taking? Authorizing Provider  aspirin 81 MG tablet Take 81 mg by mouth daily.    [provider]  atorvastatin (LIPITOR) 40 MG tablet TAKE ONE (1) TABLET BY MOUTH EACH DAY Patient taking differently: Take 40 mg by mouth daily. 02/20/21   Copland, [name], MD  lisinopril (ZESTRIL) 40 MG tablet TAKE ONE (1) TABLET BY MOUTH EACH DAY Patient taking differently: Take 40 mg by mouth daily. 02/20/21   Copland, [name], MD  omeprazole  (PRILOSEC) 40 MG capsule TAKE 1 CAPSULE BY MOUTH DAILY Patient taking differently: Take 40 mg by mouth daily. 02/20/21   Copland, [name], MD  VITAMIN D PO Take 1 tablet by mouth daily.    [provider]      Allergies    Patient has no known allergies.    Review of Systems   Review of Systems  Neurological:  Positive for dizziness and headaches.    Physical Exam Updated Vital Signs BP (!) 160/100   Pulse 77   Temp 98.9 F (37.2 C) (Oral)   Resp 18   SpO2 98%  Physical Exam Vitals and nursing note reviewed.  Constitutional:      General: She is not in acute distress.    Appearance: She is well-developed. She is not ill-appearing, toxic-appearing or diaphoretic.  HENT:     Head: Normocephalic and atraumatic.  Eyes:     General: Lids are normal. Vision grossly intact. Gaze aligned appropriately. No visual field deficit.    Extraocular Movements: Extraocular movements intact.     Right eye: No nystagmus.     Left eye: No nystagmus.     Conjunctiva/sclera: Conjunctivae normal.     Right eye: Right conjunctiva is not injected.     Left eye: Left conjunctiva is not injected.     Pupils: Pupils are equal, round, and reactive to light.     Visual Fields: Right eye visual fields normal and left eye  visual fields normal.  Cardiovascular:     Rate and Rhythm: Normal rate and regular rhythm.     Pulses: Normal pulses.     Heart sounds: Normal heart sounds. No murmur heard. Pulmonary:     Effort: Pulmonary effort is normal. No respiratory distress.     Breath sounds: Normal breath sounds.  Abdominal:     Palpations: Abdomen is soft.     Tenderness: There is no abdominal tenderness.  Musculoskeletal:        General: No swelling.     Cervical back: Neck supple.     Right lower leg: No edema.     Left lower leg: No edema.  Skin:    General: Skin is warm and dry.     Capillary Refill: Capillary refill takes less than 2 seconds.     Coloration: Skin is not jaundiced  or pale.     Findings: No erythema.  Neurological:     General: No focal deficit present.     Mental Status: She is alert and oriented to person, place, and time.     GCS: GCS eye subscore is 4. GCS verbal subscore is 5. GCS motor subscore is 6.     Cranial Nerves: No dysarthria or facial asymmetry.     Sensory: Sensation is intact.     Motor: Motor function is intact. No tremor, seizure activity or pronator drift.     Coordination: Coordination is intact. Finger-Nose-Finger Test and Heel to [facility] Test normal.     Gait: Gait normal.     Comments: Normal EOMs.  PERRLA.  Without truncal weakness or leaning to one side.  Strength equal in all extremities.  Psychiatric:        Mood and Affect: Mood normal.     ED Results / Procedures / Treatments   Labs (all labs ordered are listed, but only abnormal results are displayed) Labs Reviewed  COMPREHENSIVE METABOLIC PANEL - Abnormal; Notable for the following components:      Result Value   Creatinine, Ser 1.04 (*)    Total Protein 6.2 (*)    Albumin 3.3 (*)    All other components within normal limits  CBC WITH DIFFERENTIAL/PLATELET - Abnormal; Notable for the following components:   MCV 100.2 (*)    All other components within normal limits  CBC WITH DIFFERENTIAL/PLATELET  TROPONIN I (HIGH SENSITIVITY)  TROPONIN I (HIGH SENSITIVITY)    EKG EKG Interpretation  Date/Time:  Wednesday January 09 2022 14:52:59 EDT Ventricular Rate:  65 PR Interval:  150 QRS Duration: 80 QT Interval:  377 QTC Calculation: 392 R Axis:   -33 Text Interpretation: Sinus rhythm Probable left atrial enlargement Abnormal R-wave progression, late transition Left ventricular hypertrophy Confirmed by Octaviano Glow [phone]) on 01/09/2022 4:52:55 PM  Radiology CT Head Wo Contrast  Result Date: 01/09/2022 CLINICAL DATA:  Neuro deficit, acute, stroke suspected Dizziness, persistent/recurrent, cardiac or vascular cause suspected Dizziness and finger tingling.  Hypertension. Tingling in right arm. EXAM: CT HEAD WITHOUT CONTRAST TECHNIQUE: Contiguous axial images were obtained from the base of the skull through the vertex without intravenous contrast. RADIATION DOSE REDUCTION: This exam was performed according to the departmental dose-optimization program which includes automated exposure control, adjustment of the mA and/or kV according to patient size and/or use of iterative reconstruction technique. COMPARISON:  Head CT 12/16/2021 at [place]: Brain: No intracranial hemorrhage, mass effect, or midline shift. Brain volume is normal for age. No hydrocephalus. Incidental cavum septum pellucidum, normal variant  anatomy. The basilar cisterns are patent. No evidence of territorial infarct or acute ischemia. No extra-axial or intracranial fluid collection. Vascular: Atherosclerosis of skullbase vasculature without hyperdense vessel or abnormal calcification. Skull: Normal. Negative for fracture or focal lesion. Sinuses/Orbits: Trace fluid in the lower left mastoid air cells. The paranasal sinuses are clear. Other: None. IMPRESSION: 1. No acute intracranial abnormality. 2. Trace fluid in the lower left mastoid air cells. Electronically Signed   By: Keith Rake M.D.   On: 01/09/2022 19:03   DG Chest 2 View  Result Date: 01/09/2022 CLINICAL DATA:  Chest pain EXAM: CHEST - 2 VIEW COMPARISON:  December 16, 2021 FINDINGS: The heart size and mediastinal contours are within normal limits. Both lungs are clear. No consolidation, vascular congestion or pleural effusion. The visualized skeletal structures are unremarkable. IMPRESSION: No active cardiopulmonary disease. Electronically Signed   By: Frazier Richards M.D.   On: 01/09/2022 17:13    Procedures Procedures    Medications Ordered in ED Medications  diphenhydrAMINE (BENADRYL) injection 25 mg (25 mg Intravenous Given 01/09/22 2005)  metoCLOPramide (REGLAN) injection 10 mg (10 mg Intravenous Given 01/09/22 2005)     ED Course/ Medical Decision Making/ A&P                           Medical Decision Making Amount and/or Complexity of Data Reviewed Labs: ordered. Radiology: ordered.  Risk Prescription drug management.   61 y.o. female presents to the ED for concern of Tingling (Tingling in fingers since June 13  has been seen at [facility] and UC)   This involves an extensive number of treatment options, and is a complaint that carries with it a high risk of complications and morbidity.     Past Medical History / Co-morbidities / Social History: Hx of HTN  Additional History:  Internal and external records from outside source obtained and reviewed including ED visits  Lab Tests: I ordered, and personally interpreted labs.  The pertinent results include:   CBC: Unremarkable CMP/BMP: Unremarkable Troponin: initial 9, sequential 10  Imaging Studies: I ordered imaging studies including CXR and CT head .   I independently visualized and interpreted imaging which showed CXR negative for acute cardiopulmonary pathology, CT head negative for acute intracranial pathology or acute cardiopulmonary pathology I agree with the radiologist interpretation. MRI brain pending  ED Course: Pt well-appearing on exam.  With new onset of bad headache and dizziness as of yesterday.  Slightly improved today from yesterday, but does not often get headaches and this is unusual for her.  Then had a "suspicious finding" on EKG at urgent care today, and encouraged to come to the ED for further evaluation.  EKG on today's exam with suggestive LVH, without other significant abnormalities.  Nonseptic, nontoxic appearing in NAD.  Neuro exam unremarkable without focal deficits as described above.  Also with right-sided tingling and/or numbness of the right fingers/arm and right leg, which began about a month ago when she had a back-to-back syncopal episode.  Was worked up in the ED, focused on the cardiology work-up of  etiology, which ended up negative.  In the process of trying to further evaluate with neurology, but has not been able to establish care yet.  Chest x-ray, troponins, EKG not suggestive of myocardial infarction.  Low suspicion for ACS at this time.  Plan to proceed with CT imaging of head.  Possible vascular or neurology etiology.  Differential still broad. CT imaging of  head negative for acute intracranial pathology.  Low suspicion for [facility] or ICH.  Migraine cocktail provided.  Plan to proceed with MRI imaging to further assess for possible mass or CVA. MRI imaging pending.  Disposition: 2020  care of Cloris Flippo Meister transferred to Dr. Renaye Rakers at the end of my shift.  Patient case discussed at length.  Please see his/her note for further details.  Plan at time of handoff is dependent on MRI results and response to migraine cocktail.  If MRI findings are suspicious/significant or unable to relieve headache, likely admission for intractable migraine or suggested etiology.  May consider consultation with neurology as well.  This may be altered or completely changed at the discretion of the oncoming team pending results of further workup.  This patient was a shared case encounter with my attending, Dr. Renaye Rakers, who directly contributed to the proposed treatment course and cosigned this note including patient's presenting symptoms, physical exam, and planned diagnostics and interventions.  Attending physician stated agreement with plan or made changes to plan which were implemented.     This chart was dictated using voice recognition software.  Despite best efforts to proofread, errors can occur which can change the documentation meaning.         Final Clinical Impression(s) / ED Diagnoses Final diagnoses:  Dizziness  Acute intractable headache, unspecified headache type  Numbness and tingling of right arm and leg    Rx / DC Orders ED Discharge Orders     None         Sandrea Hammond 01/09/22 2048    Terald Sleeper, MD 01/09/22 [phone]

## 2022-01-09 NOTE — ED Notes (Signed)
Sending provider clarifies: Sent her because the T-wave inversion in lead III is new as is the peaked T wave in V2 and her BP is significantly higher than usual despite taking her lisinopril today.  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  She has also had multiple episodes of unexplained syncope and collapse.  Pt recently had ZIO done at [facility], results are still pending.

## 2022-01-09 NOTE — ED Notes (Signed)
Pt back from MRI - states that her headache is worst - MD Trifan notified

## 2022-01-09 NOTE — ED Notes (Signed)
Back from CT

## 2022-01-09 NOTE — Telephone Encounter (Signed)
In your referral note is states " An appointment is requested in approximately: 1 week" are you okay with placing the urgent referral?

## 2022-01-09 NOTE — Telephone Encounter (Signed)
My chart message sent to pt.

## 2022-01-09 NOTE — ED Triage Notes (Addendum)
Pt c/o numbness in her right arm, tingling in right fingers, numbness to left foot, dizzy, and headaches (focused on the left side). The patient denies chest pain and SOB, she is A&Ox4, bilateral grips equal and is able to push against resistance (bilateral arms and legs).   Started: mid June

## 2022-01-09 NOTE — ED Notes (Signed)
Pt taken to MRI  

## 2022-01-09 NOTE — Telephone Encounter (Signed)
Patient currently at Urgent Care for evaluation/treatment.   [facility] - Client TELEPHONE ADVICE RECORD Access Nurse Patient Name:Margaret Sims Initial Comment Caller states she is having numbness and tingling to right arm and fingers. Lightheadedness and dizziness Translation No Nurse Assessment Nurse: Lane Hacker, RN, Elvin So Date/Time (Eastern Time): 01/09/2022 10:30:04 AM Confirm and document reason for call. If symptomatic, describe symptoms. ---Caller c/o right arm/hand/fingers with N/T. And yesterday, with left foot numbness. And c/ o lightheadedness and dizziness first thing in am when she first gets up off/on and does not last long (yesterday, took about hr to go away, and just started to feel dizzy/hot/sweaty). In June, she passed out, and s/s followed. Seen in ER, admitted overnight. Monday noticed N/T. Followed up with MD 2 days later. Wore a 7 day heart monitor. Supposed to f/u neurologist, but hasn't heard from office. Does the patient have any new or worsening symptoms? ---Yes Will a triage be completed? ---Yes Related visit to physician within the last 2 weeks? ---No Does the PT have any chronic conditions? (i.e. diabetes, asthma, this includes High risk factors for pregnancy, etc.) ---Yes List chronic conditions. 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 ---No Guidelines Guideline Title Affirmed Question Affirmed Notes Nurse Date/Time Lamount Cohen Time) Neurologic Deficit [1] Numbness (i.e., loss of sensation) of Lane Hacker, RN, Elvin So 01/09/2022 10:35:33 AM PLEASE NOTE: All timestamps contained within this report are represented as [place] Standard Time. CONFIDENTIALTY NOTICE: This fax transmission is intended only for the addressee. It contains information that is legally privileged, confidential or otherwise protected from use or disclosure. If you are not the intended  recipient, you are strictly prohibited from reviewing, disclosing, copying using or disseminating any of this information or taking any action in reliance on or regarding this information. If you have received this fax in error, please notify us immediately by telephone so that we can arrange for its return to [place]. Phone: [phone], Toll-Free: [phone], Fax: [phone] Page: 2 of 2 Call Id: [id number] Guidelines Guideline Title Affirmed Question Affirmed Notes Nurse Date/Time Lamount Cohen Time) the face, arm / hand, or leg / foot on one side of the body AND [2] sudden onset AND [3] present now Disp. Time Lamount Cohen Time) Disposition Final User 01/09/2022 10:37:48 AM Call EMS 911 Now Yes Lane Hacker, RN, [facility] 01/09/2022 10:48:38 AM 911 Outcome Documentation Lane Hacker, RN, Elvin So Reason: Declined calling 911 because she's had the N/T for wks. RN also advised with unsteady walking needs to have stroke r/o. 01/09/2022 10:49:07 AM Called On-Call Provider Lane Hacker, RN, Elvin So Reason: Tamika at office notified of 911 declined, and pt status Final Disposition 01/09/2022 10:37:48 AM Call EMS 911 Now Yes Lane Hacker, RN, Elvin So Caller Disagree/Comply Comply Caller Understands Yes PreDisposition InappropriateToAsk Care Advice Given Per Guideline CALL EMS 911 NOW: * Immediate medical attention is needed. You need to hang up and call 911 (or an ambulance). CARE ADVICE given per Neurologic Deficit (Adult) guideline. Comments User: Rubie Maid, RN Date/Time Lamount Cohen Time): 01/09/2022 10:36:01 AM She feels off balance right now. User: Rubie Maid, RN Date/Time Lamount Cohen Time): 01/09/2022 10:37:39 AM numbness is felt all the time, and started suddenly around mid June

## 2022-01-09 NOTE — ED Notes (Signed)
Pt in CT/xray

## 2022-01-09 NOTE — ED Notes (Signed)
Patient is being discharged from the Urgent Care and sent to the Emergency Department via EMS. Per L. Morgan-Scales PA-C , patient is in need of higher level of care due to Need for further evaluation . Patient is aware and verbalizes understanding of plan of care.  Vitals:   01/09/22 1302 01/09/22 1305  BP: (!) 168/104 (!) 154/104  Pulse: 82   Resp: 18   Temp: 98.7 F (37.1 C)   SpO2: 97%

## 2022-01-09 NOTE — ED Notes (Signed)
Patient verbalizes understanding of discharge instructions. Opportunity for questioning and answers were provided. Armband removed by staff, pt discharged from ED ambulatory.   

## 2022-01-09 NOTE — ED Provider Notes (Addendum)
[facility]    CSN: [id number] Arrival date & time: 01/09/22  1254    HISTORY   Chief Complaint  Patient presents with   Numbness   Dizziness   HPI Margaret Sims is a 61 y.o. female. Pt c/o numbness in her right arm, tingling in right fingers, numbness to left foot, dizzy, and headaches (focused on the left side). The patient denies chest pain and SOB, she is A&Ox4, bilateral grips equal and is able to push against resistance (bilateral arms and legs).  Patient states she has been dealing with these issues since June.  EMR reviewed.  Patient was [facility] ED last month, EKG did not reveal any T wave inversion.  EKG today reveals T wave inversion in lead III with suspicion of elevation in V1 and V2.  She took a baby aspirin prior to her arrival today.  The history is provided by the patient.   Past Medical History:  Diagnosis Date   Acid reflux    Diabetes type 2, controlled (HCC) 04/16/2016   High cholesterol    Hypertension    Patient Active Problem List   Diagnosis Date Noted   Type 2 diabetes mellitus with diabetic neuropathy, unspecified (HCC) 05/01/2020   Essential hypertension 02/19/2016   Dyslipidemia 02/19/2016   Tobacco use disorder 02/19/2016   Past Surgical History:  Procedure Laterality Date   CESAREAN SECTION     OB History   No obstetric history on file.    Home Medications    Prior to Admission medications   Medication Sig Start Date End Date Taking? Authorizing Provider  aspirin 81 MG tablet Take 81 mg by mouth daily.    [provider]  atorvastatin (LIPITOR) 40 MG tablet TAKE ONE (1) TABLET BY MOUTH EACH DAY 02/20/21   Copland, [name], MD  lisinopril (ZESTRIL) 40 MG tablet TAKE ONE (1) TABLET BY MOUTH EACH DAY 02/20/21   Copland, [name], MD  omeprazole (PRILOSEC) 40 MG capsule TAKE 1 CAPSULE BY MOUTH DAILY 02/20/21   Copland, [name], MD  VITAMIN D PO Take by mouth.    [provider]    Family  History History reviewed. No pertinent family history. Social History Social History   Tobacco Use   Smoking status: Every Day    Packs/day: 0.50    Types: Cigarettes   Smokeless tobacco: Never  Vaping Use   Vaping Use: Never used  Substance Use Topics   Alcohol use: Yes    Comment: 1-2 times per week   Drug use: Never   Allergies   Patient has no known allergies.  Review of Systems Review of Systems Pertinent findings noted in history of present illness.   Physical Exam Triage Vital Signs ED Triage Vitals  Enc Vitals Group     BP 05/04/21 0827 (!) 147/82     Pulse Rate 05/04/21 0827 72     Resp 05/04/21 0827 18     Temp 05/04/21 0827 98.3 F (36.8 C)     Temp Source 05/04/21 0827 Oral     SpO2 05/04/21 0827 98 %     Weight --      Height --      Head Circumference --      Peak Flow --      Pain Score 05/04/21 0826 5     Pain Loc --      Pain Edu? --      Excl. in GC? --  No data found.  Updated Vital Signs BP (!) 154/104 (BP Location: Left Arm)   Pulse 82   Temp 98.7 F (37.1 C) (Oral)   Resp 18   SpO2 97%   Physical Exam Vitals and nursing note reviewed.  Constitutional:      General: She is in acute distress.     Appearance: Normal appearance. She is not ill-appearing, toxic-appearing or diaphoretic.  HENT:     Head: Normocephalic and atraumatic.  Eyes:     Pupils: Pupils are equal, round, and reactive to light.  Cardiovascular:     Rate and Rhythm: Normal rate and regular rhythm.     Pulses: Normal pulses.  Pulmonary:     Effort: Pulmonary effort is normal.     Breath sounds: Normal breath sounds.  Musculoskeletal:        General: Normal range of motion.     Cervical back: Normal range of motion and neck supple.  Skin:    General: Skin is warm and dry.  Neurological:     General: No focal deficit present.     Mental Status: She is alert and oriented to person, place, and time. Mental status is at baseline.  Psychiatric:        Mood and  Affect: Mood normal.        Behavior: Behavior normal.        Thought Content: Thought content normal.        Judgment: Judgment normal.     Visual Acuity Right Eye Distance:   Left Eye Distance:   Bilateral Distance:    Right Eye Near:   Left Eye Near:    Bilateral Near:     [facility] Couse / Diagnostics / Procedures:    EKG  Radiology No results found.  Transthoracic Echocardiogram Report Name: Margaret Sims, Margaret Sims          Study Date: 12/17/2021       Height: 67 in MRN: [id number]                         Patient Location: [id number]  Weight: 163 lb DOB: Apr 29, 1961                      Gender: Female               BSA: 1.9 m2 Age: 54 yrs                          Ethnicity: B                 BP: 128/82 mmHg Reason For Study: SYNCOPE/FAINTING; Syncope; Syncope, unspecified syncope type History: Syncope Ordering Physician: Aundria Rud    Performed By: Lorie Phenix Referring Physician: SELF, A REFERRAL  SUMMARY The left ventricular size is normal. Left ventricular systolic function is normal. LV ejection fraction = 55%. No segmental wall motion abnormalities seen in the left ventricle The right ventricle is normal in size and function. There is mild aortic regurgitation. There is mild to moderate tricuspid regurgitation. No pulmonary hypertension. The aortic sinus is normal size. IVC size was normal. There is no pericardial effusion. There is no comparison study available.  - FINDINGS: LEFT VENTRICLE The left ventricular size is normal. There is normal left ventricular wall thickness. Left ventricular systolic function is normal. LV ejection fraction = 55%. Left ventricular filling pattern is prolonged relaxation. No segmental wall motion  abnormalities seen in the left ventricle.  - RIGHT VENTRICLE The right ventricle is normal in size and function.  LEFT ATRIUM The left atrial size is normal.  RIGHT ATRIUM Right atrial size is normal. - AORTIC  VALVE There is mild aortic valve thickening. There is no aortic stenosis. There is mild aortic regurgitation. - MITRAL VALVE There is mild mitral valve thickening. There is trace mitral regurgitation. - TRICUSPID VALVE There is mild tricuspid valve thickening. There is mild to moderate tricuspid regurgitation. Estimated right ventricular systolic pressure is 32 mmHg. No pulmonary hypertension. - PULMONIC VALVE There is trivial pulmonic valve thickening. Trace pulmonic valvular regurgitation. There is no pulmonic valvular stenosis. - ARTERIES The aortic sinus is normal size. The ascending aorta is normal size. - VENOUS IVC size was normal. - EFFUSION There is no pericardial effusion.  Procedures ED EKG  Date/Time: 01/09/2022 2:23 PM  Performed by: Lynden Oxford Scales, PA-C Authorized by: Lynden Oxford Scales, PA-C   Previous ECG:    Previous ECG:  Compared to current Interpretation:    Interpretation: abnormal     Details:  T wave inversion present in lead III with mild ST elevation in V1 and V2 is new compared to EKG 1 month ago ST segments:    ST segments:  Elevation   Elevation:  V1 and V2 T waves:    T waves: inverted     Inverted:  III Q waves:    Abnormal Q-waves: not present   Other findings:    Other findings: LVH    (including critical care time)  UC Diagnoses / Final Clinical Impressions(s)   I have reviewed the triage vital signs and the nursing notes.  Pertinent labs & imaging results that were available during my care of the patient were reviewed by me and considered in my medical decision making (see chart for details).    Final diagnoses:  T wave inversion in EKG  Essential hypertension  Numbness and tingling of left upper extremity   Patient presents today with her spouse.  Patient advised that I recommend she go to the emergency room for further evaluation to rule out myocardial infarction given new T wave inversion not present 1 month  ago on EKG.  Patient transported via EMS.  ED Prescriptions   None    PDMP not reviewed this encounter.  Pending results:  Labs Reviewed - No data to display  Medications Ordered in UC: Medications  aspirin chewable tablet 243 mg (243 mg Oral Given 01/09/22 1353)    Disposition Upon Discharge:  Condition: stable for discharge home Home: take medications as prescribed; routine discharge instructions as discussed; follow up as advised.  Patient presented with an acute illness with associated systemic symptoms and significant discomfort requiring urgent management. In my opinion, this is a condition that a prudent lay person (someone who possesses an average knowledge of health and medicine) may potentially expect to result in complications if not addressed urgently such as respiratory distress, impairment of bodily function or dysfunction of bodily organs.   Routine symptom specific, illness specific and/or disease specific instructions were discussed with the patient and/or caregiver at length.   As such, the patient has been evaluated and assessed, work-up was performed and treatment was provided in alignment with urgent care protocols and evidence based medicine.  Patient/parent/caregiver has been advised that the patient may require follow up for further testing and treatment if the symptoms continue in spite of treatment, as clinically indicated and appropriate.  Patient/parent/caregiver  has been advised to return to the [facility] or PCP if no better; to PCP or the Emergency Department if new signs and symptoms develop, or if the current signs or symptoms continue to change or worsen for further workup, evaluation and treatment as clinically indicated and appropriate  The patient will follow up with their current PCP if and as advised. If the patient does not currently have a PCP we will assist them in obtaining one.   The patient may need specialty follow up if the symptoms continue, in  spite of conservative treatment and management, for further workup, evaluation, consultation and treatment as clinically indicated and appropriate.   Patient/parent/caregiver verbalized understanding and agreement of plan as discussed.  All questions were addressed during visit.  Please see discharge instructions below for further details of plan.  Discharge Instructions: Discharge Instructions   None     This office note has been dictated using Dragon speech recognition software.  Unfortunately, and despite my best efforts, this method of dictation can sometimes lead to occasional typographical or grammatical errors.  I apologize in advance if this occurs.     Theadora Rama Scales, PA-C 01/09/22 1353    Theadora Rama Scales, PA-C 01/09/22 1424

## 2022-01-09 NOTE — ED Notes (Signed)
Dr Trifan at bedside  

## 2022-01-09 NOTE — Telephone Encounter (Signed)
Pt called stating that she found an office that might be able to see her sooner. Information is as follows:  [facility] - [facility] [address] [place], [place] [address] P: [phone] F: [phone]

## 2022-01-09 NOTE — Telephone Encounter (Signed)
Pt states neurology referral cannot see her until September. She would like to know if she can get an urgent referral. Please advise.

## 2022-01-09 NOTE — ED Notes (Signed)
Pt in CT. EDPA speaking with family.

## 2022-01-10 ENCOUNTER — Other Ambulatory Visit: Payer: Self-pay | Admitting: Family Medicine

## 2022-01-10 DIAGNOSIS — I10 Essential (primary) hypertension: Secondary | ICD-10-CM

## 2022-01-10 DIAGNOSIS — E7800 Pure hypercholesterolemia, unspecified: Secondary | ICD-10-CM

## 2022-01-10 NOTE — Progress Notes (Signed)
NEUROLOGY CONSULTATION NOTE  Margaret Sims MRN: [id number] DOB: [birth year]  Referring provider: Alvester Chou, MD (ED referral) Primary care provider: Abbe Amsterdam, MD  Reason for consult:  possible complex migraine  Assessment/Plan:   Syncope - semiology seems consistent with vasovagal syncope while eating or cardiogenic as she reported palpitations.  However, given the urinary incontinence, will check EEG Migraine without aura, without status migrainosus, not intractable - semiology consistent with typical migraines but more frequent.  MRI of brain unremarkable.  Do not suspect GCA as these are her typical migraines.  Do not suspect aneurysm as semiology not consistent (not exertional) and no family history. Right upper extremity numbness - may be carpal tunnel syndrome vs cervical radiculopathy.   Right foot numbness - unclear etiology - may be radiculopathy but also has symptoms of polyneuropathy  Migraine prevention:  start nortriptyline 10mg  at bedtime.  We can increase dose to 25mg  in 4 weeks if needed Migraine rescue:  Tylenol Routine EEG NCV-EMG right upper and lower extremities Advised to wear right wrist splint to evaluate for resolution/improvement of symptoms. Limit use of pain relievers to no more than 2 days out of week to prevent risk of rebound or medication-overuse headache. Keep headache diary Follow up 4 months.    Subjective:  Margaret Sims is a 61 year old female with HTN, DM II and HLD who presents for possible complex migraine.  History supplemented by ED notes.  CT and MRI of brain personally reviewed.  On 12/16/2021, she had a syncopal episode while eating out with family in a restaurant.  She remembered feeling hot and dizzy and then lost consciousness for 10-15 seconds.  Reportedly her eyes crossed and mouth drooped open and started leaning onto her sister.  It then happened a second time.  She was diaphoretic and had urinary incontinence but no  headache, shaking, tongue biting or confusion after the event.  She did note palpitations.  No history of seizures.  She was evaluated in the ED at [facility].  Workup, including EKG, was unremarkable.  She was discharged with schedule for outpatient ZIO patch (she had it performed but results not yet available).  Following incident, she reported episodes of palpitations.  She also noted numbness and tingling in the right wrist radiating into the thumb and index finger.  No neck pain or weakness.  She also noted numbness of the entire right foot (mostly toes).  She was evaluated by neurology at [facility] in 2020 for numbness in her toes and fingers.  It appears that she may have had a NCV-EMG but she does not remember and no results are in the chart.  She reports headaches from time to time but they have become more frequent since passing out.  She describes a moderate to severe left retro-orbital throbbing headache sometimes may radiate to the bridge of her nose.  Associated diaphoresis and feeling hot.  May have mild nausea.  No associated photophobia, phonophobia or visual disturbance.  They typically last several hours.  Treats with Tylenol.  They have occurred at least 1 or 2 a week.  On 01/09/2022, she was seen in the ED at [facility] for headache and right sided paresthesias.  No chest pain.  Troponins negative but EKG this time showed nonspecific T wave inversion in lead III without other acute findings.  ACS not suspected.  CT head unremarkable.  MRI of brain showed chronic small vessel ischemic changes but no acute intracranial abnormality.  Possible complex migraine was suspected.     PAST MEDICAL HISTORY: Past Medical History:  Diagnosis Date   Acid reflux    Diabetes type 2, controlled (HCC) 04/16/2016   High cholesterol    Hypertension     PAST SURGICAL HISTORY: Past Surgical History:  Procedure Laterality Date   CESAREAN SECTION      MEDICATIONS: Current  Outpatient Medications on File Prior to Visit  Medication Sig Dispense Refill   aspirin 81 MG tablet Take 81 mg by mouth daily.     atorvastatin (LIPITOR) 40 MG tablet TAKE ONE (1) TABLET BY MOUTH EACH DAY 90 tablet 1   lisinopril (ZESTRIL) 40 MG tablet TAKE ONE (1) TABLET BY MOUTH EACH DAY 90 tablet 1   omeprazole (PRILOSEC) 40 MG capsule TAKE 1 CAPSULE BY MOUTH DAILY (Patient taking differently: Take 40 mg by mouth daily.) 90 capsule 3   No current facility-administered medications on file prior to visit.    ALLERGIES: No Known Allergies  FAMILY HISTORY: No family history on file.  Objective:  Blood pressure 136/75, pulse 96, height 5\' 7"  (1.702 m), weight 164 lb (74.4 kg), SpO2 99 %. General: No acute distress.  Patient appears well-groomed.   Head:  Normocephalic/atraumatic Eyes:  fundi examined but not visualized Neck: supple, no paraspinal tenderness, full range of motion Back: No paraspinal tenderness Heart: regular rate and rhythm Lungs: Clear to auscultation bilaterally. Vascular: No carotid bruits. Neurological Exam: Mental status: alert and oriented to person, place, and time, recent and remote memory intact, fund of knowledge intact, attention and concentration intact, speech fluent and not dysarthric, language intact. Cranial nerves: CN I: not tested CN II: pupils equal, round and reactive to light, visual fields intact CN III, IV, VI:  full range of motion, no nystagmus, no ptosis CN V: facial sensation intact. CN VII: upper and lower face symmetric CN VIII: hearing intact CN IX, X: gag intact, uvula midline CN XI: sternocleidomastoid and trapezius muscles intact CN XII: tongue midline Bulk & Tone: normal, no fasciculations. Motor:  muscle strength 5/5 throughout Sensation:  light touch sensation reduced in right thumb and index finger, pinprick, temperature and vibratory sensation intact.  Positive Tinel's on right wrist. Deep Tendon Reflexes:  2+ throughout,   toes downgoing.   Finger to nose testing:  Without dysmetria.   Heel to shin:  Without dysmetria.   Gait:  Normal station and stride.  Romberg negative.    Thank you for allowing me to take part in the care of this patient.  , DO  CC:  Shon Millet, MD

## 2022-01-10 NOTE — Progress Notes (Deleted)
[facility] [address], [address] [place], [place] [address] [phone]  Date:  01/17/2022   Name:  Margaret Sims   DOB:  04/28/1961   MRN:  [id number]  PCP:  Pearline Cables, MD    Chief Complaint: No chief complaint on file.   History of Present Illness:  Margaret Sims is a 61 y.o. very pleasant female patient who presents with the following:  Patient seen today for follow-up-history of diet-controlled diabetes, diabetic neuropathy, hypertension, dyslipidemia, smoking but recently quit Last routine visit with myself was in April  Since her last visit she was seen in the ER on 6/11 with concern of syncope-she was evaluated in the ER at [facility] and was released to home.  They did order a Zio patch-it looks like [facility], I cannot see this report  Carotid ultrasound also completed-also cannot see this result  She was seen here by our nurse practitioner Ladona Ridgel back for follow-up, but then appeared in the emergency room on 7/5: She had been urgent care earlier that day, EKG changes were noted.  She was referred to the ER where she had negative cardiac enzymes.  She had a CT and MRI of her brain both of which were negative Suspicion for possible complex migraine  Patient Active Problem List   Diagnosis Date Noted   Type 2 diabetes mellitus with diabetic neuropathy, unspecified (HCC) 05/01/2020   Essential hypertension 02/19/2016   Dyslipidemia 02/19/2016   Tobacco use disorder 02/19/2016    Past Medical History:  Diagnosis Date   Acid reflux    Diabetes type 2, controlled (HCC) 04/16/2016   High cholesterol    Hypertension     Past Surgical History:  Procedure Laterality Date   CESAREAN SECTION      Social History   Tobacco Use   Smoking status: Every Day    Packs/day: 0.50    Types: Cigarettes   Smokeless tobacco: Never  Vaping Use   Vaping Use: Never used  Substance Use  Topics   Alcohol use: Yes    Comment: 1-2 times per week   Drug use: Never    No family history on file.  No Known Allergies  Medication list has been reviewed and updated.  Current Outpatient Medications on File Prior to Visit  Medication Sig Dispense Refill   aspirin 81 MG tablet Take 81 mg by mouth daily.     atorvastatin (LIPITOR) 40 MG tablet TAKE ONE (1) TABLET BY MOUTH EACH DAY 90 tablet 1   lisinopril (ZESTRIL) 40 MG tablet TAKE ONE (1) TABLET BY MOUTH EACH DAY 90 tablet 1   omeprazole (PRILOSEC) 40 MG capsule TAKE 1 CAPSULE BY MOUTH DAILY (Patient taking differently: Take 40 mg by mouth daily.) 90 capsule 3   No current facility-administered medications on file prior to visit.    Review of Systems:  As per HPI- otherwise negative.   Physical Examination: There were no vitals filed for this visit. There were no vitals filed for this visit. There is no height or weight on file to calculate BMI. Ideal Body Weight:   GEN: no acute distress. HEENT: Atraumatic, Normocephalic.  Ears and Nose: No external deformity. CV: RRR, No M/G/R. No JVD. No thrill. No extra heart sounds. PULM: CTA B, no wheezes, crackles, rhonchi. No retractions. No resp. distress. No accessory muscle use. ABD: S, NT, ND, +BS. No rebound. No HSM. EXTR:  No c/c/e PSYCH: Normally interactive. Conversant.    Assessment and Plan: ***  Signed Lamar Blinks, MD

## 2022-01-11 ENCOUNTER — Ambulatory Visit: Admitting: Neurology

## 2022-01-11 ENCOUNTER — Encounter: Payer: Self-pay | Admitting: Neurology

## 2022-01-11 VITALS — BP 136/75 | HR 96 | Ht 67.0 in | Wt 164.0 lb

## 2022-01-11 DIAGNOSIS — R200 Anesthesia of skin: Secondary | ICD-10-CM

## 2022-01-11 DIAGNOSIS — R55 Syncope and collapse: Secondary | ICD-10-CM | POA: Diagnosis not present

## 2022-01-11 DIAGNOSIS — R202 Paresthesia of skin: Secondary | ICD-10-CM | POA: Diagnosis not present

## 2022-01-11 DIAGNOSIS — G43009 Migraine without aura, not intractable, without status migrainosus: Secondary | ICD-10-CM | POA: Diagnosis not present

## 2022-01-11 MED ORDER — NORTRIPTYLINE HCL 10 MG PO CAPS: 10.0000 mg | ORAL_CAPSULE | Freq: Every day | ORAL | 5 refills | Status: DC

## 2022-01-11 NOTE — Patient Instructions (Incomplete)
It was good to see you again today, I am sorry you have been struggling to much recently

## 2022-01-11 NOTE — Progress Notes (Unsigned)
[facility] at [facility] [address] [place], [place] [address] [phone]  Date:  01/14/2022   Name:  Margaret Sims   DOB:  Nov 06, 1960   MRN:  [id number]  PCP:  Pearline Cables, MD    Chief Complaint: Numbness (Numbness and tingling in arm and hand. Seen by Neuro on 01/11/22: started Started Nortriptyline 10mg  QHS. Nerve study and EEG to be ordered, also advised to wear splint. )   History of Present Illness:  Margaret Sims is a 61 y.o. very pleasant female patient who presents with the following:  Patient seen today for follow-up History of diet-controlled diabetes with neuropathy, hypertension, dyslipidemia, smoking-recently quit  She had covid just over a year ago; she feels that her health has not been quite the same since she was sick with COVID  She has struggled recently with some unusual symptoms Most recently saw her in April, at that time she was doing fine but suffering from insomnia, we had her try trazodone- she never ended up taking this however- she was concerned about side effects  Lab Results  Component Value Date   HGBA1C 6.6 (H) 10/29/2021    On 6/11 she had an apparent syncopal episode, her family took her to the emergency room at [facility]: Margaret Sims is a 61 y.o. female with PMHx of T2DM, HTN and hypercholesterolemia who presents to the ED after a syncopal event that occurred today. She states that she was eating dinner at [employer] with her family. She ordered soup and drank a couple of sips of her drink. She remembers feeling hot and dizzy. Her family noticed that her eyes crossed and her mouth drooped open. She started leaning onto her sister. Her family was calling out her name and she was not responding. This lasted for about 10-15 seconds. She then came around and asked what happened. Her family told her that she lost consciousness. She the preceded to lose consciousness a  second time. This episode lasted a little longer. Her family reports that she was diaphoretic and urinated on herself, but she was not shaking. When she came around, she was sitting upright and her daughter was patting her back. The patient does not recall passing out. Currently, she reports that she feels fine. [place]. She denies chest pain, headache, weakness, shortness of breath, leg swelling, or any other issues at this time.   -Symptoms thought due to a vagal reaction or other syncope.  EKG, troponin okay.  Negative head CT.  She was released to home  My partner [employer], nurse practitioner saw her in the office on 6/15 She had a [facility] patch [facility] per [facility] on 6/16  She then went to urgent care on 7/5, they referred her to the emergency department: Patient seen in ED with complaint of left-sided headache located behind her left eye, intermittent for a week, also reporting some paresthesias in her right hand and fingers which been ongoing for about 2 months.  She was seen at an urgent care originally and had an EKG where there was concern for T wave inversion in lead III and she was sent to the ED.  She denies that she ever had any chest pain, or any coronary history.  Her EKG here shows a normal sinus rhythm with T wave inversion that is nonspecific in lead III,  no other acute ischemic findings.  Her delta troponins were negative.  Given that she has been chest pain-free have a low suspicion for ACS at this time.  She did have some intermittent hypertension but was complaining of a headache in the ED.  She had a benign neurological exam.  She does not have photophobia or nuchal rigidity.  She is afebrile with no leukocytosis, I have a low suspicion for meningitis.  Her CT brain and MRI brain are personally reviewed, agree with radiologist interpretation, no acute lesions.  I have a low suspicion for subarachnoid hemorrhage.  Doubt MS  (this is not optic neuritis clinically), or giant cell arteritis.  She does not have temporal tenderness. 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  We will reassess her clinical symptoms after the medications, if she is feeling improved, anticipate she can be discharged with neurology follow-up. [MT]  2245 Patient had a cardiac [facility] per my review of care everywhere external records, with an echocardiogram 1 month ago in June which was unremarkable.  This was in the setting of an episode of syncope reported.  She was also set up a Zio patch and cardiac monitoring by their facility. [MT]  2311 Headache is significantly improved after medications.  Stable for discharge home in the company of her husband. [MT]   She was seen by neurology last week- she started on nortriptyline  This seemed to do well with this medication for a couple of days but last night she did not sleep well She has been feeling "just strange and dizzy" since this all started She has noted some palpitations esp when she wakes up at night  Her nerve conduction studies are scheduled but EEG is not yet scheduled   She is going to [place] next week- she will not be driving however  I called [facility] about her [facility], did not have result back just yet. She denies any chest pain or shortness of breath No further syncope or presyncope Patient Active Problem List   Diagnosis Date Noted   Type 2 diabetes mellitus with diabetic neuropathy, unspecified (HCC) 05/01/2020   Essential hypertension 02/19/2016   Dyslipidemia 02/19/2016   Tobacco use disorder 02/19/2016    Past Medical History:  Diagnosis Date   Acid reflux    Diabetes type 2, controlled (HCC) 04/16/2016   High cholesterol    Hypertension     Past Surgical History:  Procedure Laterality Date   CESAREAN SECTION      Social History    Tobacco Use   Smoking status: Former    Packs/day: 0.50    Types: Cigarettes    Quit date: 09/05/2021    Years since quitting: 0.3   Smokeless tobacco: Never  Vaping Use   Vaping Use: Never used  Substance Use Topics   Alcohol use: Yes    Comment: 1-2 times per week   Drug use: Never    Family History  Problem Relation Age of Onset   Stroke Maternal Grandmother     No Known Allergies  Medication list has been reviewed and updated.  Current Outpatient Medications on File Prior to Visit  Medication Sig Dispense Refill   aspirin 81 MG tablet Take 81 mg by mouth daily.     atorvastatin (LIPITOR) 40 MG tablet TAKE ONE (1) TABLET BY MOUTH EACH DAY 90 tablet 1  lisinopril (ZESTRIL) 40 MG tablet TAKE ONE (1) TABLET BY MOUTH EACH DAY 90 tablet 1   nortriptyline (PAMELOR) 10 MG capsule Take 1 capsule (10 mg total) by mouth at bedtime. 30 capsule 5   omeprazole (PRILOSEC) 40 MG capsule TAKE 1 CAPSULE BY MOUTH DAILY (Patient taking differently: Take 40 mg by mouth daily.) 90 capsule 3   No current facility-administered medications on file prior to visit.    Review of Systems:  As per HPI- otherwise negative.    Physical Examination: Vitals:   01/14/22 1426  BP: 114/60  Pulse: (!) 115  Resp: 18  Temp: 98.5 F (36.9 C)  SpO2: 96%   Vitals:   01/14/22 1426  Weight: 164 lb (74.4 kg)  Height: 5\' 7"  (1.702 m)   Body mass index is 25.69 kg/m. Ideal Body Weight: Weight in (lb) to have BMI = 25: 159.3  GEN: no acute distress.  Normal weight, looks well HEENT: Atraumatic, Normocephalic.  Ears and Nose: No external deformity. CV: RRR, No M/G/R. No JVD. No thrill. No extra heart sounds. PULM: CTA B, no wheezes, crackles, rhonchi. No retractions. No resp. distress. No accessory muscle use. ABD: S, NT, ND, +BS. No rebound. No HSM. EXTR: No c/c/e PSYCH: Normally interactive. Conversant.   Noted some pulse irregularity (possible PVCs) on exam, however by the time EKG was  on this had resolved.  Noted sinus rhythm on EKG Rate 100 Possible old anterior infarct on EKG, appears similar to previous tracings  Assessment and Plan: Irregular heart beat - Plan: EKG 12-Lead, Ambulatory referral to Cardiology  Patient seen today for follow-up, she was recently seen in the emergency department twice about 3 weeks apart, first for syncope and a second time for possible EKG changes. She has already had an MRI of her head, has seen neurology Zio Patch has been completed, but result is not yet available Neurology suspects possible vasovagal syncope.  I have asked Janaiah to let me know as soon as she receives her Zio patch result.  Went ahead and placed referral for her to see cardiology with [facility], would like her to be seen this week ideally I advised her it would be best not to drive just in case she might have another syncopal episode until this issue is resolved Signed Lamar Blinks, MD

## 2022-01-11 NOTE — Patient Instructions (Addendum)
  Start nortriptyline 10mg  at bedtime.  Contact in 4 weeks with update and we can increase dose if needed. Will order EEG Will order nerve study of the right arm and leg. Get a wrist splint to wear on right wrist at night and as much during day as possible Limit use of pain relievers to no more than 2 days out of the week.  These medications include acetaminophen, NSAIDs (ibuprofen/Advil/Motrin, naproxen/Aleve, triptans (Imitrex/sumatriptan), Excedrin, and narcotics.  This will help reduce risk of rebound headaches. Be aware of common food triggers:  - Caffeine:  coffee, black tea, cola, Mt. Dew  - Chocolate  - Dairy:  aged cheeses (brie, blue, cheddar, gouda, [place], provolone, [place], Swiss, etc), chocolate milk, buttermilk, sour cream, limit eggs and yogurt  - Nuts, peanut butter  - Alcohol  - Cereals/grains:  FRESH breads (fresh bagels, sourdough, doughnuts), yeast productions  - Processed/canned/aged/cured meats (pre-packaged deli meats, hotdogs)  - MSG/glutamate:  soy sauce, flavor enhancer, pickled/preserved/marinated foods  - Sweeteners:  aspartame (Equal, Nutrasweet).  Sugar and Splenda are okay  - Vegetables:  legumes (lima beans, lentils, snow peas, fava beans, pinto peans, peas, garbanzo beans), sauerkraut, onions, olives, pickles  - Fruit:  avocados, bananas, citrus fruit (orange, lemon, grapefruit), mango  - Other:  Frozen meals, macaroni and cheese Routine exercise Stay adequately hydrated (aim for 64 oz water daily) Keep headache diary Maintain proper stress management Maintain proper sleep hygiene Do not skip meals Consider supplements:  magnesium citrate 400mg  daily, riboflavin 400mg  daily, coenzyme Q10 100mg  three times daily. From my standpoint, you may go to [place].

## 2022-01-14 ENCOUNTER — Ambulatory Visit (INDEPENDENT_AMBULATORY_CARE_PROVIDER_SITE_OTHER): Admitting: Family Medicine

## 2022-01-14 VITALS — BP 114/60 | HR 115 | Temp 98.5°F | Resp 18 | Ht 67.0 in | Wt 164.0 lb

## 2022-01-14 DIAGNOSIS — R55 Syncope and collapse: Secondary | ICD-10-CM

## 2022-01-14 DIAGNOSIS — I499 Cardiac arrhythmia, unspecified: Secondary | ICD-10-CM | POA: Diagnosis not present

## 2022-01-14 DIAGNOSIS — Z09 Encounter for follow-up examination after completed treatment for conditions other than malignant neoplasm: Secondary | ICD-10-CM | POA: Diagnosis not present

## 2022-01-14 DIAGNOSIS — R002 Palpitations: Secondary | ICD-10-CM | POA: Diagnosis not present

## 2022-01-15 ENCOUNTER — Encounter: Payer: Self-pay | Admitting: Family Medicine

## 2022-01-16 ENCOUNTER — Encounter: Payer: Self-pay | Admitting: Family Medicine

## 2022-01-17 ENCOUNTER — Ambulatory Visit: Admitting: Cardiology

## 2022-01-17 ENCOUNTER — Ambulatory Visit: Admitting: Family Medicine

## 2022-01-17 ENCOUNTER — Encounter: Payer: Self-pay | Admitting: Cardiology

## 2022-01-17 ENCOUNTER — Ambulatory Visit (INDEPENDENT_AMBULATORY_CARE_PROVIDER_SITE_OTHER)

## 2022-01-17 VITALS — BP 140/92 | HR 122 | Ht 67.0 in | Wt 165.0 lb

## 2022-01-17 DIAGNOSIS — R55 Syncope and collapse: Secondary | ICD-10-CM

## 2022-01-17 DIAGNOSIS — E1140 Type 2 diabetes mellitus with diabetic neuropathy, unspecified: Secondary | ICD-10-CM

## 2022-01-17 DIAGNOSIS — F17200 Nicotine dependence, unspecified, uncomplicated: Secondary | ICD-10-CM

## 2022-01-17 DIAGNOSIS — I10 Essential (primary) hypertension: Secondary | ICD-10-CM

## 2022-01-17 DIAGNOSIS — E785 Hyperlipidemia, unspecified: Secondary | ICD-10-CM | POA: Diagnosis not present

## 2022-01-17 HISTORY — DX: Syncope and collapse: R55

## 2022-01-17 NOTE — Progress Notes (Signed)
Cardiology Consultation:    Date:  01/17/2022   ID:  MILIANNA Sims, DOB 04/23/1961, MRN [id number]  PCP:  Pearline Cables, MD  Cardiologist:  Gypsy Balsam, MD   Referring MD: Pearline Cables, MD   Chief Complaint  Patient presents with   Irregular Heart Beat   Loss of Consciousness    12/2021 episode    Dizziness    History of Present Illness:    Margaret Sims is a 61 y.o. female who is being seen today for the evaluation of  at the request of Copland, [name], MD. past medical history significant for essential hypertension, diabetes, reflux disease, she was referred to [place] because of episode of syncope.  She was in [employer].  She had some drink already and she was waiting for food.  Suddenly she started feeling not right started feeling some sensation in the belly sweatiness somewhat nausea and then she completely passed out some people hold her she was sitting in the chair so she did not fall on the ground because she was called by her friends and then they put her in an upright position and she passed out again she wet herself she did not bite her tongue.  After episodes of she felt fine meaning she knew where she was she knew what was going on she was taken to the hospital.  Quite extensive evaluation has been performed and nothing was identified that could explain her episodes.  Her primary care physician put Zio patch on her for 1 week however she did not have any symptomatology there is the time I do not have official report however note saying that there was no significant finding on the monitor patient tells me that since the time that she had episode of syncope she get 2 or 3 more episodes that she felt lightheaded and dizzy interestingly after her first episode of syncope she did have some palpitations and when she was being transported to the emergency room however there was no significant arrhythmia identified.  Otherwise she is doing well.  She recently  retired is trying to BL be more active.  Also recently she quit smoking.  There was some issue about inversion of T waves in on the EKG done in one of the emergency room.  However she denies have any chest pain tightness squeezing pressure burning chest.  Past Medical History:  Diagnosis Date   Acid reflux    Diabetes type 2, controlled (HCC) 04/16/2016   High cholesterol    Hypertension     Past Surgical History:  Procedure Laterality Date   CESAREAN SECTION      Current Medications: Current Meds  Medication Sig   aspirin 81 MG tablet Take 81 mg by mouth daily.   atorvastatin (LIPITOR) 40 MG tablet TAKE ONE (1) TABLET BY MOUTH EACH DAY (Patient taking differently: Take 40 mg by mouth daily.)   lisinopril (ZESTRIL) 40 MG tablet TAKE ONE (1) TABLET BY MOUTH EACH DAY (Patient taking differently: Take 40 mg by mouth daily.)   nortriptyline (PAMELOR) 10 MG capsule Take 1 capsule (10 mg total) by mouth at bedtime.   omeprazole (PRILOSEC) 40 MG capsule TAKE 1 CAPSULE BY MOUTH DAILY (Patient taking differently: Take 40 mg by mouth daily.)     Allergies:   Patient has no known allergies.   Social History   Socioeconomic History   Marital status: Married    Spouse name: Not on file   Number of  children: Not on file   Years of education: Not on file   Highest education level: Not on file  Occupational History   Not on file  Tobacco Use   Smoking status: Former    Packs/day: 0.50    Types: Cigarettes    Quit date: 09/05/2021    Years since quitting: 0.3   Smokeless tobacco: Never  Vaping Use   Vaping Use: Never used  Substance and Sexual Activity   Alcohol use: Yes    Comment: 1-2 times per week   Drug use: Never   Sexual activity: Yes    Birth control/protection: Post-menopausal  Other Topics Concern   Not on file  Social History Narrative   Right handed   Social Determinants of Health   Financial Resource Strain: Not on file  Food Insecurity: Not on file   Transportation Needs: Not on file  Physical Activity: Not on file  Stress: Not on file  Social Connections: Not on file     Family History: The patient's family history includes Stroke in her maternal grandmother. ROS:   Please see the history of present illness.    All 14 point review of systems negative except as described per history of present illness.  EKGs/Labs/Other Studies Reviewed:    The following studies were reviewed today: I did review record from the emergency room  EKG:  EKG is  ordered today.  The ekg ordered today demonstrates normal sinus rhythm, left axis deviation, voltage criteria for LVH.  Recent Labs: 01/09/2022: ALT 16; BUN 12; Creatinine, Ser 1.04; Hemoglobin 12.7; Platelets 224; Potassium 4.1; Sodium 141  Recent Lipid Panel    Component Value Date/Time   CHOL 185 10/29/2021 1450   TRIG 99.0 10/29/2021 1450   HDL 58.50 10/29/2021 1450   CHOLHDL 3 10/29/2021 1450   VLDL 19.8 10/29/2021 1450   LDLCALC 107 (H) 10/29/2021 1450    Physical Exam:    VS:  BP (!) 140/92   Pulse (!) 122   Ht 5\' 7"  (1.702 m)   Wt 165 lb (74.8 kg)   SpO2 97%   BMI 25.84 kg/m     Wt Readings from Last 3 Encounters:  01/17/22 165 lb (74.8 kg)  01/14/22 164 lb (74.4 kg)  01/11/22 164 lb (74.4 kg)     GEN:  Well nourished, well developed in no acute distress HEENT: Normal NECK: No JVD; No carotid bruits LYMPHATICS: No lymphadenopathy CARDIAC: RRR, no murmurs, no rubs, no gallops RESPIRATORY:  Clear to auscultation without rales, wheezing or rhonchi  ABDOMEN: Soft, non-tender, non-distended MUSCULOSKELETAL:  No edema; No deformity  SKIN: Warm and dry NEUROLOGIC:  Alert and oriented x 3 PSYCHIATRIC:  Normal affect   ASSESSMENT:    1. Syncope and collapse   2. Essential hypertension   3. Type 2 diabetes mellitus with diabetic neuropathy, without long-term current use of insulin (HCC)   4. Dyslipidemia   5. Tobacco use disorder    PLAN:    In order of  problems listed above:  Syncopal episode concerning she will monitor however she did not have any episodes after she finished wearing monitor she had 2 additional episodes.  Therefore, I think is of value of wearing monitor for 2 weeks more hopefully will be able to record EKG while she have another episode.  In the meantime from cardiac standpoint review I will ask her to have an echocardiogram done to look for any evidence of pathology the fact that she had left ventricle hypertrophy  on the EKG make me concerned about potentially having hypertrophic cardiomyopathy.  There was a question about potentially driving.  She did have prodromal symptoms when this sensation happened however I prefer to wait at least to have a EEG done that being scheduled by neurology Essential hypertension first visit in our office.  We will continue monitoring. Diabetes that being followed by internal medicine team I did review K PN which show hemoglobin A1c of 6.6.  We will continue present management. Dyslipidemia K PN show LDL of 107 HDL 58.  She is on Lipitor 40 in the future we will talk about potentially increasing the dose of the medication.   Medication Adjustments/Labs and Tests Ordered: Current medicines are reviewed at length with the patient today.  Concerns regarding medicines are outlined above.  Orders Placed This Encounter  Procedures   LONG TERM MONITOR (3-14 DAYS)   EKG 12-Lead   ECHOCARDIOGRAM COMPLETE   No orders of the defined types were placed in this encounter.   Signed, Georgeanna Lea, MD, [facility]. 01/17/2022 12:03 PM    [facility]

## 2022-01-17 NOTE — Patient Instructions (Signed)
Medication Instructions:  Your physician recommends that you continue on your current medications as directed. Please refer to the Current Medication list given to you today.  *If you need a refill on your cardiac medications before your next appointment, please call your pharmacy*   Lab Work: None If you have labs (blood work) drawn today and your tests are completely normal, you will receive your results only by: MyChart Message (if you have MyChart) OR A paper copy in the mail If you have any lab test that is abnormal or we need to change your treatment, we will call you to review the results.   Testing/Procedures: Your physician has requested that you have an echocardiogram. Echocardiography is a painless test that uses sound waves to create images of your heart. It provides your doctor with information about the size and shape of your heart and how well your heart's chambers and valves are working. This procedure takes approximately one hour. There are no restrictions for this procedure.    Follow-Up: At [facility], you and your health needs are our priority.  As part of our continuing mission to provide you with exceptional heart care, we have created designated Provider Care Teams.  These Care Teams include your primary Cardiologist (physician) and Advanced Practice Providers (APPs -  Physician Assistants and Nurse Practitioners) who all work together to provide you with the care you need, when you need it.  We recommend signing up for the patient portal called "MyChart".  Sign up information is provided on this After Visit Summary.  MyChart is used to connect with patients for Virtual Visits (Telemedicine).  Patients are able to view lab/test results, encounter notes, upcoming appointments, etc.  Non-urgent messages can be sent to your provider as well.   To learn more about what you can do with MyChart, go to [email or web address].    Your next appointment:   6 week(s)  The  format for your next appointment:   In Person  Provider:   Gypsy Balsam, MD    Other Instructions Margaret Sims- Long Term Monitor Instructions  Your physician has requested you wear a ZIO patch monitor for 14 days.  This is a single patch monitor. [employer] supplies one patch monitor per enrollment. Additional stickers are not available. Please do not apply patch if you will be having a Nuclear Stress Test,  Echocardiogram, Cardiac CT, MRI, or Chest Xray during the period you would be wearing the  monitor. The patch cannot be worn during these tests. You cannot remove and re-apply the  ZIO XT patch monitor.  Your ZIO patch monitor will be mailed 3 day USPS to your address on file. It may take 3-5 days  to receive your monitor after you have been enrolled.  Once you have received your monitor, please review the enclosed instructions. Your monitor  has already been registered assigning a specific monitor serial # to you.  Important Information About Sugar

## 2022-02-01 ENCOUNTER — Ambulatory Visit (HOSPITAL_COMMUNITY)
Admission: RE | Admit: 2022-02-01 | Discharge: 2022-02-01 | Disposition: A | Discharge status: Home / Self Care | Source: Ambulatory Visit | Attending: Neurology | Admitting: Neurology

## 2022-02-01 DIAGNOSIS — R55 Syncope and collapse: Secondary | ICD-10-CM | POA: Insufficient documentation

## 2022-02-01 NOTE — Progress Notes (Signed)
Outpatient EEG complete - results pending.  

## 2022-02-04 NOTE — Procedures (Signed)
ELECTROENCEPHALOGRAM REPORT  Date of Study: 02/01/2022  Patient's Name: Margaret Sims MRN: [id number] Date of Birth: 02-25-1961   Clinical History: 61 year old female with migraines who had episode of syncope  Medications: Nortriptyline 25mg  ASA 81mg  Lisinopril 40mg   Atorvastatin 40mg  Omeprazole  Technical Summary: A multichannel digital EEG recording measured by the international 10-20 system with electrodes applied with paste and impedances below 5000 ohms performed in our laboratory with EKG monitoring in an awake and drowsy patient.  Hyperventilation and photic stimulation were  performed.  The digital EEG was referentially recorded, reformatted, and digitally filtered in a variety of bipolar and referential montages for optimal display.    Description: The patient is awake and drowsy during the recording.  During maximal wakefulness, there is a symmetric, medium voltage 10 Hz posterior dominant rhythm that attenuates with eye opening.  The record is symmetric.  During drowsiness and sleep, there is an increase in theta slowing of the background.  Vertex waves and symmetric sleep spindles were seen.  Hyperventilation and photic stimulation did not elicit any abnormalities.  There were no epileptiform discharges or electrographic seizures seen.    EKG lead was unremarkable.  Impression: This awake and drowsy EEG is normal.    Clinical Correlation: A normal EEG does not exclude a clinical diagnosis of epilepsy.  If further clinical questions remain, prolonged EEG may be helpful.  Clinical correlation is advised.   , DO

## 2022-02-05 ENCOUNTER — Ambulatory Visit (HOSPITAL_BASED_OUTPATIENT_CLINIC_OR_DEPARTMENT_OTHER)
Admission: RE | Admit: 2022-02-05 | Discharge: 2022-02-05 | Disposition: A | Discharge status: Home / Self Care | Source: Ambulatory Visit | Attending: Cardiology | Admitting: Cardiology

## 2022-02-05 DIAGNOSIS — R55 Syncope and collapse: Secondary | ICD-10-CM | POA: Diagnosis not present

## 2022-02-05 NOTE — Progress Notes (Signed)
  Echocardiogram 2D Echocardiogram has been performed.  Margaret Sims F 02/05/2022, 9:35 AM

## 2022-02-06 LAB — ECHOCARDIOGRAM COMPLETE
AR max vel: 1.83 cm2
AV Area VTI: 2.05 cm2
AV Area mean vel: 1.67 cm2
AV Mean grad: 4 mmHg
AV Peak grad: 7.7 mmHg
Ao pk vel: 1.39 m/s
Area-P 1/2: 2.38 cm2
S' Lateral: 2.6 cm

## 2022-02-07 ENCOUNTER — Encounter: Payer: Self-pay | Admitting: Cardiology

## 2022-02-12 ENCOUNTER — Telehealth: Payer: Self-pay

## 2022-02-12 DIAGNOSIS — I472 Ventricular tachycardia, unspecified: Secondary | ICD-10-CM

## 2022-02-12 NOTE — Telephone Encounter (Signed)
Patient notified of results.

## 2022-02-12 NOTE — Telephone Encounter (Signed)
-----   Message from Georgeanna Lea, MD sent at 02/08/2022 12:33 PM EDT ----- She have ventricular tachycardia on monitor.  Please refer her to our EP team

## 2022-02-12 NOTE — Telephone Encounter (Signed)
Patient notified of results and recommendations and agreed with plan. Referral sent with instructions to call her at [phone]

## 2022-02-12 NOTE — Telephone Encounter (Signed)
-----   Message from Georgeanna Lea, MD sent at 02/08/2022  9:53 AM EDT ----- Echocardiogram showed normal left ventricle ejection fraction, overall looks good

## 2022-02-21 ENCOUNTER — Encounter: Payer: Self-pay | Admitting: Cardiology

## 2022-02-21 ENCOUNTER — Ambulatory Visit: Admitting: Cardiology

## 2022-02-21 VITALS — BP 130/82 | HR 90 | Ht 67.0 in | Wt 171.0 lb

## 2022-02-21 DIAGNOSIS — E1140 Type 2 diabetes mellitus with diabetic neuropathy, unspecified: Secondary | ICD-10-CM

## 2022-02-21 DIAGNOSIS — I10 Essential (primary) hypertension: Secondary | ICD-10-CM

## 2022-02-21 DIAGNOSIS — R079 Chest pain, unspecified: Secondary | ICD-10-CM

## 2022-02-21 DIAGNOSIS — R55 Syncope and collapse: Secondary | ICD-10-CM | POA: Diagnosis not present

## 2022-02-21 DIAGNOSIS — E785 Hyperlipidemia, unspecified: Secondary | ICD-10-CM

## 2022-02-21 DIAGNOSIS — I472 Ventricular tachycardia, unspecified: Secondary | ICD-10-CM

## 2022-02-21 HISTORY — DX: Ventricular tachycardia, unspecified: I47.20

## 2022-02-21 NOTE — Progress Notes (Signed)
Cardiology Office Note:    Date:  02/21/2022   ID:  HAYLO FAKE, DOB 1961-02-25, MRN [id number]  PCP:  Pearline Cables, MD  Cardiologist:  Gypsy Balsam, MD    Referring MD: Pearline Cables, MD   No chief complaint on file.   History of Present Illness:    Margaret Sims is a 61 y.o. female who was sent to [place] because of episode of syncope from description she gave me at that time look like probably vasovagal she did have monitor placed after that which was unrevealing but She was still having episode of dizziness not to the point of passing out that is why I put another monitor on her.  That monitor showed short run of very fast ventricular tachycardia.  That was apparently asymptomatic.  She did have echocardiogram done in the meantime which showed preserved ejection fraction but mild degree of left ventricular hypertrophy.  She comes to talk about that.  Since have seen her last time she had 2 episode of dizziness.  First episode happened when she was outside standing in a hot environment and again she did have prodromal symptoms she was able to sit down drink some extra water she went to the car to cool down and all symptoms subsided.  It does not look like a rhythmic dizziness and arrhythmic syncope.  Otherwise she is doing fine.  Still trying to exercise and doing well  Past Medical History:  Diagnosis Date   Acid reflux    Diabetes type 2, controlled (HCC) 04/16/2016   High cholesterol    Hypertension     Past Surgical History:  Procedure Laterality Date   CESAREAN SECTION      Current Medications: Current Meds  Medication Sig   aspirin 81 MG tablet Take 81 mg by mouth daily.   atorvastatin (LIPITOR) 40 MG tablet TAKE ONE (1) TABLET BY MOUTH EACH DAY (Patient taking differently: Take 40 mg by mouth daily.)   lisinopril (ZESTRIL) 40 MG tablet TAKE ONE (1) TABLET BY MOUTH EACH DAY (Patient taking differently: Take 40 mg by mouth daily.)   nortriptyline (PAMELOR)  10 MG capsule Take 1 capsule (10 mg total) by mouth at bedtime.   omeprazole (PRILOSEC) 40 MG capsule TAKE 1 CAPSULE BY MOUTH DAILY (Patient taking differently: Take 40 mg by mouth daily.)     Allergies:   Patient has no known allergies.   Social History   Socioeconomic History   Marital status: Married    Spouse name: Not on file   Number of children: Not on file   Years of education: Not on file   Highest education level: Not on file  Occupational History   Not on file  Tobacco Use   Smoking status: Former    Packs/day: 0.50    Types: Cigarettes    Quit date: 09/05/2021    Years since quitting: 0.4   Smokeless tobacco: Never  Vaping Use   Vaping Use: Never used  Substance and Sexual Activity   Alcohol use: Yes    Comment: 1-2 times per week   Drug use: Never   Sexual activity: Yes    Birth control/protection: Post-menopausal  Other Topics Concern   Not on file  Social History Narrative   Right handed   Social Determinants of Health   Financial Resource Strain: Not on file  Food Insecurity: Not on file  Transportation Needs: Not on file  Physical Activity: Not on file  Stress: Not on file  Social Connections: Not on file     Family History: The patient's family history includes Stroke in her maternal grandmother. ROS:   Please see the history of present illness.    All 14 point review of systems negative except as described per history of present illness  EKGs/Labs/Other Studies Reviewed:      Recent Labs: 01/09/2022: ALT 16; BUN 12; Creatinine, Ser 1.04; Hemoglobin 12.7; Platelets 224; Potassium 4.1; Sodium 141  Recent Lipid Panel    Component Value Date/Time   CHOL 185 10/29/2021 1450   TRIG 99.0 10/29/2021 1450   HDL 58.50 10/29/2021 1450   CHOLHDL 3 10/29/2021 1450   VLDL 19.8 10/29/2021 1450   LDLCALC 107 (H) 10/29/2021 1450    Physical Exam:    VS:  BP 130/82 (BP Location: Left Arm, Patient Position: Sitting)   Pulse 90   Ht 5\' 7"  (1.702 m)    Wt 171 lb (77.6 kg)   SpO2 97%   BMI 26.78 kg/m     Wt Readings from Last 3 Encounters:  02/21/22 171 lb (77.6 kg)  01/17/22 165 lb (74.8 kg)  01/14/22 164 lb (74.4 kg)     GEN:  Well nourished, well developed in no acute distress HEENT: Normal NECK: No JVD; No carotid bruits LYMPHATICS: No lymphadenopathy CARDIAC: RRR, no murmurs, no rubs, no gallops RESPIRATORY:  Clear to auscultation without rales, wheezing or rhonchi  ABDOMEN: Soft, non-tender, non-distended MUSCULOSKELETAL:  No edema; No deformity  SKIN: Warm and dry LOWER EXTREMITIES: no swelling NEUROLOGIC:  Alert and oriented x 3 PSYCHIATRIC:  Normal affect   ASSESSMENT:    1. Essential hypertension   2. Ventricular tachycardia (HCC)   3. Type 2 diabetes mellitus with diabetic neuropathy, without long-term current use of insulin (HCC)   4. Syncope and collapse   5. Dyslipidemia    PLAN:    In order of problems listed above:  Ventricle tachycardia surprising discovery on the monitor.  Fast and concerning however the way that she describes episode of dizziness and syncope does not look like an arrhythmic syncope.  Echocardiogram showed preserved ejection fraction, I will schedule her to have exercise Cardiolite to see if she got any inducible ischemia as well as to observe behavior of the arrhythmia with exercises.  She is already scheduled to see our EP colleague Essential hypertension blood pressure seems to be well controlled continue present management. Dyslipidemia she is on Lipitor I do have data from April 2023 with LDL of 107   Medication Adjustments/Labs and Tests Ordered: Current medicines are reviewed at length with the patient today.  Concerns regarding medicines are outlined above.  No orders of the defined types were placed in this encounter.  Medication changes: No orders of the defined types were placed in this encounter.   Signed, May 2023, MD, [facility] 02/21/2022 1:21 PM    [facility]

## 2022-02-21 NOTE — Addendum Note (Signed)
Addended by: Baldo Ash D on: 02/21/2022 01:33 PM   Modules accepted: Orders

## 2022-02-21 NOTE — Patient Instructions (Signed)
Medication Instructions:  Your physician recommends that you continue on your current medications as directed. Please refer to the Current Medication list given to you today.  *If you need a refill on your cardiac medications before your next appointment, please call your pharmacy*   Lab Work: None Ordered If you have labs (blood work) drawn today and your tests are completely normal, you will receive your results only by: MyChart Message (if you have MyChart) OR A paper copy in the mail If you have any lab test that is abnormal or we need to change your treatment, we will call you to review the results.   Testing/Procedures: Your physician has requested that you have a Exercise Cardiolyte. For further information please visit [email or web address] Please follow instruction sheet, as given.  The test will take approximately 3 to 4 hours to complete; you may bring reading material.  If someone comes with you to your appointment, they will need to remain in the main lobby due to limited space in the testing area.   How to prepare for your Myocardial Perfusion Test: Do not eat or drink 3 hours prior to your test, except you may have water. Do not consume products containing caffeine (regular or decaffeinated) 12 hours prior to your test. (ex: coffee, chocolate, sodas, tea). Do bring a list of your current medications with you.  If not listed below, you may take your medications as normal. Do wear comfortable clothes (no dresses or overalls) and walking shoes, tennis shoes preferred (No heels or open toe shoes are allowed). Do NOT wear cologne, perfume, aftershave, or lotions (deodorant is allowed). If these instructions are not followed, your test will have to be rescheduled.     Follow-Up: At [facility], you and your health needs are our priority.  As part of our continuing mission to provide you with exceptional heart care, we have created designated Provider Care Teams.  These Care  Teams include your primary Cardiologist (physician) and Advanced Practice Providers (APPs -  Physician Assistants and Nurse Practitioners) who all work together to provide you with the care you need, when you need it.  We recommend signing up for the patient portal called "MyChart".  Sign up information is provided on this After Visit Summary.  MyChart is used to connect with patients for Virtual Visits (Telemedicine).  Patients are able to view lab/test results, encounter notes, upcoming appointments, etc.  Non-urgent messages can be sent to your provider as well.   To learn more about what you can do with MyChart, go to [email or web address].    Your next appointment:   3 month(s)  The format for your next appointment:   In Person  Provider:   Gypsy Balsam, MD    Other Instructions NA

## 2022-02-28 NOTE — Addendum Note (Signed)
Addended by: Gypsy Balsam on: 02/28/2022 09:10 AM   Modules accepted: Orders

## 2022-03-05 ENCOUNTER — Telehealth (HOSPITAL_COMMUNITY): Payer: Self-pay | Admitting: *Deleted

## 2022-03-05 NOTE — Telephone Encounter (Signed)
Close encounter 

## 2022-03-06 ENCOUNTER — Ambulatory Visit (HOSPITAL_COMMUNITY)
Admission: RE | Admit: 2022-03-06 | Discharge: 2022-03-06 | Disposition: A | Discharge status: Home / Self Care | Source: Ambulatory Visit | Attending: Internal Medicine | Admitting: Internal Medicine

## 2022-03-06 DIAGNOSIS — I472 Ventricular tachycardia, unspecified: Secondary | ICD-10-CM | POA: Diagnosis present

## 2022-03-06 DIAGNOSIS — R079 Chest pain, unspecified: Secondary | ICD-10-CM | POA: Diagnosis not present

## 2022-03-06 LAB — MYOCARDIAL PERFUSION IMAGING
Estimated workload: 9.3
Exercise duration (min): 7 min
Exercise duration (sec): 30 s
LV dias vol: 96 mL (ref 46–106)
LV sys vol: 52 mL
MPHR: 158 {beats}/min
Nuc Stress EF: 46 %
Peak HR: 162 {beats}/min
Percent HR: 102 %
Rest HR: 65 {beats}/min
Rest Nuclear Isotope Dose: 11 mCi
SDS: 1
SRS: 1
SSS: 2
Stress Nuclear Isotope Dose: 32 mCi
TID: 0.99

## 2022-03-06 MED ORDER — TECHNETIUM TC 99M TETROFOSMIN IV KIT
11.0000 | PACK | Freq: Once | INTRAVENOUS | Status: AC | PRN
  Administered 2022-03-06: 11 via INTRAVENOUS

## 2022-03-06 MED ORDER — TECHNETIUM TC 99M TETROFOSMIN IV KIT
32.0000 | PACK | Freq: Once | INTRAVENOUS | Status: AC | PRN
  Administered 2022-03-06: 32 via INTRAVENOUS

## 2022-03-13 ENCOUNTER — Telehealth: Payer: Self-pay

## 2022-03-13 NOTE — Telephone Encounter (Signed)
Patient notified of results.

## 2022-03-13 NOTE — Telephone Encounter (Signed)
-----   Message from Georgeanna Lea, MD sent at 03/11/2022  8:06 PM EDT ----- Stress test showing no evidence of ischemia

## 2022-04-01 ENCOUNTER — Ambulatory Visit: Admitting: Neurology

## 2022-04-01 ENCOUNTER — Encounter: Payer: Self-pay | Admitting: Cardiology

## 2022-04-01 ENCOUNTER — Ambulatory Visit: Attending: Cardiology | Admitting: Cardiology

## 2022-04-01 VITALS — BP 128/84 | HR 96 | Ht 67.0 in | Wt 178.0 lb

## 2022-04-01 DIAGNOSIS — R55 Syncope and collapse: Secondary | ICD-10-CM

## 2022-04-01 DIAGNOSIS — R202 Paresthesia of skin: Secondary | ICD-10-CM | POA: Diagnosis not present

## 2022-04-01 DIAGNOSIS — I472 Ventricular tachycardia, unspecified: Secondary | ICD-10-CM | POA: Diagnosis not present

## 2022-04-01 DIAGNOSIS — R200 Anesthesia of skin: Secondary | ICD-10-CM | POA: Diagnosis not present

## 2022-04-01 NOTE — Procedures (Signed)
[facility]  [address]  [place], [place] [address] Tel: [phone] Fax: [phone] Test Date:  04/01/2022  Patient: Margaret Sims DOB: 05-20-1961 Physician: Kai Levins, MD  Sex: Female Height: 5\' 7"  Ref Phys: Metta Clines, DO  ID#: [id number]   Technician:    History: This is a 61 year old female with right upper and lower limb paresthesia.  NCV & EMG Findings: Extensive electrodiagnostic evaluation of the right upper and lower limbs shows: Right sural and superficial fibular sensory responses of the lower limb are within normal limits. Right median and ulnar sensory responses of the upper limb are within normal limits. Right fibular (EDB) and tibial (AH) motor responses of the lower limb are within normal limits. Right median (APB) and ulnar (ADM) motor responses of the upper limb are within normal limits. Right H Reflex latency is within normal limits. There is no evidence of active or chronic motor axon loss changes affecting any of the tested muscles. Motor unit configuration and recruitment pattern is within normal limits.  Impression: This is a normal electrodiagnostic evaluation of the right upper and lower limbs. There is no evidence of polyneuropathy or right cervical (C5-C8) or lumbosacral (L3-S1) motor radiculopathy, and screening studies for right median or ulnar mononeuropathies are within normal limits. Specifically, there is no electrodiagnostic evidence of right carpal tunnel syndrome.    ___________________________ Kai Levins, MD    Nerve Conduction Studies Motor Nerve Results    Latency Amplitude F-Lat Segment Distance CV Comment  Site (ms) Norm (mV) Norm (ms)  (cm) (m/s) Norm   Right Fibular (EDB) Motor  Ankle 4.0  < 6.0 4.7  > 2.5        Bel fib head 11.2 - 4.5 -  Bel fib head-Ankle 30 42  > 40   Pop fossa 13.5 - 4.3 -  Pop fossa-Bel fib head 10 43 -   Right Median (APB) Motor  Wrist 2.5  < 4.0 10.5  > 5.0        Elbow 7.3  - 10.0 -  Elbow-Wrist 28 58  > 50   Right Tibial (AH) Motor  Ankle 4.0  < 6.0 8.6  > 4.0        Knee 13.1 - 6.5 -  Knee-Ankle 39 43  > 40   Right Ulnar (ADM) Motor  Wrist 1.55  < 3.1 9.9  > 7.0        Bel elbow 5.7 - 9.2 -  Bel elbow-Wrist 22.5 54  > 50   Ab elbow 7.5 - 9.0 -  Ab elbow-Bel elbow 10 56 -    Sensory Sites    Neg Peak Lat Amplitude (O-P) Segment Distance Velocity Comment  Site (ms) Norm (V) Norm  (cm) (ms)   Right Median Sensory  Wrist-Dig II 2.9  < 3.8 25  > 10 Wrist-Dig II 13    Right Median-Ulnar Palmar Sensory       Median  Palm-Wrist 1.63  < 2.2 31  > 10 Palm-Wrist 8         Ulnar  Palm-Wrist 1.73  < 2.2 33  > 5 Palm-Wrist 8    Right Superficial Fibular Sensory  14 cm-Ankle 2.2  < 4.6 4  > 3 14 cm-Ankle 14    Right Sural Sensory  Calf-Lat mall 2.9  < 4.6 5  > 3 Calf-Lat mall 14    Right Ulnar Sensory  Wrist-Dig V 2.7  < 3.2 30  > 5  Wrist-Dig V 11     H-Reflex Results    M-Lat H Lat H Neg Amp H-M Lat  Site (ms) (ms) Norm (mV) (ms)  Right Tibial H-Reflex  Pop fossa 5.3 31.0  < 35.0 - 25.7   Inter-Nerve Comparisons   Nerve 1 Value 1 Nerve 2 Value 2 Parameter Result Normal  Sensory Sites  R Median Palm-Wrist 1.63 ms R Ulnar Palm-Wrist 1.73 ms Peak Lat Diff 0.10 ms <0.30   Electromyography   Side Muscle Ins.Act Fibs Fasc Recrt Amp Dur Poly Activation Comment  Right Tib ant Nml Nml Nml Nml Nml Nml Nml Nml N/A  Right Gastroc MH Nml Nml Nml Nml Nml Nml Nml Nml N/A  Right FDL Nml Nml Nml Nml Nml Nml Nml Nml N/A  Right Vastus lat Nml Nml Nml Nml Nml Nml Nml Nml N/A  Right Biceps fem SH Nml Nml Nml Nml Nml Nml Nml Nml N/A  Right Gluteus med Nml Nml Nml Nml Nml Nml Nml Nml N/A  Right FDI Nml Nml Nml Nml Nml Nml Nml Nml N/A  Right EIP Nml Nml Nml Nml Nml Nml Nml Nml N/A  Right Pronator teres Nml Nml Nml Nml Nml Nml Nml Nml N/A  Right Biceps Nml Nml Nml Nml Nml Nml Nml Nml N/A  Right Triceps Nml Nml Nml Nml Nml Nml Nml Nml N/A  Right Deltoid Nml Nml Nml Nml  Nml Nml Nml Nml N/A      Waveforms:  Motor           Sensory             H-Reflex

## 2022-04-01 NOTE — Progress Notes (Signed)
Electrophysiology Office Note   Date:  04/01/2022   ID:  Margaret Sims, DOB 08-Mar-1961, MRN [id number]  PCP:  Darreld Mclean, MD  Cardiologist:  Agustin Cree Primary Electrophysiologist:  Tywanna Seifer Meredith Leeds, MD    Chief Complaint: syncope   History of Present Illness: Margaret Sims is a 61 y.o. female who is being seen today for the evaluation of syncope at the request of Park Liter, MD. Presenting today for electrophysiology evaluation.  She has a history significant for hyperlipidemia, hypertension, type 2 diabetes.  She presented to general cardiology with an episode of syncope.  She continues to have episodes of dizziness, but has not had syncope.  Monitor showed a short run of VT for which she was asymptomatic.  She had an echo that showed a normal ejection fraction Myoview without evidence of ischemia.  One of her episodes of syncope occurred while she was sitting down.  She was at a restaurant and had soup and salad.  She was waiting on her main course when she woke up on the ground.  She was told that she had 2 episodes of quite quickly.  She has had 2 other episodes of near syncope, one while driving on the way to church, and one while sitting watching TV.  These 4 episodes occurred in June/July.  She has had no further episodes since then.  She has felt well and has been able to do her daily activities.  Today, she denies symptoms of palpitations, chest pain, shortness of breath, orthopnea, PND, lower extremity edema, claudication, dizziness, presyncope, syncope, bleeding, or neurologic sequela. The patient is tolerating medications without difficulties.    Past Medical History:  Diagnosis Date   Acid reflux    Diabetes type 2, controlled ([place]) 04/16/2016   High cholesterol    Hypertension    Past Surgical History:  Procedure Laterality Date   CESAREAN SECTION       Current Outpatient Medications  Medication Sig Dispense Refill   aspirin 81 MG tablet  Take 81 mg by mouth daily.     atorvastatin (LIPITOR) 40 MG tablet TAKE ONE (1) TABLET BY MOUTH EACH DAY (Patient taking differently: Take 40 mg by mouth daily.) 90 tablet 1   lisinopril (ZESTRIL) 40 MG tablet TAKE ONE (1) TABLET BY MOUTH EACH DAY (Patient taking differently: Take 40 mg by mouth daily.) 90 tablet 1   omeprazole (PRILOSEC) 40 MG capsule TAKE 1 CAPSULE BY MOUTH DAILY (Patient taking differently: Take 40 mg by mouth daily.) 90 capsule 3   No current facility-administered medications for this visit.    Allergies:   Patient has no known allergies.   Social History:  The patient  reports that she quit smoking about 6 months ago. Her smoking use included cigarettes. She smoked an average of .5 packs per day. She has never used smokeless tobacco. She reports current alcohol use. She reports that she does not use drugs.   Family History:  The patient's family history includes Stroke in her maternal grandmother.    ROS:  Please see the history of present illness.   Otherwise, review of systems is positive for none.   All other systems are reviewed and negative.    PHYSICAL EXAM: VS:  BP 128/84   Pulse 96   Ht 5\' 7"  (1.702 m)   Wt 178 lb 0.6 oz (80.8 kg)   SpO2 96%   BMI 27.89 kg/m  , BMI Body mass index is 27.89 kg/m. GEN: Well  nourished, well developed, in no acute distress  HEENT: normal  Neck: no JVD, carotid bruits, or masses Cardiac: RRR; no murmurs, rubs, or gallops,no edema  Respiratory:  clear to auscultation bilaterally, normal work of breathing GI: soft, nontender, nondistended, + BS MS: no deformity or atrophy  Skin: warm and dry Neuro:  Strength and sensation are intact Psych: euthymic mood, full affect  EKG:  EKG is ordered today. Personal review of the ekg ordered shows sinus rhythm, LVH  Recent Labs: 01/09/2022: ALT 16; BUN 12; Creatinine, Ser 1.04; Hemoglobin 12.7; Platelets 224; Potassium 4.1; Sodium 141    Lipid Panel     Component Value Date/Time    CHOL 185 10/29/2021 1450   TRIG 99.0 10/29/2021 1450   HDL 58.50 10/29/2021 1450   CHOLHDL 3 10/29/2021 1450   VLDL 19.8 10/29/2021 1450   LDLCALC 107 (H) 10/29/2021 1450     Wt Readings from Last 3 Encounters:  04/01/22 178 lb 0.6 oz (80.8 kg)  03/06/22 171 lb (77.6 kg)  02/21/22 171 lb (77.6 kg)      Other studies Reviewed: Additional studies/ records that were reviewed today include: TTE 02/06/22  Review of the above records today demonstrates:   1. Left ventricular ejection fraction, by estimation, is 55 to 60%. The  left ventricle has normal function. The left ventricle has no regional  wall motion abnormalities. There is mild left ventricular hypertrophy.  Left ventricular diastolic parameters  are consistent with Grade I diastolic dysfunction (impaired relaxation).   2. Right ventricular systolic function is normal. The right ventricular  size is normal. There is normal pulmonary artery systolic pressure.   3. Right atrial size was mild to moderately dilated.   4. The mitral valve is normal in structure. No evidence of mitral valve  regurgitation. No evidence of mitral stenosis.   5. Tricuspid valve regurgitation is mild to moderate.   6. The aortic valve is normal in structure. Aortic valve regurgitation is  mild. No aortic stenosis is present.   7. The inferior vena cava is normal in size with greater than 50%  respiratory variability, suggesting right atrial pressure of 3 mmHg.   Cardiac monitor 02/08/2022 personally reviewed Ventricle tachycardia 1 episodes at rate of 266 bpm, Multiple episode of supraventricular tachycardia total number of 335 Total burden of supraventricular ectopy of 3.9% Triggered event showing APCs  Myoview 03/06/2022   Patient exercised for 7 minutes and 30 seconds on the Bruce protocol achieving 9.3 METS of activity.  Normal blood pressure response.   Baseline ECG showed sinus rhythm.  During stress, rare PACs noted.  No adverse  arrhythmias.   Perfusion study shows no significant defects at rest or stress.  No evidence of ischemia or infarction.   Ejection fraction calculated 46% with mild global hypokinesis.   Intermediate risk test secondary to mildly reduced ejection fraction of 46%.  ASSESSMENT AND PLAN:  1.  Syncope: Clear as to the cause of her syncope.  She did have a run of nonsustained VT, but with no ischemia and a normal echo, I do not think this is necessarily contributing.  She was also minimally symptomatic while wearing her monitor.  I told her that she would benefit from further monitoring with ILR implant.  At this point, as she has not had an episode over the last few months, she would like to hold off.  She Oluwatoni Rotunno call us back if she has further episodes and we Banjamin Stovall plan for ILR implant at that  time.  2.  Ventricular tachycardia: Echo with normal ejection fraction no ischemia on Myoview.  Continue monitoring  Case discussed with primary cardiology  Current medicines are reviewed at length with the patient today.   The patient does not have concerns regarding her medicines.  The following changes were made today:  none  Labs/ tests ordered today include:  Orders Placed This Encounter  Procedures   EKG 12-Lead     Disposition:   FU with Sable Knoles as needed  Signed, Rick Carruthers Meredith Leeds, MD  04/01/2022 3:38 PM     [facility] [address] [place] [address] [phone] (office) [phone] (fax)

## 2022-04-01 NOTE — Patient Instructions (Signed)
Medication Instructions:  Your physician recommends that you continue on your current medications as directed. Please refer to the Current Medication list given to you today.  *If you need a refill on your cardiac medications before your next appointment, please call your pharmacy*   Lab Work: None ordered   Testing/Procedures: None ordered   Follow-Up: At [facility], you and your health needs are our priority.  As part of our continuing mission to provide you with exceptional heart care, we have created designated Provider Care Teams.  These Care Teams include your primary Cardiologist (physician) and Advanced Practice Providers (APPs -  Physician Assistants and Nurse Practitioners) who all work together to provide you with the care you need, when you need it.  Your next appointment:   as  needed  The format for your next appointment:   In Person  Provider:   Will Camnitz, MD    Thank you for choosing [facility]!!   Darrill Vreeland, RN [phone]  Other Instructions   Important Information About Sugar           

## 2022-04-23 ENCOUNTER — Other Ambulatory Visit: Payer: Self-pay | Admitting: Family Medicine

## 2022-04-27 NOTE — Progress Notes (Unsigned)
[place] at [facility] [address], [place], [place] [address] [phone] [id number] [phone]  Date:  04/29/2022   Name:  Margaret Sims   DOB:  1961/02/19   MRN:  [id number]  PCP:  Darreld Mclean, MD    Chief Complaint: No chief complaint on file.   History of Present Illness:  Margaret Sims is a 61 y.o. very pleasant female patient who presents with the following:  Patient seen today for follow-up visit Most recent visit with myself was in July-Notes from that visit: History of diet-controlled diabetes with neuropathy, hypertension, dyslipidemia, smoking-recently quit She had covid just over a year ago; she feels that her health has not been quite the same since she was sick with COVID 6/11 she had an apparent syncopal episode, her family took her to the emergency room.  She went to urgent care on 7/5, complaint of unusual headache and paresthesias, she was again sent to the emergency room-was treated and released to home with an apparent migraine There was also concern about cardiac arrhythmia, she did a Zio patch per atrium with [place] She was seen by cardiology, Dr. Agustin Cree on 7/13 He asked for a 2-week heart monitor and echo-this revealed a short run of V. Tach Her echocardiogram showed mild LVH She did a treadmill test/Myoview and did generally okay, EF 46% with mild global hypokinesis  She ended up being seen by electrophysiology, Dr. Curt Bears on 9/25-recommended a loop recorder  Lab Results  Component Value Date   HGBA1C 6.6 (H) 10/29/2021   Eye exam Urine microalbumin is due Foot exam Flu shot Update A1c/labs Shingrix, pneumonia up-to-date Patient Active Problem List   Diagnosis Date Noted   Ventricular tachycardia ([place]) 02/21/2022   Syncope and collapse 01/17/2022   Type 2 diabetes mellitus with diabetic neuropathy, unspecified ([place]) 05/01/2020   Essential hypertension 02/19/2016   Dyslipidemia 02/19/2016   Tobacco  use disorder 02/19/2016    Past Medical History:  Diagnosis Date   Acid reflux    Diabetes type 2, controlled ([place]) 04/16/2016   High cholesterol    Hypertension     Past Surgical History:  Procedure Laterality Date   CESAREAN SECTION      Social History   Tobacco Use   Smoking status: Former    Packs/day: 0.50    Types: Cigarettes    Quit date: 09/05/2021    Years since quitting: 0.6   Smokeless tobacco: Never  Vaping Use   Vaping Use: Never used  Substance Use Topics   Alcohol use: Yes    Comment: 1-2 times per week   Drug use: Never    Family History  Problem Relation Age of Onset   Stroke Maternal Grandmother     No Known Allergies  Medication list has been reviewed and updated.  Current Outpatient Medications on File Prior to Visit  Medication Sig Dispense Refill   aspirin 81 MG tablet Take 81 mg by mouth daily.     atorvastatin (LIPITOR) 40 MG tablet TAKE ONE (1) TABLET BY MOUTH EACH DAY (Patient taking differently: Take 40 mg by mouth daily.) 90 tablet 1   lisinopril (ZESTRIL) 40 MG tablet TAKE ONE (1) TABLET BY MOUTH EACH DAY (Patient taking differently: Take 40 mg by mouth daily.) 90 tablet 1   omeprazole (PRILOSEC) 40 MG capsule Take 1 capsule (40 mg total) by mouth daily. 90 capsule 1   No current facility-administered medications on file prior to visit.  Review of Systems:  As per HPI- otherwise negative.   Physical Examination: There were no vitals filed for this visit. There were no vitals filed for this visit. There is no height or weight on file to calculate BMI. Ideal Body Weight:    GEN: no acute distress. HEENT: Atraumatic, Normocephalic.  Ears and Nose: No external deformity. CV: RRR, No M/G/R. No JVD. No thrill. No extra heart sounds. PULM: CTA B, no wheezes, crackles, rhonchi. No retractions. No resp. distress. No accessory muscle use. ABD: S, NT, ND, +BS. No rebound. No HSM. EXTR: No c/c/e PSYCH: Normally interactive.  Conversant.    Assessment and Plan: ***  Signed Lamar Blinks, MD

## 2022-04-27 NOTE — Patient Instructions (Incomplete)
It was great to see you again today, I will be in touch with your labs as soon as possible  And a dose of the updated COVID-vaccine, and RSV this fall if not done already Flu shot today Let's have you try taking a 1/2 tablet of atorvastatin daily for a week or so and see if this helps your joint pains

## 2022-04-29 ENCOUNTER — Ambulatory Visit: Admitting: Family Medicine

## 2022-04-29 VITALS — BP 136/80 | HR 91 | Temp 97.8°F | Resp 18 | Ht 67.0 in | Wt 182.2 lb

## 2022-04-29 DIAGNOSIS — E559 Vitamin D deficiency, unspecified: Secondary | ICD-10-CM

## 2022-04-29 DIAGNOSIS — M2550 Pain in unspecified joint: Secondary | ICD-10-CM | POA: Diagnosis not present

## 2022-04-29 DIAGNOSIS — Z122 Encounter for screening for malignant neoplasm of respiratory organs: Secondary | ICD-10-CM

## 2022-04-29 DIAGNOSIS — E1169 Type 2 diabetes mellitus with other specified complication: Secondary | ICD-10-CM | POA: Diagnosis not present

## 2022-04-29 DIAGNOSIS — M791 Myalgia, unspecified site: Secondary | ICD-10-CM

## 2022-04-29 DIAGNOSIS — R002 Palpitations: Secondary | ICD-10-CM

## 2022-04-29 DIAGNOSIS — E119 Type 2 diabetes mellitus without complications: Secondary | ICD-10-CM

## 2022-04-29 DIAGNOSIS — E785 Hyperlipidemia, unspecified: Secondary | ICD-10-CM | POA: Diagnosis not present

## 2022-04-29 DIAGNOSIS — R5383 Other fatigue: Secondary | ICD-10-CM | POA: Diagnosis not present

## 2022-04-29 DIAGNOSIS — I10 Essential (primary) hypertension: Secondary | ICD-10-CM

## 2022-04-29 DIAGNOSIS — Z23 Encounter for immunization: Secondary | ICD-10-CM | POA: Diagnosis not present

## 2022-04-30 ENCOUNTER — Encounter: Payer: Self-pay | Admitting: Family Medicine

## 2022-04-30 LAB — CK: Total CK: 84 U/L (ref 7–177)

## 2022-04-30 LAB — COMPREHENSIVE METABOLIC PANEL
ALT: 10 U/L (ref 0–35)
AST: 16 U/L (ref 0–37)
Albumin: 4.2 g/dL (ref 3.5–5.2)
Alkaline Phosphatase: 94 U/L (ref 39–117)
BUN: 13 mg/dL (ref 6–23)
CO2: 29 mEq/L (ref 19–32)
Calcium: 9.5 mg/dL (ref 8.4–10.5)
Chloride: 108 mEq/L (ref 96–112)
Creatinine, Ser: 1.1 mg/dL (ref 0.40–1.20)
GFR: 53.91 mL/min — ABNORMAL LOW (ref >60.00)
Glucose, Bld: 77 mg/dL (ref 70–99)
Potassium: 3.8 mEq/L (ref 3.5–5.1)
Sodium: 144 mEq/L (ref 135–145)
Total Bilirubin: 0.4 mg/dL (ref 0.2–1.2)
Total Protein: 6.5 g/dL (ref 6.0–8.3)

## 2022-04-30 LAB — LIPID PANEL
Cholesterol: 200 mg/dL (ref 0–200)
HDL: 47.9 mg/dL (ref >39.00)
LDL Cholesterol: 117 mg/dL — ABNORMAL HIGH (ref 0–99)
NonHDL: 152.37
Total CHOL/HDL Ratio: 4
Triglycerides: 177 mg/dL — ABNORMAL HIGH (ref 0.0–149.0)
VLDL: 35.4 mg/dL (ref 0.0–40.0)

## 2022-04-30 LAB — CBC
HCT: 39.8 % (ref 36.0–46.0)
Hemoglobin: 13 g/dL (ref 12.0–15.0)
MCHC: 32.6 g/dL (ref 30.0–36.0)
MCV: 95.7 fl (ref 78.0–100.0)
Platelets: 280 10*3/uL (ref 150.0–400.0)
RBC: 4.16 Mil/uL (ref 3.87–5.11)
RDW: 13.4 % (ref 11.5–15.5)
WBC: 7.3 10*3/uL (ref 4.0–10.5)

## 2022-04-30 LAB — VITAMIN B12: Vitamin B-12: 318 pg/mL (ref 211–911)

## 2022-04-30 LAB — MICROALBUMIN / CREATININE URINE RATIO
Creatinine,U: 191.4 mg/dL
Microalb Creat Ratio: 0.9 mg/g (ref 0.0–30.0)
Microalb, Ur: 1.7 mg/dL (ref 0.0–1.9)

## 2022-04-30 LAB — HEMOGLOBIN A1C: Hgb A1c MFr Bld: 6.7 % — ABNORMAL HIGH (ref 4.6–6.5)

## 2022-04-30 LAB — TSH: TSH: 0.96 u[IU]/mL (ref 0.35–5.50)

## 2022-04-30 LAB — SEDIMENTATION RATE: Sed Rate: 10 mm/hr (ref 0–30)

## 2022-04-30 LAB — C-REACTIVE PROTEIN: CRP: <1 mg/dL (ref 0.5–20.0)

## 2022-04-30 LAB — VITAMIN D 25 HYDROXY (VIT D DEFICIENCY, FRACTURES): VITD: 13.85 ng/mL — ABNORMAL LOW (ref 30.00–100.00)

## 2022-04-30 MED ORDER — VITAMIN D3 1.25 MG (50000 UT) PO CAPS: ORAL_CAPSULE | ORAL | 0 refills | Status: DC

## 2022-04-30 NOTE — Addendum Note (Signed)
Addended by: Lamar Blinks C on: 04/30/2022 12:37 PM   Modules accepted: Orders

## 2022-05-09 ENCOUNTER — Encounter: Payer: Self-pay | Admitting: Family Medicine

## 2022-05-21 ENCOUNTER — Ambulatory Visit: Admitting: Neurology

## 2022-05-23 ENCOUNTER — Ambulatory Visit: Admitting: Family Medicine

## 2022-06-11 ENCOUNTER — Ambulatory Visit: Admitting: Cardiology

## 2022-07-05 ENCOUNTER — Ambulatory Visit (HOSPITAL_BASED_OUTPATIENT_CLINIC_OR_DEPARTMENT_OTHER)

## 2022-07-24 ENCOUNTER — Encounter: Payer: Self-pay | Admitting: *Deleted

## 2022-07-24 ENCOUNTER — Other Ambulatory Visit: Payer: Self-pay | Admitting: Family Medicine

## 2022-07-24 DIAGNOSIS — I10 Essential (primary) hypertension: Secondary | ICD-10-CM

## 2022-07-24 DIAGNOSIS — E7800 Pure hypercholesterolemia, unspecified: Secondary | ICD-10-CM

## 2022-08-07 ENCOUNTER — Ambulatory Visit (HOSPITAL_BASED_OUTPATIENT_CLINIC_OR_DEPARTMENT_OTHER)

## 2022-08-09 ENCOUNTER — Encounter: Payer: Self-pay | Admitting: *Deleted

## 2022-08-25 NOTE — Patient Instructions (Addendum)
Good to see you today- I will be in touch with your labs Assuming all is well le'ts recheck in 6 months Recommend covid booster if not done recently   I will look for any evidence of an autoimmune disorder or other problem causing your muscle pain.  However, I would also like to try pausing your Lipitor for a few days and see if this helps.  If you feel comfortable, try not taking it for 5 to 7 days and see if this makes a difference  For weight loss and blood sugar control lets have you start on Rybelsus 3 mg.  We can increase this in about 1 month, let me know how you do with it.  Most common side effects are diarrhea and constipation

## 2022-08-25 NOTE — Progress Notes (Signed)
Margaret Sims at [facility] [address], [place], [place] [address] [phone]  Date:  09/02/2022   Name:  Margaret Sims   DOB:  1960/10/02   MRN:  [id number]  PCP:  Darreld Mclean, MD    Chief Complaint: Follow-up (Concerns/ questions: joint pain in her back, shoulders, and ankles. /Saturday she had to take Ibu. )   History of Present Illness:  Margaret Sims is a 62 y.o. very pleasant female patient who presents with the following:  Pt seen today for a follow-up visit- last seen by myself in October  Also, she has noted stiffness in her joints and muscles.  Esp her shoulders, back bother her  She tried taking a decreased dosage of her lipitor but it did not help  She has noted the stiffness/ pain for about 9-10 months  She has been on lipitor since 2017 She has used some ibuprofen which does help some when she uses it  History of diet-controlled diabetes with neuropathy, hypertension, dyslipidemia, smoking-recently quit, vit D def   Eye exam- per pt done in September Foot exam Covid booster due Can update A1c Labs done in October   Due for lung cancer screening- she has this scheduled already   Asa, lipitor, lisinopril, prilosec  Lab Results  Component Value Date   HGBA1C 6.7 (H) 04/29/2022   Colon due this year   She is interested in potentially starting on a GLP-1 drug for her diabetes and weight control.  However, she is hesitant about doing an injectable.  We discussed Rybelsus and she would like to try this.  She has no contraindications to GLP-1 Patient Active Problem List   Diagnosis Date Noted   Ventricular tachycardia ([place]) 02/21/2022   Syncope and collapse 01/17/2022   Type 2 diabetes mellitus with diabetic neuropathy, unspecified ([place]) 05/01/2020   Essential hypertension 02/19/2016   Dyslipidemia 02/19/2016   Tobacco use disorder 02/19/2016    Past Medical History:  Diagnosis Date   Acid reflux     Diabetes type 2, controlled ([place]) 04/16/2016   High cholesterol    Hypertension     Past Surgical History:  Procedure Laterality Date   CESAREAN SECTION      Social History   Tobacco Use   Smoking status: Former    Packs/day: 0.50    Types: Cigarettes    Quit date: 09/05/2021    Years since quitting: 0.9   Smokeless tobacco: Never  Vaping Use   Vaping Use: Never used  Substance Use Topics   Alcohol use: Yes    Comment: 1-2 times per week   Drug use: Never    Family History  Problem Relation Age of Onset   Stroke Maternal Grandmother     No Known Allergies  Medication list has been reviewed and updated.  Current Outpatient Medications on File Prior to Visit  Medication Sig Dispense Refill   aspirin 81 MG tablet Take 81 mg by mouth daily.     atorvastatin (LIPITOR) 40 MG tablet TAKE ONE (1) TABLET BY MOUTH EACH DAY 90 tablet 1   lisinopril (ZESTRIL) 40 MG tablet TAKE ONE (1) TABLET BY MOUTH EACH DAY 90 tablet 1   omeprazole (PRILOSEC) 40 MG capsule Take 1 capsule (40 mg total) by mouth daily. 90 capsule 1   No current facility-administered medications on file prior to visit.    Review of Systems:  As per HPI- otherwise negative.  Wt  Readings from Last 3 Encounters:  09/02/22 191 lb 12.8 oz (87 kg)  04/29/22 182 lb 3.2 oz (82.6 kg)  04/01/22 178 lb 0.6 oz (80.8 kg)      Physical Examination: Vitals:   09/02/22 1321  BP: 120/82  Pulse: 76  Resp: 18  Temp: 97.8 F (36.6 C)  SpO2: 96%   Vitals:   09/02/22 1321  Weight: 191 lb 12.8 oz (87 kg)  Height: '5\' 7"'$  (1.702 m)   Body mass index is 30.04 kg/m. Ideal Body Weight: Weight in (lb) to have BMI = 25: 159.3  GEN: no acute distress.  Mildly obese, looks well HEENT: Atraumatic, Normocephalic.  Ears and Nose: No external deformity. CV: RRR, No M/G/R. No JVD. No thrill. No extra heart sounds. PULM: CTA B, no wheezes, crackles, rhonchi. No retractions. No resp. distress. No accessory muscle  use. ABD: S, NT, ND, +BS. No rebound. No HSM. EXTR: No c/c/e PSYCH: Normally interactive. Conversant.  Foot exam- completed, normal   Assessment and Plan: Hyperlipidemia associated with type 2 diabetes mellitus ([place])  Controlled type 2 diabetes mellitus without complication, without long-term current use of insulin ([place]) - Plan: Basic metabolic panel, Hemoglobin A1c, Semaglutide (RYBELSUS) 3 MG TABS  Essential hypertension - Plan: Basic metabolic panel  Screening for lung cancer  Vitamin D deficiency  Myalgia - Plan: Sedimentation rate, C-reactive protein, Rheumatoid Factor, Antinuclear Antib (ANA), CK (Creatine Kinase)  Patient seen today for follow-up.  Lab work is pending as above.  A1c pending.  She has noted some muscle and body aches, suspect this may be related to statin use.  She will try pausing her statin for a few days and see if this improves her symptoms.  Autoimmune workup is pending Also prescribed Rybelsus for weight loss and blood sugar control-she will let me know how she does with 3 mg, we can increase monthly as needed and tolerated Signed Lamar Blinks, MD  Received labs 2/27- message to pt  Results for orders placed or performed in visit on 0000000  Basic metabolic panel  Result Value Ref Range   Sodium 142 135 - 145 mEq/L   Potassium 3.8 3.5 - 5.1 mEq/L   Chloride 107 96 - 112 mEq/L   CO2 28 19 - 32 mEq/L   Glucose, Bld 85 70 - 99 mg/dL   BUN 11 6 - 23 mg/dL   Creatinine, Ser 1.02 0.40 - 1.20 mg/dL   GFR 58.88 (L) >60.00 mL/min   Calcium 9.7 8.4 - 10.5 mg/dL  Hemoglobin A1c  Result Value Ref Range   Hgb A1c MFr Bld 6.8 (H) 4.6 - 6.5 %  Sedimentation rate  Result Value Ref Range   Sed Rate 10 0 - 30 mm/hr  C-reactive protein  Result Value Ref Range   CRP <1.0 0.5 - 20.0 mg/dL  Rheumatoid Factor  Result Value Ref Range   Rhuematoid fact SerPl-aCnc <14 <14 IU/mL  CK (Creatine Kinase)  Result Value Ref Range   Total CK 149 7 - 177 U/L

## 2022-09-02 ENCOUNTER — Ambulatory Visit: Admitting: Family Medicine

## 2022-09-02 VITALS — BP 120/82 | HR 76 | Temp 97.8°F | Resp 18 | Ht 67.0 in | Wt 191.8 lb

## 2022-09-02 DIAGNOSIS — E785 Hyperlipidemia, unspecified: Secondary | ICD-10-CM

## 2022-09-02 DIAGNOSIS — E559 Vitamin D deficiency, unspecified: Secondary | ICD-10-CM | POA: Diagnosis not present

## 2022-09-02 DIAGNOSIS — E119 Type 2 diabetes mellitus without complications: Secondary | ICD-10-CM | POA: Diagnosis not present

## 2022-09-02 DIAGNOSIS — Z122 Encounter for screening for malignant neoplasm of respiratory organs: Secondary | ICD-10-CM

## 2022-09-02 DIAGNOSIS — M791 Myalgia, unspecified site: Secondary | ICD-10-CM | POA: Diagnosis not present

## 2022-09-02 DIAGNOSIS — E1169 Type 2 diabetes mellitus with other specified complication: Secondary | ICD-10-CM

## 2022-09-02 DIAGNOSIS — I10 Essential (primary) hypertension: Secondary | ICD-10-CM

## 2022-09-02 MED ORDER — RYBELSUS 3 MG PO TABS: 3.0000 mg | ORAL_TABLET | Freq: Every day | ORAL | 1 refills | Status: DC

## 2022-09-02 MED ORDER — RYBELSUS 3 MG PO TABS: 3.0000 mg | ORAL_TABLET | Freq: Every day | ORAL | 2 refills | Status: DC

## 2022-09-03 ENCOUNTER — Telehealth: Payer: Self-pay

## 2022-09-03 ENCOUNTER — Encounter: Payer: Self-pay | Admitting: Family Medicine

## 2022-09-03 DIAGNOSIS — E119 Type 2 diabetes mellitus without complications: Secondary | ICD-10-CM

## 2022-09-03 LAB — C-REACTIVE PROTEIN: CRP: <1 mg/dL (ref 0.5–20.0)

## 2022-09-03 LAB — BASIC METABOLIC PANEL
BUN: 11 mg/dL (ref 6–23)
CO2: 28 mEq/L (ref 19–32)
Calcium: 9.7 mg/dL (ref 8.4–10.5)
Chloride: 107 mEq/L (ref 96–112)
Creatinine, Ser: 1.02 mg/dL (ref 0.40–1.20)
GFR: 58.88 mL/min — ABNORMAL LOW (ref >60.00)
Glucose, Bld: 85 mg/dL (ref 70–99)
Potassium: 3.8 mEq/L (ref 3.5–5.1)
Sodium: 142 mEq/L (ref 135–145)

## 2022-09-03 LAB — SEDIMENTATION RATE: Sed Rate: 10 mm/hr (ref 0–30)

## 2022-09-03 LAB — HEMOGLOBIN A1C: Hgb A1c MFr Bld: 6.8 % — ABNORMAL HIGH (ref 4.6–6.5)

## 2022-09-03 LAB — CK: Total CK: 149 U/L (ref 7–177)

## 2022-09-03 MED ORDER — METFORMIN HCL 500 MG PO TABS: 500.0000 mg | ORAL_TABLET | Freq: Every day | ORAL | 1 refills | Status: DC

## 2022-09-03 NOTE — Telephone Encounter (Signed)
Pt's plan requires a trial/failure of metformin before Rybelsus will be covered.

## 2022-09-03 NOTE — Telephone Encounter (Signed)
Rx changed to metformin.

## 2022-09-04 LAB — ANA: Anti Nuclear Antibody (ANA): POSITIVE — AB

## 2022-09-04 LAB — RHEUMATOID FACTOR: Rheumatoid fact SerPl-aCnc: <14 IU/mL (ref <14)

## 2022-09-04 LAB — ANTI-NUCLEAR AB-TITER (ANA TITER): ANA Titer 1: 1:40 {titer} — ABNORMAL HIGH

## 2022-09-05 ENCOUNTER — Ambulatory Visit (HOSPITAL_BASED_OUTPATIENT_CLINIC_OR_DEPARTMENT_OTHER)
Admission: RE | Admit: 2022-09-05 | Discharge: 2022-09-05 | Disposition: A | Discharge status: Home / Self Care | Source: Ambulatory Visit | Attending: Family Medicine | Admitting: Family Medicine

## 2022-09-05 ENCOUNTER — Encounter: Payer: Self-pay | Admitting: Family Medicine

## 2022-09-05 DIAGNOSIS — Z122 Encounter for screening for malignant neoplasm of respiratory organs: Secondary | ICD-10-CM | POA: Insufficient documentation

## 2022-09-07 ENCOUNTER — Encounter: Payer: Self-pay | Admitting: Family Medicine

## 2022-09-07 DIAGNOSIS — E1169 Type 2 diabetes mellitus with other specified complication: Secondary | ICD-10-CM

## 2022-09-07 DIAGNOSIS — I10 Essential (primary) hypertension: Secondary | ICD-10-CM

## 2022-09-07 DIAGNOSIS — E119 Type 2 diabetes mellitus without complications: Secondary | ICD-10-CM

## 2022-09-24 ENCOUNTER — Encounter: Payer: Self-pay | Admitting: Family Medicine

## 2022-09-24 ENCOUNTER — Ambulatory Visit (HOSPITAL_BASED_OUTPATIENT_CLINIC_OR_DEPARTMENT_OTHER)
Admission: RE | Admit: 2022-09-24 | Discharge: 2022-09-24 | Disposition: A | Discharge status: Home / Self Care | Source: Ambulatory Visit | Attending: Family Medicine | Admitting: Family Medicine

## 2022-09-24 DIAGNOSIS — E1169 Type 2 diabetes mellitus with other specified complication: Secondary | ICD-10-CM | POA: Insufficient documentation

## 2022-09-24 DIAGNOSIS — E119 Type 2 diabetes mellitus without complications: Secondary | ICD-10-CM | POA: Insufficient documentation

## 2022-09-24 DIAGNOSIS — E785 Hyperlipidemia, unspecified: Secondary | ICD-10-CM

## 2022-09-24 DIAGNOSIS — I10 Essential (primary) hypertension: Secondary | ICD-10-CM | POA: Insufficient documentation

## 2022-09-25 ENCOUNTER — Encounter: Payer: Self-pay | Admitting: Family Medicine

## 2022-09-25 DIAGNOSIS — E1169 Type 2 diabetes mellitus with other specified complication: Secondary | ICD-10-CM

## 2022-09-25 DIAGNOSIS — R931 Abnormal findings on diagnostic imaging of heart and coronary circulation: Secondary | ICD-10-CM

## 2022-11-01 DIAGNOSIS — E7800 Pure hypercholesterolemia, unspecified: Secondary | ICD-10-CM | POA: Insufficient documentation

## 2022-11-01 DIAGNOSIS — K219 Gastro-esophageal reflux disease without esophagitis: Secondary | ICD-10-CM | POA: Insufficient documentation

## 2022-11-12 ENCOUNTER — Ambulatory Visit: Attending: Cardiology | Admitting: Cardiology

## 2022-11-12 ENCOUNTER — Encounter: Payer: Self-pay | Admitting: Cardiology

## 2022-11-12 VITALS — BP 130/80 | HR 100 | Ht 67.0 in | Wt 184.1 lb

## 2022-11-12 DIAGNOSIS — I10 Essential (primary) hypertension: Secondary | ICD-10-CM

## 2022-11-12 DIAGNOSIS — R0989 Other specified symptoms and signs involving the circulatory and respiratory systems: Secondary | ICD-10-CM

## 2022-11-12 DIAGNOSIS — Z7985 Long-term (current) use of injectable non-insulin antidiabetic drugs: Secondary | ICD-10-CM

## 2022-11-12 DIAGNOSIS — F17200 Nicotine dependence, unspecified, uncomplicated: Secondary | ICD-10-CM

## 2022-11-12 DIAGNOSIS — E785 Hyperlipidemia, unspecified: Secondary | ICD-10-CM

## 2022-11-12 DIAGNOSIS — E1140 Type 2 diabetes mellitus with diabetic neuropathy, unspecified: Secondary | ICD-10-CM | POA: Diagnosis not present

## 2022-11-12 DIAGNOSIS — I2510 Atherosclerotic heart disease of native coronary artery without angina pectoris: Secondary | ICD-10-CM

## 2022-11-12 NOTE — Progress Notes (Signed)
Cardiology Office Note:    Date:  11/12/2022   ID:  Margaret Sims, DOB 06-06-1961, MRN [id number]  PCP:  Pearline Cables, MD  Cardiologist:  Garwin Brothers, MD   Referring MD: Pearline Cables, MD    ASSESSMENT:    1. Essential hypertension   2. Type 2 diabetes mellitus with diabetic neuropathy, without long-term current use of insulin (HCC)   3. Dyslipidemia   4. Tobacco use disorder   5. Class 2 severe obesity due to excess calories with serious comorbidity in adult, unspecified BMI (HCC)   6. Abdominal bruit   7. Coronary artery disease involving native coronary artery of native heart without angina pectoris    PLAN:    In order of problems listed above:  Elevated calcium score: Secondary prevention stressed with the patient.  Importance of compliance with diet medication stressed and she vocalized understanding.  She has an excellent exercise protocol and I congratulated her and told to exercise at least 5 times a week and she promises to do so.  Echo and stress test from last year was reviewed with her. Essential hypertension: Blood pressure stable and diet was emphasized Mixed dyslipidemia: On lipid-lowering medications.  She will be back in 6 weeks for liver lipid check.  Goal LDL must be less than 60. Cigarette smoker: I spent 5 minutes with the patient discussing solely about smoking. Smoking cessation was counseled. I suggested to the patient also different medications and pharmacological interventions. Patient is keen to try stopping on its own at this time. He will get back to me if he needs any further assistance in this matter. Obesity: Weight reduction stressed diet emphasized and she promises to do better.  Risks of obesity explained. Patient will be seen in follow-up appointment in 6 months or earlier if the patient has any concerns.    Medication Adjustments/Labs and Tests Ordered: Current medicines are reviewed at length with the patient today.  Concerns  regarding medicines are outlined above.  No orders of the defined types were placed in this encounter.  No orders of the defined types were placed in this encounter.    History of Present Illness:    Margaret Sims is a 62 y.o. female who is being seen today for the evaluation of elevated calcium score at the request of Copland, [name], MD. patient is a pleasant 62 year old female.  She has past medical history of essential hypertension, dyslipidemia and cigarette smoking.  She mentions to me that she is also diabetic.  She exercises on a regular basis she goes to the gym 3-4 times a week and I reviewed her exercise plan and it looks good.  With this she has no symptoms.  No chest pain orthopnea or PND.  She had echo and stress test late last year and I reviewed that.  At the time of my evaluation, the patient is alert awake oriented and in no distress.  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 IMPRESSION: Colonoscopy reveals multiple areas of diverticulosis. Will treat for suspected diverticulitis. RTO if sxs worsen or do not improve. gwt 03/05/2012   Dyslipidemia 02/19/2016   Encounter for gynecological examination 12/07/2018   Essential hypertension 02/19/2016   High cholesterol    Hypercholesterolemia 06/23/2013   Formatting of this note might be different from  the original. IMPRESSION: The goal is to have your total cholesterol < 200, the HDL (good cholesterol) >40, and the LDL (bad cholesterol) <100.  And triglycerides should be under 150.`E1o3L`It is recommended that you follow a good diet low in animal and dairy fat and exercise for at least 45 minutes a day 5-6 days a week. `E1o3L`*LDL still high at 14   Obesity 12/07/2018   Syncope and collapse 01/17/2022   Tobacco use disorder 02/19/2016   Type 2 diabetes mellitus  with diabetic neuropathy, unspecified (HCC) 05/01/2020   Ventricular tachycardia (HCC) 02/21/2022    Past Surgical History:  Procedure Laterality Date   CESAREAN SECTION      Current Medications: Current Meds  Medication Sig   aspirin 81 MG tablet Take 81 mg by mouth daily.   atorvastatin (LIPITOR) 40 MG tablet TAKE ONE (1) TABLET BY MOUTH EACH DAY   lisinopril (ZESTRIL) 40 MG tablet TAKE ONE (1) TABLET BY MOUTH EACH DAY   metFORMIN (GLUCOPHAGE) 500 MG tablet Take 1 tablet (500 mg total) by mouth daily.   omeprazole (PRILOSEC) 40 MG capsule Take 1 capsule (40 mg total) by mouth daily.   Semaglutide (RYBELSUS) 3 MG TABS Take 1 tablet (3 mg total) by mouth daily.     Allergies:   Patient has no known allergies.   Social History   Socioeconomic History   Marital status: Married    Spouse name: Not on file   Number of children: Not on file   Years of education: Not on file   Highest education level: Not on file  Occupational History   Not on file  Tobacco Use   Smoking status: Former    Packs/day: .5    Types: Cigarettes    Quit date: 09/05/2021    Years since quitting: 1.1   Smokeless tobacco: Never  Vaping Use   Vaping Use: Never used  Substance and Sexual Activity   Alcohol use: Yes    Comment: 1-2 times per week   Drug use: Never   Sexual activity: Yes    Birth control/protection: Post-menopausal  Other Topics Concern   Not on file  Social History Narrative   Right handed   Social Determinants of Health   Financial Resource Strain: Not on file  Food Insecurity: Not on file  Transportation Needs: Not on file  Physical Activity: Not on file  Stress: Not on file  Social Connections: Not on file     Family History: The patient's family history includes Stroke in her maternal grandmother.  ROS:   Please see the history of present illness.    All other systems reviewed and are negative.  EKGs/Labs/Other Studies Reviewed:    The following studies were  reviewed today: EKG revealed sinus rhythm LVH left axis deviation and T wave inversions in lateral leads.   Recent Labs: 04/29/2022: ALT 10; Hemoglobin 13.0; Platelets 280.0; TSH 0.96 09/02/2022: BUN 11; Creatinine, Ser 1.02; Potassium 3.8; Sodium 142  Recent Lipid Panel    Component Value Date/Time   CHOL 200 04/29/2022 1423   TRIG 177.0 (H) 04/29/2022 1423   HDL 47.90 04/29/2022 1423   CHOLHDL 4 04/29/2022 1423   VLDL 35.4 04/29/2022 1423   LDLCALC 117 (H) 04/29/2022 1423    Physical Exam:    VS:  BP 130/80   Pulse 100   Ht 5\' 7"  (1.702 m)   Wt 184 lb 1.3 oz (83.5 kg)   SpO2 97%   BMI 28.83 kg/m  Wt Readings from Last 3 Encounters:  11/12/22 184 lb 1.3 oz (83.5 kg)  09/02/22 191 lb 12.8 oz (87 kg)  04/29/22 182 lb 3.2 oz (82.6 kg)     GEN: Patient is in no acute distress HEENT: Normal NECK: No JVD; No carotid bruits LYMPHATICS: No lymphadenopathy CARDIAC: S1 S2 regular, 2/6 systolic murmur at the apex. RESPIRATORY:  Clear to auscultation without rales, wheezing or rhonchi  ABDOMEN: Soft, non-tender, non-distended abdominal bruit heard. MUSCULOSKELETAL:  No edema; No deformity  SKIN: Warm and dry NEUROLOGIC:  Alert and oriented x 3 PSYCHIATRIC:  Normal affect    Signed, Garwin Brothers, MD  11/12/2022 11:19 AM    [facility]

## 2022-11-12 NOTE — Patient Instructions (Signed)
Medication Instructions:  Your physician recommends that you continue on your current medications as directed. Please refer to the Current Medication list given to you today.  *If you need a refill on your cardiac medications before your next appointment, please call your pharmacy*   Lab Work: Your physician recommends that you return for lab work in: 6 weeks for CMP and lipids. You need to have labs done when you are fasting. [facility] lab is located on the 3rd floor, [address]. Hours are Monday - Friday 8 am to 4 pm, closed 11:30 am to 1:00 pm. You do NOT need an appointment.   If you have labs (blood work) drawn today and your tests are completely normal, you will receive your results only by: MyChart Message (if you have MyChart) OR A paper copy in the mail If you have any lab test that is abnormal or we need to change your treatment, we will call you to review the results.   Testing/Procedures: Your physician has requested that you have an abdominal aorta duplex. During this test, an ultrasound is used to evaluate the aorta. Allow 30 minutes for this exam. Do not eat after midnight the day before and avoid carbonated beverages    Follow-Up: At [facility], you and your health needs are our priority.  As part of our continuing mission to provide you with exceptional heart care, we have created designated Provider Care Teams.  These Care Teams include your primary Cardiologist (physician) and Advanced Practice Providers (APPs -  Physician Assistants and Nurse Practitioners) who all work together to provide you with the care you need, when you need it.  We recommend signing up for the patient portal called "MyChart".  Sign up information is provided on this After Visit Summary.  MyChart is used to connect with patients for Virtual Visits (Telemedicine).  Patients are able to view lab/test results, encounter notes, upcoming appointments, etc.  Non-urgent messages can be sent to  your provider as well.   To learn more about what you can do with MyChart, go to [email or web address].    Your next appointment:   9 month(s)  The format for your next appointment:   In Person  Provider:   Belva Crome, MD    Other Instructions none  Important Information About Sugar

## 2022-11-22 ENCOUNTER — Other Ambulatory Visit: Payer: Self-pay | Admitting: Family Medicine

## 2022-11-26 ENCOUNTER — Ambulatory Visit (HOSPITAL_COMMUNITY)
Admission: RE | Admit: 2022-11-26 | Discharge: 2022-11-26 | Disposition: A | Discharge status: Home / Self Care | Source: Ambulatory Visit | Attending: Cardiology | Admitting: Cardiology

## 2022-11-26 DIAGNOSIS — R0989 Other specified symptoms and signs involving the circulatory and respiratory systems: Secondary | ICD-10-CM

## 2022-12-12 LAB — HM DIABETES EYE EXAM

## 2023-01-27 ENCOUNTER — Ambulatory Visit: Admitting: Family Medicine

## 2023-01-27 ENCOUNTER — Encounter: Payer: Self-pay | Admitting: Family Medicine

## 2023-01-27 VITALS — BP 140/90 | HR 68 | Temp 98.6°F | Resp 18 | Ht 67.0 in | Wt 179.8 lb

## 2023-01-27 DIAGNOSIS — U071 COVID-19: Secondary | ICD-10-CM

## 2023-01-27 DIAGNOSIS — R0981 Nasal congestion: Secondary | ICD-10-CM

## 2023-01-27 DIAGNOSIS — R52 Pain, unspecified: Secondary | ICD-10-CM | POA: Diagnosis not present

## 2023-01-27 LAB — POC COVID19 BINAXNOW: SARS Coronavirus 2 Ag: POSITIVE — AB

## 2023-01-27 MED ORDER — PROMETHAZINE-DM 6.25-15 MG/5ML PO SYRP
5.0000 mL | ORAL_SOLUTION | Freq: Four times a day (QID) | ORAL | 0 refills | Status: DC | PRN
Start: 2023-01-27 — End: 2023-02-07

## 2023-01-27 MED ORDER — NIRMATRELVIR/RITONAVIR (PAXLOVID)TABLET
3.0000 | ORAL_TABLET | Freq: Two times a day (BID) | ORAL | 0 refills | Status: AC
Start: 2023-01-27 — End: 2023-02-01

## 2023-01-27 NOTE — Assessment & Plan Note (Signed)
Paxlovid and cough med Return to office as needed Quarantine 5 days-- then can leave the office for 5 days with mask

## 2023-01-27 NOTE — Progress Notes (Signed)
Established Patient Office Visit  Subjective   Patient ID: Margaret Sims, female    DOB: May 27, 1961  Age: 62 y.o. MRN: [id number]  Chief Complaint  Patient presents with   Sinus Problem    X1 day, pt states having congestion, body aches, headache, teeth pain, no cough or sore throat, no fever    HPI Discussed the use of AI scribe software for clinical note transcription with the patient, who gave verbal consent to proceed.  History of Present Illness   The patient presents with a headache and nausea that started yesterday afternoon while returning from a vacation in [place]. They describe the headache as a 'pounding' sensation. They also report feeling generally unwell. They have taken Tylenol and Mucinex for symptom relief. The patient's daughter, who was on vacation with them, recently tested positive for COVID-19. The patient has not had a fever but reports a scratchy throat and occasional coughing. They deny any significant coughing that would disrupt sleep.      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 IMPRESSION: Colonoscopy reveals multiple areas of diverticulosis. Will  treat for suspected diverticulitis. RTO if sxs worsen or do not improve. gwt 03/05/2012   Dyslipidemia 02/19/2016   Encounter for gynecological examination 12/07/2018   Essential hypertension 02/19/2016   High cholesterol    Hypercholesterolemia 06/23/2013   Formatting of this note might be different from the original. IMPRESSION: The goal is to have your total cholesterol < 200, the HDL (good cholesterol) >40, and the LDL (bad cholesterol) <100.  And triglycerides should be under 150.`E1o3L`It is recommended that you follow a good diet low in animal and dairy fat and exercise for at least 45 minutes a day 5-6 days a week. `E1o3L`*LDL still high at 14   Obesity 12/07/2018   Syncope and collapse 01/17/2022   Tobacco use disorder 02/19/2016   Type 2 diabetes mellitus with diabetic neuropathy, unspecified (HCC) 05/01/2020   Ventricular tachycardia (HCC) 02/21/2022   Past Surgical History:  Procedure Laterality Date   CESAREAN SECTION     Social History   Tobacco Use   Smoking status: Former    Current packs/day: 0.00    Types: Cigarettes    Quit date: 09/05/2021    Years since quitting: 1.3   Smokeless tobacco: Never  Vaping Use   Vaping status: Never Used  Substance Use Topics   Alcohol use: Yes    Comment: 1-2 times per week   Drug use: Never   Social History   Socioeconomic History   Marital status: Married    Spouse name: Not on file   Number of children: Not on file   Years of education: Not on file   Highest education level:  Not on file  Occupational History   Not on file  Tobacco Use   Smoking status: Former    Current packs/day: 0.00    Types: Cigarettes    Quit date: 09/05/2021    Years since quitting: 1.3   Smokeless tobacco: Never  Vaping Use   Vaping status: Never Used  Substance and Sexual Activity   Alcohol use: Yes    Comment: 1-2 times per week   Drug use: Never   Sexual activity: Yes    Birth control/protection: Post-menopausal  Other Topics Concern    Not on file  Social History Narrative   Right handed   Social Determinants of Health   Financial Resource Strain: Not on file  Food Insecurity: Not on file  Transportation Needs: Not on file  Physical Activity: Not on file  Stress: Not on file  Social Connections: Not on file  Intimate Partner Violence: Not on file   Family Status  Relation Name Status   Mother  Alive   Father  Deceased   MGM  Deceased   MGF  Deceased   PGM  Deceased   PGF  Deceased  No partnership data on file   Family History  Problem Relation Age of Onset   Stroke Maternal Grandmother    No Known Allergies    Review of Systems  Constitutional:  Negative for fever and malaise/fatigue.  HENT:  Negative for congestion.   Eyes:  Negative for blurred vision.  Respiratory:  Negative for shortness of breath.   Cardiovascular:  Negative for chest pain, palpitations and leg swelling.  Gastrointestinal:  Negative for abdominal pain, blood in stool and nausea.  Genitourinary:  Negative for dysuria and frequency.  Musculoskeletal:  Negative for falls.  Skin:  Negative for rash.  Neurological:  Negative for dizziness, loss of consciousness and headaches.  Endo/Heme/Allergies:  Negative for environmental allergies.  Psychiatric/Behavioral:  Negative for depression. The patient is not nervous/anxious.       Objective:     BP (!) 140/90 (BP Location: Right Arm, Patient Position: Sitting, Cuff Size: Normal)   Pulse 68   Temp 98.6 F (37 C) (Oral)   Resp 18   Ht 5\' 7"  (1.702 m)   Wt 179 lb 12.8 oz (81.6 kg)   SpO2 100%   BMI 28.16 kg/m  BP Readings from Last 3 Encounters:  01/27/23 (!) 140/90  11/12/22 130/80  09/02/22 120/82   Wt Readings from Last 3 Encounters:  01/27/23 179 lb 12.8 oz (81.6 kg)  11/12/22 184 lb 1.3 oz (83.5 kg)  09/02/22 191 lb 12.8 oz (87 kg)   SpO2 Readings from Last 3 Encounters:  01/27/23 100%  11/12/22 97%  09/02/22 96%      Physical Exam Vitals and nursing  note reviewed.  Constitutional:      General: She is not in acute distress.    Appearance: Normal appearance. She is well-developed.  HENT:     Head: Normocephalic and atraumatic.     Nose: Congestion and rhinorrhea present.  Eyes:     General: No scleral icterus.       Right eye: No discharge.        Left eye: No discharge.  Cardiovascular:     Rate and Rhythm: Normal rate and regular rhythm.     Heart sounds: No murmur heard. Pulmonary:     Effort: Pulmonary effort is normal. No respiratory distress.     Breath sounds: Normal breath sounds. No wheezing, rhonchi or rales.  Chest:     Chest wall: No tenderness.  Musculoskeletal:        General: Normal range of motion.     Cervical back: Normal range of motion and neck supple.     Right lower leg: No edema.     Left lower leg: No edema.  Skin:    General: Skin is warm and dry.  Neurological:     Mental Status: She is alert and oriented to person, place, and time.  Psychiatric:        Mood and Affect: Mood normal.        Behavior: Behavior normal.        Thought Content: Thought content normal.        Judgment: Judgment normal.      Results for orders placed or performed in visit on 01/27/23  POC COVID-19  Result Value Ref Range   SARS Coronavirus 2 Ag Positive (A) Negative    Last CBC Lab Results  Component Value Date   WBC 7.3 04/29/2022   HGB 13.0 04/29/2022   HCT 39.8 04/29/2022   MCV 95.7 04/29/2022   MCH 31.7 01/09/2022   RDW 13.4 04/29/2022   PLT 280.0 04/29/2022   Last metabolic panel Lab Results  Component Value Date   GLUCOSE 85 09/02/2022   NA 142 09/02/2022   K 3.8 09/02/2022   CL 107 09/02/2022   CO2 28 09/02/2022   BUN 11 09/02/2022   CREATININE 1.02 09/02/2022   GFR 58.88 (L) 09/02/2022   CALCIUM 9.7 09/02/2022   PROT 6.5 04/29/2022   ALBUMIN 4.2 04/29/2022   BILITOT 0.4 04/29/2022   ALKPHOS 94 04/29/2022   AST 16 04/29/2022   ALT 10 04/29/2022   ANIONGAP 11 01/09/2022   Last  lipids Lab Results  Component Value Date   CHOL 200 04/29/2022   HDL 47.90 04/29/2022   LDLCALC 117 (H) 04/29/2022   TRIG 177.0 (H) 04/29/2022   CHOLHDL 4 04/29/2022   Last hemoglobin A1c Lab Results  Component Value Date   HGBA1C 6.8 (H) 09/02/2022   Last thyroid functions Lab Results  Component Value Date   TSH 0.96 04/29/2022   Last vitamin D Lab Results  Component Value Date   VD25OH 13.85 (L) 04/29/2022   Last vitamin B12 and Folate Lab Results  Component Value Date   VITAMINB12 318 04/29/2022   FOLATE 11.4 09/17/2017      The 10-year ASCVD risk score (Arnett DK, et al., 2019) is: 23.8%    Assessment & Plan:   Problem List Items Addressed This Visit       Unprioritized   COVID-19 - Primary    Paxlovid and cough med Return to office as needed Quarantine 5 days-- then can leave the office for 5 days with mask       Relevant Medications   nirmatrelvir/ritonavir (PAXLOVID) 20 x 150 MG & 10 x 100MG  TABS   promethazine-dextromethorphan (PROMETHAZINE-DM) 6.25-15 MG/5ML syrup   Other Visit Diagnoses     Body aches       Congestion of nasal sinus       Relevant Orders   POC COVID-19 (Completed)       No follow-ups on file.    Donato Schultz, DO

## 2023-02-04 ENCOUNTER — Other Ambulatory Visit: Payer: Self-pay

## 2023-02-04 ENCOUNTER — Emergency Department (HOSPITAL_COMMUNITY): Admission: EM | Admit: 2023-02-04 | Discharge: 2023-02-04 | Discharge status: Against Medical Advice

## 2023-02-04 ENCOUNTER — Encounter (HOSPITAL_BASED_OUTPATIENT_CLINIC_OR_DEPARTMENT_OTHER): Payer: Self-pay | Admitting: Emergency Medicine

## 2023-02-04 ENCOUNTER — Emergency Department (HOSPITAL_BASED_OUTPATIENT_CLINIC_OR_DEPARTMENT_OTHER)
Admission: EM | Admit: 2023-02-04 | Discharge: 2023-02-04 | Disposition: A | Discharge status: Home / Self Care | Attending: Emergency Medicine | Admitting: Emergency Medicine

## 2023-02-04 ENCOUNTER — Emergency Department (HOSPITAL_BASED_OUTPATIENT_CLINIC_OR_DEPARTMENT_OTHER): Admitting: Radiology

## 2023-02-04 ENCOUNTER — Telehealth: Payer: Self-pay | Admitting: Family Medicine

## 2023-02-04 ENCOUNTER — Ambulatory Visit
Admission: EM | Admit: 2023-02-04 | Discharge: 2023-02-04 | Disposition: A | Discharge status: Home / Self Care | Attending: Emergency Medicine | Admitting: Emergency Medicine

## 2023-02-04 DIAGNOSIS — Z79899 Other long term (current) drug therapy: Secondary | ICD-10-CM | POA: Diagnosis not present

## 2023-02-04 DIAGNOSIS — I10 Essential (primary) hypertension: Secondary | ICD-10-CM | POA: Diagnosis not present

## 2023-02-04 DIAGNOSIS — R0609 Other forms of dyspnea: Secondary | ICD-10-CM

## 2023-02-04 DIAGNOSIS — Z7982 Long term (current) use of aspirin: Secondary | ICD-10-CM | POA: Diagnosis not present

## 2023-02-04 DIAGNOSIS — R0602 Shortness of breath: Secondary | ICD-10-CM | POA: Diagnosis present

## 2023-02-04 DIAGNOSIS — I2510 Atherosclerotic heart disease of native coronary artery without angina pectoris: Secondary | ICD-10-CM | POA: Insufficient documentation

## 2023-02-04 DIAGNOSIS — E119 Type 2 diabetes mellitus without complications: Secondary | ICD-10-CM | POA: Insufficient documentation

## 2023-02-04 DIAGNOSIS — R110 Nausea: Secondary | ICD-10-CM | POA: Insufficient documentation

## 2023-02-04 DIAGNOSIS — Z7984 Long term (current) use of oral hypoglycemic drugs: Secondary | ICD-10-CM | POA: Diagnosis not present

## 2023-02-04 DIAGNOSIS — Z8616 Personal history of COVID-19: Secondary | ICD-10-CM | POA: Diagnosis not present

## 2023-02-04 DIAGNOSIS — R42 Dizziness and giddiness: Secondary | ICD-10-CM | POA: Insufficient documentation

## 2023-02-04 LAB — CBC WITH DIFFERENTIAL/PLATELET
Abs Immature Granulocytes: 0.02 10*3/uL (ref 0.00–0.07)
Basophils Absolute: 0 10*3/uL (ref 0.0–0.1)
Basophils Relative: 0 %
Eosinophils Absolute: 0.1 10*3/uL (ref 0.0–0.5)
Eosinophils Relative: 1 %
HCT: 40.6 % (ref 36.0–46.0)
Hemoglobin: 13.2 g/dL (ref 12.0–15.0)
Immature Granulocytes: 0 %
Lymphocytes Relative: 51 %
Lymphs Abs: 4.2 10*3/uL — ABNORMAL HIGH (ref 0.7–4.0)
MCH: 30.1 pg (ref 26.0–34.0)
MCHC: 32.5 g/dL (ref 30.0–36.0)
MCV: 92.5 fL (ref 80.0–100.0)
Monocytes Absolute: 0.5 10*3/uL (ref 0.1–1.0)
Monocytes Relative: 6 %
Neutro Abs: 3.4 10*3/uL (ref 1.7–7.7)
Neutrophils Relative %: 42 %
Platelets: 278 10*3/uL (ref 150–400)
RBC: 4.39 MIL/uL (ref 3.87–5.11)
RDW: 13.6 % (ref 11.5–15.5)
WBC: 8.2 10*3/uL (ref 4.0–10.5)
nRBC: 0 % (ref 0.0–0.2)

## 2023-02-04 LAB — D-DIMER, QUANTITATIVE: D-Dimer, Quant: 0.56 ug/mL-FEU — ABNORMAL HIGH (ref 0.00–0.50)

## 2023-02-04 LAB — BASIC METABOLIC PANEL
Anion gap: 10 (ref 5–15)
BUN: 12 mg/dL (ref 8–23)
CO2: 23 mmol/L (ref 22–32)
Calcium: 10.3 mg/dL (ref 8.9–10.3)
Chloride: 107 mmol/L (ref 98–111)
Creatinine, Ser: 1.03 mg/dL — ABNORMAL HIGH (ref 0.44–1.00)
GFR, Estimated: >60 mL/min (ref >60)
Glucose, Bld: 112 mg/dL — ABNORMAL HIGH (ref 70–99)
Potassium: 3.6 mmol/L (ref 3.5–5.1)
Sodium: 140 mmol/L (ref 135–145)

## 2023-02-04 LAB — TROPONIN I (HIGH SENSITIVITY): Troponin I (High Sensitivity): 9 ng/L (ref <18)

## 2023-02-04 LAB — BRAIN NATRIURETIC PEPTIDE: B Natriuretic Peptide: 12 pg/mL (ref 0.0–100.0)

## 2023-02-04 NOTE — ED Notes (Signed)
Patient is being discharged from the Urgent Care and sent to the Emergency Department via POV . Per Dr Chaney Malling, patient is in need of higher level of care due to dizziness, SHOB, recent covid dx. Patient is aware and verbalizes understanding of plan of care.  Vitals:   02/04/23 1632  BP: (!) 133/95  Pulse: 96  Resp: 20  Temp: 98.7 F (37.1 C)  SpO2: 95%

## 2023-02-04 NOTE — ED Notes (Signed)
Report given to the next RN... 

## 2023-02-04 NOTE — Discharge Instructions (Addendum)
Please read and follow all provided instructions.  Your diagnoses today include:  1. Shortness of breath     Tests performed today include: An EKG of your heart A chest x-ray Cardiac enzymes - a blood test for heart muscle damage Blood counts and electrolytes D-dimer: Screening test for blood clot was less than 0.63, which is considered normal for your age BNP: Blood test for fluid overload or congestive heart failure was normal Vital signs. See below for your results today.   Medications prescribed:  None  Take any prescribed medications only as directed.  Follow-up instructions: Please follow-up with your primary care provider in the next 3 days for further evaluation of your symptoms.  Return instructions:  SEEK IMMEDIATE MEDICAL ATTENTION IF: You have severe chest pain, especially if the pain is crushing or pressure-like and spreads to the arms, back, neck, or jaw, or if you have sweating, nausea or vomiting, or trouble with breathing. THIS IS AN EMERGENCY. Do not wait to see if the pain will go away. Get medical help at once. Call 911. DO NOT drive yourself to the hospital.  Your chest pain gets worse and does not go away after a few minutes of rest.  You have an attack of chest pain lasting longer than what you usually experience.  You have significant dizziness, if you pass out, or have trouble walking.  You have chest pain not typical of your usual pain for which you originally saw your caregiver.  You have any other emergent concerns regarding your health.  Additional Information: Chest pain comes from many different causes. Your caregiver has diagnosed you as having chest pain that is not specific for one problem, but does not require admission.  You are at low risk for an acute heart condition or other serious illness.   Your vital signs today were: BP (!) 169/115   Pulse 85   Temp 98.4 F (36.9 C) (Oral)   Resp 19   SpO2 99%  If your blood pressure (BP) was  elevated above 135/85 this visit, please have this repeated by your doctor within one month. --------------

## 2023-02-04 NOTE — ED Provider Notes (Signed)
[facility] AT [facility] Provider Note   CSN: [id number] Arrival date & time: 02/04/23  1846     History  Chief Complaint  Patient presents with   Shortness of Breath    Margaret Sims is a 62 y.o. female.  Patient with history of coronary artery disease as defined by elevated calcium score on coronary CT, history of diabetes, hyperlipidemia, hypertension --presents to the emergency department today for evaluation of shortness of breath with recent COVID infection.  Patient developed flulike illness with cough about a week ago.  She took Tylenol and Mucinex with good improvement of her symptoms.  She follow-up with her primary care doctor who tested her for COVID and this was positive.  She was started on Paxlovid.  Since that time patient has had nausea without vomiting, dizziness and lightheadedness with standing, shortness of breath that is worse with activity.  No associated chest pain.  No lower extremity edema.  Patient denies history of heart valve disease or congestive heart failure.  Patient was encouraged to go see urgent care today by her PCP who then referred her to the emergency department for other testing, including screening for blood clot.       Home Medications Prior to Admission medications   Medication Sig Start Date End Date Taking? Authorizing Provider  aspirin 81 MG tablet Take 81 mg by mouth daily.    [provider]  atorvastatin (LIPITOR) 40 MG tablet TAKE ONE (1) TABLET BY MOUTH EACH DAY 07/24/22   Copland, [name], MD  lisinopril (ZESTRIL) 40 MG tablet TAKE ONE (1) TABLET BY MOUTH EACH DAY 07/24/22   Copland, [name], MD  metFORMIN (GLUCOPHAGE) 500 MG tablet Take 1 tablet (500 mg total) by mouth daily. 09/03/22   Copland, [name], MD  omeprazole (PRILOSEC) 40 MG capsule TAKE 1 CAPSULE BY MOUTH DAILY 11/22/22   Copland, [name], MD  promethazine-dextromethorphan (PROMETHAZINE-DM) 6.25-15 MG/5ML syrup Take 5 mLs by  mouth 4 (four) times daily as needed. 01/27/23   Zola Button, Grayling Congress, DO  Semaglutide (RYBELSUS) 3 MG TABS Take 1 tablet (3 mg total) by mouth daily. 09/02/22   Copland, [name], MD      Allergies    Patient has no known allergies.    Review of Systems   Review of Systems  Physical Exam Updated Vital Signs BP (!) 163/97   Pulse 84   Temp 98.4 F (36.9 C) (Oral)   Resp 15   SpO2 98%  Physical Exam Vitals and nursing note reviewed.  Constitutional:      General: She is not in acute distress.    Appearance: She is well-developed.  HENT:     Head: Normocephalic and atraumatic.     Right Ear: External ear normal.     Left Ear: External ear normal.     Nose: Nose normal.  Eyes:     Conjunctiva/sclera: Conjunctivae normal.  Cardiovascular:     Rate and Rhythm: Normal rate and regular rhythm.     Heart sounds: No murmur heard. Pulmonary:     Effort: No respiratory distress.     Breath sounds: No wheezing, rhonchi or rales.  Abdominal:     Palpations: Abdomen is soft.     Tenderness: There is no abdominal tenderness. There is no guarding or rebound.  Musculoskeletal:     Cervical back: Normal range of motion and neck supple.     Right lower leg: No tenderness. No edema.  Left lower leg: No tenderness. No edema.  Skin:    General: Skin is warm and dry.     Findings: No rash.  Neurological:     General: No focal deficit present.     Mental Status: She is alert. Mental status is at baseline.     Motor: No weakness.  Psychiatric:        Mood and Affect: Mood normal.     ED Results / Procedures / Treatments   Labs (all labs ordered are listed, but only abnormal results are displayed) Labs Reviewed  CBC WITH DIFFERENTIAL/PLATELET - Abnormal; Notable for the following components:      Result Value   Lymphs Abs 4.2 (*)    All other components within normal limits  BASIC METABOLIC PANEL - Abnormal; Notable for the following components:   Glucose, Bld 112 (*)     Creatinine, Ser 1.03 (*)    All other components within normal limits  D-DIMER, QUANTITATIVE - Abnormal; Notable for the following components:   D-Dimer, Quant 0.56 (*)    All other components within normal limits  BRAIN NATRIURETIC PEPTIDE  TROPONIN I (HIGH SENSITIVITY)    ED ECG REPORT   Date: 02/04/2023  Rate: 102  Rhythm: sinus tachycardia  QRS Axis: left  Intervals: normal  ST/T Wave abnormalities: normal  Conduction Disutrbances:none  Narrative Interpretation:   Old EKG Reviewed: changes noted, lateral TWI resolved today  I have personally reviewed the EKG tracing and agree with the computerized printout as noted.   Radiology DG Chest 2 View  Result Date: 02/04/2023 CLINICAL DATA:  Shortness of breath. EXAM: CHEST - 2 VIEW COMPARISON:  January 09, 2022. FINDINGS: The heart size and mediastinal contours are within normal limits. Both lungs are clear. The visualized skeletal structures are unremarkable. IMPRESSION: No active cardiopulmonary disease. Electronically Signed   By: Lupita Raider M.D.   On: 02/04/2023 19:54    Procedures Procedures    Medications Ordered in ED Medications - No data to display  ED Course/ Medical Decision Making/ A&P    Patient seen and examined. History obtained directly from patient.  Reviewed urgent care note.  Reviewed previous outpatient cardiology notes.  Labs/EKG: Ordered CBC, BMP, added BNP, D-dimer and troponin.  EKG personally reviewed and interpreted as above..  Imaging: Chest x-ray personally reviewed and interpreted as above, agree negative.  Medications/Fluids: Ordered: None ordered.   Most recent vital signs reviewed and are as follows: BP (!) 163/97   Pulse 84   Temp 98.4 F (36.9 C) (Oral)   Resp 15   SpO2 98%   Initial impression: Shortness of breath and lightheadedness in setting of recent COVID infection.  Patient has ambulated in the hallway without significant shortness of breath and maintained oxygen  saturation above 98%.  10:04 PM Reassessment performed. Patient appears very comfortable.  Vital signs, other than hypertension, are normal.  Labs personally reviewed and interpreted including: BNP normal; troponin normal; D-dimer age-adjusted less than 0.63 considered normal.  No indication for CTA of the chest.  Reviewed pertinent lab work and imaging with patient at bedside. Questions answered.   Most current vital signs reviewed and are as follows: BP (!) 169/115   Pulse 85   Temp 98.4 F (36.9 C) (Oral)   Resp 19   SpO2 99%   Plan: Discharge to home.   Prescriptions written for: None  Other home care instructions discussed: Rest, continue treatment for COVID-19  ED return instructions discussed: Worsening shortness of  breath, chest pain, syncope, new symptoms or other concerns  Follow-up instructions discussed: Patient encouraged to follow-up with their PCP in 3 days.                                   Medical Decision Making Amount and/or Complexity of Data Reviewed Labs: ordered. Radiology: ordered.   Patient with shortness of breath and lightheadedness in setting of COVID 19, currently undergoing treatment.  No signs of pneumonia on chest x-ray.  Lab workup is reassuring.  She was screened for PE with D-dimer and this is normal.  BNP not suggestive and exam is not suggestive of heart failure exacerbation.  Patient has known coronary artery disease, no evidence of ACS with nonischemic EKG and normal troponin.  Patient has ambulated without desaturation.  She looks well.  Overall clinical picture consistent with deconditioning due to respiratory infection.  She can follow-up as outpatient.  She seems reliable to return if symptoms worsen.  The patient's vital signs, pertinent lab work and imaging were reviewed and interpreted as discussed in the ED course. Hospitalization was considered for further testing, treatments, or serial exams/observation. However as patient is  well-appearing, has a stable exam, and reassuring studies today, I do not feel that they warrant admission at this time. This plan was discussed with the patient who verbalizes agreement and comfort with this plan and seems reliable and able to return to the Emergency Department with worsening or changing symptoms.            Final Clinical Impression(s) / ED Diagnoses Final diagnoses:  Shortness of breath    Rx / DC Orders ED Discharge Orders     None         Desmond Dike 02/04/23 2206    Gloris Manchester, MD 02/04/23 [phone]

## 2023-02-04 NOTE — ED Notes (Signed)
Pt discharged to home, discharge instructions reviewed with all questions answered. NAD noted at time of discharge

## 2023-02-04 NOTE — Telephone Encounter (Signed)
Initial Comment Caller states she was seen last Monday and she tested pos for covid. She is on medication but she has been experiencing dizziness, she is unable to eat, nausea and she has shortness of breath. GOTO Facility Not Listed uc Translation No Nurse Assessment Nurse: [place], RN, Marchelle Folks Date/Time (Eastern Time): 02/04/2023 2:52:17 PM Confirm and document reason for call. If symptomatic, describe symptoms. ---caller states she was seen last monday and test pos for covid. she is taking the paxlovid. when she stands she is dizzy, lightheaded, nausea. not eating. lost 5 lbs. shortness of breath with exertion. Does the patient have any new or worsening symptoms? ---Yes Will a triage be completed? ---Yes Related visit to physician within the last 2 weeks? ---Yes Does the PT have any chronic conditions? (i.e. diabetes, asthma, this includes High risk factors for pregnancy, etc.) ---Yes List chronic conditions. ---HTN Is this a behavioral health or substance abuse call? ---No Guidelines Guideline Title Affirmed Question Affirmed Notes Nurse Date/Time (Eastern Time) COVID-19 - Diagnosed or Suspected Patient sounds very sick or weak to the triager [place]et, RN, Marchelle Folks 02/04/2023 2:53:53 PM PLEASE NOTE: All timestamps contained within this report are represented as [place] Standard Time. CONFIDENTIALTY NOTICE: This fax transmission is intended only for the addressee. It contains information that is legally privileged, confidential or otherwise protected from use or disclosure. If you are not the intended recipient, you are strictly prohibited from reviewing, disclosing, copying using or disseminating any of this information or taking any action in reliance on or regarding this information. If you have received this fax in error, please notify us immediately by telephone so that we can arrange for its return to [place]. Phone: [phone], Toll-Free: [phone], Fax:  [phone] Page: 2 of 2 Call Id: [id number] Disp. Time Lamount Cohen Time) Disposition Final User 02/04/2023 2:49:57 PM Send to Urgent Queue Lonia Skinner 02/04/2023 3:00:48 PM Go to ED Now (or PCP triage) Yes [place]et, RN, Marchelle Folks Final Disposition 02/04/2023 3:00:48 PM Go to ED Now (or PCP triage) Yes [place], RN, Earnestine Leys Disagree/Comply Comply Caller Understands Yes PreDisposition InappropriateToAsk Care Advice Given Per Guideline GO TO ED NOW (OR PCP TRIAGE): * IF NO PCP (PRIMARY CARE PROVIDER) SECOND-LEVEL TRIAGE: You need to be seen within the next hour. Go to the ED/UCC at _____________ Hospital. Leave as soon as you can. CARE ADVICE given per COVID-19 - DIAGNOSED OR SUSPECTED (Adult) guideline. Comments User: Jaclynn Major, RN Date/Time Lamount Cohen Time): 02/04/2023 3:00:41 PM says she is feeling heart palpitations, no chest pain, no sweating. cannot check bp. sending to UC now. understands to call 911 for chest pain, severe shortness of breath, drenching sweats, feels like she is going to faint or cant stand Referrals GO TO FACILITY OTHER - SPECIFY

## 2023-02-04 NOTE — Telephone Encounter (Signed)
Pt called to schedule her 6 month f/u but also mentioned that she has started feeling better after being diagnosed with covid but still has some SOB and weakness/dizziness when she stands. Transferred to triage to discuss further

## 2023-02-04 NOTE — ED Triage Notes (Signed)
Dx with covid on 7/22. Feeling dizziness and sob since. Seen at UC advice to go to ed for further eval

## 2023-02-04 NOTE — ED Provider Notes (Signed)
HPI  SUBJECTIVE:  Margaret Sims is a 62 y.o. female who presents with lightheadedness/dizziness that is present with getting up and with movement, better with rest.  She reports dyspnea on exertion, but is able to go up a full flight of stairs, occasional cough from complications.  No shortness of breath at rest.  No vertigo, wheezing, chest pain, pressure or heaviness, syncope, presyncope.  No lower extremity edema, unintentional weight gain, nocturia, PND, orthopnea, abdominal pain, calf pain or swelling.  Reports decreased appetite/p.o. intake.  She had symptoms like this before last June when she was found to have syncope.  She has not tried anything for symptoms.  Symptoms are better with sitting and with rest, worse with getting up and with exertion.  She has never had shortness of breath with exertion.  She has a past medical history of diabetes, hypertension, ventricular tachycardia, syncope, coronary disease, hypercholesterolemia.  No history of PE, DVT, CHF, cardiomyopathy.  PCP: [facility].    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 IMPRESSION: Colonoscopy reveals multiple areas of diverticulosis. Will treat for suspected diverticulitis. RTO if sxs worsen or do not improve. gwt 03/05/2012   Dyslipidemia 02/19/2016   Encounter for gynecological examination 12/07/2018   Essential hypertension 02/19/2016   High cholesterol    Hypercholesterolemia 06/23/2013   Formatting of this note might be different from the original. IMPRESSION: The goal is to have your total cholesterol < 200, the HDL (good cholesterol) >40, and the LDL (bad cholesterol) <100.  And triglycerides should be under 150.`E1o3L`It is recommended that you follow a good diet low in animal and dairy fat and exercise for at least 45 minutes a day 5-6 days a  week. `E1o3L`*LDL still high at 14   Obesity 12/07/2018   Syncope and collapse 01/17/2022   Tobacco use disorder 02/19/2016   Type 2 diabetes mellitus with diabetic neuropathy, unspecified (HCC) 05/01/2020   Ventricular tachycardia (HCC) 02/21/2022    Past Surgical History:  Procedure Laterality Date   CESAREAN SECTION      Family History  Problem Relation Age of Onset   Stroke Maternal Grandmother     Social History   Tobacco Use   Smoking status: Former    Current packs/day: 0.00    Types: Cigarettes    Quit date: 09/05/2021    Years since quitting: 1.4   Smokeless tobacco: Never  Vaping Use   Vaping status: Never Used  Substance Use Topics   Alcohol use: Yes    Comment: 1-2 times per week   Drug use: Never    No current facility-administered medications for this encounter.  Current Outpatient Medications:    aspirin 81 MG tablet, Take 81 mg by mouth daily., Disp: , Rfl:    atorvastatin (LIPITOR) 40 MG tablet, TAKE ONE (1) TABLET BY MOUTH EACH DAY, Disp: 90 tablet, Rfl: 1   lisinopril (ZESTRIL) 40 MG tablet, TAKE ONE (1) TABLET BY MOUTH EACH DAY, Disp: 90 tablet, Rfl: 1   metFORMIN (GLUCOPHAGE) 500 MG tablet, Take 1 tablet (500 mg total) by mouth daily., Disp: 90 tablet, Rfl: 1   omeprazole (PRILOSEC) 40 MG capsule, TAKE 1 CAPSULE BY MOUTH DAILY, Disp: 90 capsule, Rfl: 1   promethazine-dextromethorphan (PROMETHAZINE-DM) 6.25-15 MG/5ML syrup, Take 5 mLs by mouth 4 (four) times daily as needed., Disp: 118  mL, Rfl: 0   Semaglutide (RYBELSUS) 3 MG TABS, Take 1 tablet (3 mg total) by mouth daily., Disp: 30 tablet, Rfl: 2  No Known Allergies   ROS  As noted in HPI.   Physical Exam  BP (!) 133/95 (BP Location: Left Arm)   Pulse 96   Temp 98.7 F (37.1 C) (Oral)   Resp 20   SpO2 95%   Constitutional: Well developed, well nourished, no acute distress Eyes:  EOMI, conjunctiva normal bilaterally HENT: Normocephalic, atraumatic,mucus membranes moist Respiratory:  Normal inspiratory effort, lungs clear bilaterally Cardiovascular: Normal rate, regular rhythm, no murmurs rubs or gallops GI: nondistended skin: No rash, skin intact Musculoskeletal: Trace edema bilateral lower extremities.  Calves symmetric, nontender. Neurologic: Alert & oriented x 3, no focal neuro deficits Psychiatric: Speech and behavior appropriate   ED Course   Medications - No data to display  Orders Placed This Encounter  Procedures   ED EKG    Standing Status:   Standing    Number of Occurrences:   1    Order Specific Question:   Reason for Exam    Answer:   Shortness of breath    No results found for this or any previous visit (from the past 24 hour(s)). No results found.  ED Clinical Impression  1. Dyspnea on exertion   2. Dizziness      ED Assessment/Plan     EKG: Normal sinus rhythm, rate 85.  Left axis deviation.  No hypertrophy.  No ST-T wave changes.  T wave inversion in V5 and V6 resolved compared to EKG from May 2024.  Patient presents post COVID infection with shortness of breath on exertion, palpitations, lightheadedness/dizziness with getting up and moving around.  She denies vertigo.  I would not expect dehydration to cause shortness of breath.  The differential is vast including, but not limited to, cardiomyopathy, recurrent arrhythmia, PE, pneumonia.  Transferring to the emergency department for comprehensive evaluation.  I feel that she is stable to go via private vehicle given that her vitals, EKG are normal.  Discussed rationale for transfer to the emergency department with the patient.  She agrees to go.  No orders of the defined types were placed in this encounter.     *This clinic note was created using Dragon dictation software. Therefore, there may be occasional mistakes despite careful proofreading.  ?    Domenick Gong, MD 02/04/23 1731

## 2023-02-04 NOTE — Discharge Instructions (Signed)
I am concerned with your recent COVID infection that you could have something going on with your heart or lungs that is causing your dizziness, palpitations and shortness of breath with exertion.  I am transferring you to the emergency department for a more comprehensive evaluation.  Let them know if your symptoms change, get worse, you start having shortness of breath or chest pain.

## 2023-02-04 NOTE — ED Notes (Signed)
Pt ambulated... Felt lightheaded and dizzy but able to ambulate...  SpO2 98 % the entire time... HR between 96-110...  Provider notified.Marland KitchenMarland Kitchen

## 2023-02-04 NOTE — ED Triage Notes (Signed)
Pt c/o dizziness, lightheaded and SHOB x 8 days-sx started after dx covid 8 days ago-taking paxlovid-pt NAD-steady gait

## 2023-02-05 ENCOUNTER — Encounter (INDEPENDENT_AMBULATORY_CARE_PROVIDER_SITE_OTHER): Payer: Self-pay

## 2023-02-05 NOTE — Telephone Encounter (Signed)
Pt was seen in ER.

## 2023-02-07 ENCOUNTER — Other Ambulatory Visit: Payer: Self-pay

## 2023-02-07 ENCOUNTER — Encounter: Payer: Self-pay | Admitting: Family

## 2023-02-07 ENCOUNTER — Emergency Department (HOSPITAL_BASED_OUTPATIENT_CLINIC_OR_DEPARTMENT_OTHER)

## 2023-02-07 ENCOUNTER — Emergency Department (HOSPITAL_BASED_OUTPATIENT_CLINIC_OR_DEPARTMENT_OTHER)
Admission: EM | Admit: 2023-02-07 | Discharge: 2023-02-07 | Disposition: A | Discharge status: Home / Self Care | Attending: Emergency Medicine | Admitting: Emergency Medicine

## 2023-02-07 ENCOUNTER — Ambulatory Visit: Admitting: Family

## 2023-02-07 ENCOUNTER — Encounter (HOSPITAL_BASED_OUTPATIENT_CLINIC_OR_DEPARTMENT_OTHER): Payer: Self-pay

## 2023-02-07 VITALS — BP 138/90 | HR 114 | Ht 67.0 in | Wt 175.0 lb

## 2023-02-07 DIAGNOSIS — Z8616 Personal history of COVID-19: Secondary | ICD-10-CM | POA: Diagnosis not present

## 2023-02-07 DIAGNOSIS — Z7982 Long term (current) use of aspirin: Secondary | ICD-10-CM | POA: Diagnosis not present

## 2023-02-07 DIAGNOSIS — R0602 Shortness of breath: Secondary | ICD-10-CM | POA: Insufficient documentation

## 2023-02-07 DIAGNOSIS — R42 Dizziness and giddiness: Secondary | ICD-10-CM | POA: Insufficient documentation

## 2023-02-07 DIAGNOSIS — U071 COVID-19: Secondary | ICD-10-CM

## 2023-02-07 LAB — COMPREHENSIVE METABOLIC PANEL
ALT: 12 U/L (ref 0–44)
AST: 23 U/L (ref 15–41)
Albumin: 4 g/dL (ref 3.5–5.0)
Alkaline Phosphatase: 89 U/L (ref 38–126)
Anion gap: 9 (ref 5–15)
BUN: 12 mg/dL (ref 8–23)
CO2: 24 mmol/L (ref 22–32)
Calcium: 9.8 mg/dL (ref 8.9–10.3)
Chloride: 105 mmol/L (ref 98–111)
Creatinine, Ser: 1.19 mg/dL — ABNORMAL HIGH (ref 0.44–1.00)
GFR, Estimated: 51 mL/min — ABNORMAL LOW (ref >60)
Glucose, Bld: 117 mg/dL — ABNORMAL HIGH (ref 70–99)
Potassium: 3.3 mmol/L — ABNORMAL LOW (ref 3.5–5.1)
Sodium: 138 mmol/L (ref 135–145)
Total Bilirubin: 0.4 mg/dL (ref 0.3–1.2)
Total Protein: 7.7 g/dL (ref 6.5–8.1)

## 2023-02-07 LAB — CBC WITH DIFFERENTIAL/PLATELET
Abs Immature Granulocytes: 0.02 10*3/uL (ref 0.00–0.07)
Basophils Absolute: 0 10*3/uL (ref 0.0–0.1)
Basophils Relative: 1 %
Eosinophils Absolute: 0.1 10*3/uL (ref 0.0–0.5)
Eosinophils Relative: 1 %
HCT: 42.2 % (ref 36.0–46.0)
Hemoglobin: 13.6 g/dL (ref 12.0–15.0)
Immature Granulocytes: 0 %
Lymphocytes Relative: 56 %
Lymphs Abs: 4.7 10*3/uL — ABNORMAL HIGH (ref 0.7–4.0)
MCH: 29.8 pg (ref 26.0–34.0)
MCHC: 32.2 g/dL (ref 30.0–36.0)
MCV: 92.5 fL (ref 80.0–100.0)
Monocytes Absolute: 0.6 10*3/uL (ref 0.1–1.0)
Monocytes Relative: 7 %
Neutro Abs: 2.9 10*3/uL (ref 1.7–7.7)
Neutrophils Relative %: 35 %
Platelets: 311 10*3/uL (ref 150–400)
RBC: 4.56 MIL/uL (ref 3.87–5.11)
RDW: 13.3 % (ref 11.5–15.5)
WBC: 8.3 10*3/uL (ref 4.0–10.5)
nRBC: 0 % (ref 0.0–0.2)

## 2023-02-07 LAB — BRAIN NATRIURETIC PEPTIDE: B Natriuretic Peptide: 13.1 pg/mL (ref 0.0–100.0)

## 2023-02-07 LAB — TROPONIN I (HIGH SENSITIVITY): Troponin I (High Sensitivity): 8 ng/L (ref <18)

## 2023-02-07 MED ORDER — IOHEXOL 350 MG/ML SOLN
100.0000 mL | Freq: Once | INTRAVENOUS | Status: AC | PRN
  Administered 2023-02-07: 100 mL via INTRAVENOUS

## 2023-02-07 MED ORDER — ALBUTEROL SULFATE HFA 108 (90 BASE) MCG/ACT IN AERS: 2.0000 | INHALATION_SPRAY | RESPIRATORY_TRACT | Status: DC | PRN

## 2023-02-07 MED ORDER — SODIUM CHLORIDE 0.9 % IV BOLUS
500.0000 mL | Freq: Once | INTRAVENOUS | Status: AC
  Administered 2023-02-07: 500 mL via INTRAVENOUS

## 2023-02-07 NOTE — ED Triage Notes (Addendum)
Patient having shortness of breath that has increased since a Covid dx 7/22. She denied fever or cough. Patients sp02 86% at PCP on 4lpm. No home o2 use.

## 2023-02-07 NOTE — ED Notes (Signed)
Mr. Scifres walked around the nurses' station on room air.  Her lowest O2 saturation=93%

## 2023-02-07 NOTE — Progress Notes (Signed)
Margaret Sims is a 62 y.o. female with the following history as recorded in [employer]:  Patient Active Problem List   Diagnosis Date Noted   COVID-19 01/27/2023   Abdominal bruit 11/12/2022   Coronary artery disease 11/12/2022   Acid reflux 11/01/2022   High cholesterol 11/01/2022   Ventricular tachycardia (HCC) 02/21/2022   Syncope and collapse 01/17/2022   Type 2 diabetes mellitus with diabetic neuropathy, unspecified (HCC) 05/01/2020   Adhesive capsulitis of left shoulder 12/07/2018   Encounter for gynecological examination 12/07/2018   Obesity 12/07/2018   Essential hypertension 02/19/2016   Dyslipidemia 02/19/2016   Tobacco use disorder 02/19/2016   Hypercholesterolemia 06/23/2013   Diverticulitis of colon 03/05/2012    No current facility-administered medications for this visit.   Current Outpatient Medications  Medication Sig Dispense Refill   aspirin 81 MG tablet Take 81 mg by mouth daily.     atorvastatin (LIPITOR) 40 MG tablet TAKE ONE (1) TABLET BY MOUTH EACH DAY 90 tablet 1   lisinopril (ZESTRIL) 40 MG tablet TAKE ONE (1) TABLET BY MOUTH EACH DAY 90 tablet 1   metFORMIN (GLUCOPHAGE) 500 MG tablet Take 1 tablet (500 mg total) by mouth daily. 90 tablet 1   omeprazole (PRILOSEC) 40 MG capsule TAKE 1 CAPSULE BY MOUTH DAILY 90 capsule 1   Facility-Administered Medications Ordered in Other Visits  Medication Dose Route Frequency Provider Last Rate Last Admin   albuterol (VENTOLIN HFA) 108 (90 Base) MCG/ACT inhaler 2 puff  2 puff Inhalation Q2H PRN Horton, Kristie M, DO        Allergies: Patient has no known allergies.  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 IMPRESSION: Colonoscopy reveals multiple areas of diverticulosis. Will treat for suspected diverticulitis. RTO if sxs worsen or do not improve. gwt  03/05/2012   Dyslipidemia 02/19/2016   Encounter for gynecological examination 12/07/2018   Essential hypertension 02/19/2016   High cholesterol    Hypercholesterolemia 06/23/2013   Formatting of this note might be different from the original. IMPRESSION: The goal is to have your total cholesterol < 200, the HDL (good cholesterol) >40, and the LDL (bad cholesterol) <100.  And triglycerides should be under 150.`E1o3L`It is recommended that you follow a good diet low in animal and dairy fat and exercise for at least 45 minutes a day 5-6 days a week. `E1o3L`*LDL still high at 14   Obesity 12/07/2018   Syncope and collapse 01/17/2022   Tobacco use disorder 02/19/2016   Type 2 diabetes mellitus with diabetic neuropathy, unspecified (HCC) 05/01/2020   Ventricular tachycardia (HCC) 02/21/2022    Past Surgical History:  Procedure Laterality Date   CESAREAN SECTION      Family History  Problem Relation Age of Onset   Stroke Maternal Grandmother     Social History   Tobacco Use   Smoking status: Former    Current packs/day: 0.00    Types: Cigarettes    Quit date: 09/05/2021    Years since quitting: 1.4   Smokeless tobacco: Never  Substance Use Topics   Alcohol use: Yes    Comment: 1-2 times per week    Subjective:   Patient was seen today as scheduled ER follow up; immediately upon walking in room, patient had her head resting against wall/ tears running down her face and noted that she was "  having a very hard time breathing." Denied any chest pain; had mentioned that earlier this morning she was so dizzy when trying to fix breakfast that she had to hold on to the sink to keep from passing out.   Objective:  Vitals:   02/07/23 1301  BP: (!) 138/90  Pulse: (!) 114  SpO2: 99%  Weight: 175 lb (79.4 kg)  Height: 5\' 7"  (1.702 m)    General: Well developed, well nourished, in distress  Skin : Warm and dry.  Neurologic: Alert and oriented; speech intact; face symmetrical; moves all  extremities well; CNII-XII intact without focal deficit   Assessment:  1. Shortness of breath     Plan:  Patient was immediately placed on oxygen and rapid response team from ER downstairs was called for immediate assistance; patient was transported to ER for further evaluation; follow up to be determined.   No follow-ups on file.  No orders of the defined types were placed in this encounter.   Requested Prescriptions    No prescriptions requested or ordered in this encounter

## 2023-02-07 NOTE — ED Notes (Signed)
Pt. Is from the PCP office up stairs.  Pt. Reports she gets Short of breath when she does anything that requires activity.  Pt. In no distress while lying in the bed in room 12.

## 2023-02-07 NOTE — ED Provider Notes (Signed)
Care of patient received from prior provider at 7:32 PM, please see their note for complete H/P and care plan.  Received handoff per ED course.  Clinical Course as of 02/07/23 1932  Fri Feb 07, 2023  1535 Stable HO from [facility] of SOB. Rapid Response from clinic upstairs for SOB. Recent COVID. On Paxlovid. Trialing off O2 and PE pending.  [CC]    Clinical Course User Index [CC] Glyn Ade, MD   Reassessment Cross-sectional imaging with no focal pathology. She does have mild atelectasis.  Will start her on incentive spirometry while she is recovering from her COVID.  She is day 10 which is appropriate for mild respiratory symptoms.  Not hypoxic on ambulation trial stable for outpatient care management with strict return precautions reinforced regarding interval worsening.    Glyn Ade, MD 02/07/23 1932

## 2023-02-07 NOTE — ED Provider Notes (Addendum)
EMERGENCY DEPARTMENT AT [facility] Provider Note   CSN: [id number] Arrival date & time: 02/07/23  1333     History  Chief Complaint  Patient presents with   Shortness of Breath    Margaret Sims is a 62 y.o. female.  HPI   62 year old female with past medical history of recent COVID diagnosis about to finish her Paxlovid regimen presents the emergency department with shortness of breath and lightheadedness.  Patient was being seen in the doctor's office upstairs when we were called overhead for an emergency.  She was found short of breath on nasal cannula.  We transferred her to the ER.  Patient states that she has been short of breath, lightheaded and experiencing palpitations for the past couple days.  She has 1 more day left on her Paxlovid regimen.  She denies any cough, productive cough or hemoptysis.  No lower extremity swelling.  No chest pain but she does endorse myalgias and generalized fatigue with decreased appetite.  Home Medications Prior to Admission medications   Medication Sig Start Date End Date Taking? Authorizing Provider  aspirin 81 MG tablet Take 81 mg by mouth daily.    [provider]  atorvastatin (LIPITOR) 40 MG tablet TAKE ONE (1) TABLET BY MOUTH EACH DAY 07/24/22   Copland, [name], MD  lisinopril (ZESTRIL) 40 MG tablet TAKE ONE (1) TABLET BY MOUTH EACH DAY 07/24/22   Copland, [name], MD  metFORMIN (GLUCOPHAGE) 500 MG tablet Take 1 tablet (500 mg total) by mouth daily. 09/03/22   Copland, [name], MD  omeprazole (PRILOSEC) 40 MG capsule TAKE 1 CAPSULE BY MOUTH DAILY 11/22/22   Copland, [name], MD      Allergies    Patient has no known allergies.    Review of Systems   Review of Systems  Constitutional:  Positive for appetite change and fatigue. Negative for fever.  Respiratory:  Positive for shortness of breath.   Cardiovascular:  Positive for palpitations. Negative for chest pain and leg swelling.   Gastrointestinal:  Negative for abdominal pain, diarrhea and vomiting.  Genitourinary:  Negative for flank pain.  Musculoskeletal:  Negative for back pain.  Skin:  Negative for rash.  Neurological:  Positive for light-headedness. Negative for headaches.    Physical Exam Updated Vital Signs BP (!) 144/99   Pulse 85   Temp 97.8 F (36.6 C) (Oral)   Resp 15   Ht 5\' 7"  (1.702 m)   Wt 79.4 kg   SpO2 100%   BMI 27.41 kg/m  Physical Exam Vitals and nursing note reviewed.  Constitutional:      Appearance: Normal appearance. She is ill-appearing.  HENT:     Head: Normocephalic.     Mouth/Throat:     Mouth: Mucous membranes are moist.  Cardiovascular:     Rate and Rhythm: Normal rate.  Pulmonary:     Effort: Pulmonary effort is normal. No respiratory distress.     Breath sounds: Examination of the right-lower field reveals decreased breath sounds. Examination of the left-lower field reveals decreased breath sounds. Decreased breath sounds present. No rales.  Abdominal:     Palpations: Abdomen is soft.     Tenderness: There is no abdominal tenderness.  Musculoskeletal:     Right lower leg: No edema.     Left lower leg: No edema.  Skin:    General: Skin is warm.  Neurological:     Mental Status: She is alert and oriented to person,  place, and time. Mental status is at baseline.  Psychiatric:        Mood and Affect: Mood normal.     ED Results / Procedures / Treatments   Labs (all labs ordered are listed, but only abnormal results are displayed) Labs Reviewed  CBC WITH DIFFERENTIAL/PLATELET - Abnormal; Notable for the following components:      Result Value   Lymphs Abs 4.7 (*)    All other components within normal limits  COMPREHENSIVE METABOLIC PANEL - Abnormal; Notable for the following components:   Potassium 3.3 (*)    Glucose, Bld 117 (*)    Creatinine, Ser 1.19 (*)    GFR, Estimated 51 (*)    All other components within normal limits  BRAIN NATRIURETIC PEPTIDE   TROPONIN I (HIGH SENSITIVITY)    EKG EKG Interpretation Date/Time:  Friday February 07 2023 13:38:18 EDT Ventricular Rate:  98 PR Interval:  150 QRS Duration:  81 QT Interval:  324 QTC Calculation: 414 R Axis:   -29  Text Interpretation: Sinus rhythm Left atrial enlargement Abnormal R-wave progression, early transition Left ventricular hypertrophy Anterior Q waves, possibly due to LVH Baseline wander in lead(s) III aVF V4 Confirmed by Coralee Pesa ([id number]) on 02/07/2023 1:49:21 PM  Radiology No results found.  Procedures .Critical Care  Performed by: Rozelle Logan, DO Authorized by: Rozelle Logan, DO   Critical care provider statement:    Critical care time (minutes):  30   Critical care time was exclusive of:  Separately billable procedures and treating other patients   Critical care was necessary to treat or prevent imminent or life-threatening deterioration of the following conditions:  Respiratory failure   Critical care was time spent personally by me on the following activities:  Development of treatment plan with patient or surrogate, discussions with consultants, evaluation of patient's response to treatment, examination of patient, ordering and review of laboratory studies, ordering and review of radiographic studies, ordering and performing treatments and interventions, pulse oximetry, re-evaluation of patient's condition and review of old charts   I assumed direction of critical care for this patient from another provider in my specialty: no       Medications Ordered in ED Medications  albuterol (VENTOLIN HFA) 108 (90 Base) MCG/ACT inhaler 2 puff (has no administration in time range)  sodium chloride 0.9 % bolus 500 mL (500 mLs Intravenous New Bag/Given 02/07/23 1414)    ED Course/ Medical Decision Making/ A&P                                 Medical Decision Making Amount and/or Complexity of Data Reviewed Labs: ordered. Radiology:  ordered.  Risk Prescription drug management.   62 year old female presents emergency department with shortness of breath, lightheadedness.  Recently COVID-positive, has 1 day left on her Paxlovid regimen.  She is afebrile, at times tachypneic and currently on 2 L nasal cannula which was placed upstairs when she was in doctor's office.  Patient has equal bilateral breath sounds, no lower leg swelling.  Blood work is reassuring without any acute abnormalities.  However she continues to feel short of breath.  A couple days ago was evaluated for similar symptoms and her D-dimer was age-adjusted appropriately.  However with her ongoing symptoms I feel CT PE study is warranted.  Patient signed out to Dr. Doran Durand pending CTPE. Currently stable and will wean [place] O2.  Final Clinical Impression(s) / ED Diagnoses Final diagnoses:  None    Rx / DC Orders ED Discharge Orders     None         Rozelle Logan, DO 02/07/23 1512    Lameeka Schleifer, Clabe Seal, DO 02/07/23 1512

## 2023-02-07 NOTE — ED Notes (Signed)
Pt and husband educated on incentive spirometry and verbalize understanding. Pt able to perform x10 without complication. All questions answered at this time. RT will continue to be available as needed.

## 2023-02-07 NOTE — ED Notes (Signed)
In to see Pt. And assess the Pt. Without oxygen but she still  had O2 on.   RN Earlene Plater took her Oxygen off and will let EDP know that the O2 was on her.  Now will wait 30 mins and let the edp know

## 2023-03-01 NOTE — Progress Notes (Unsigned)
[facility] [address] [place], [place] [address] [phone]  Date:  03/03/2023   Name:  Margaret Sims   DOB:  06-08-1961   MRN:  [id number]  PCP:  Pearline Cables, MD    Chief Complaint: No chief complaint on file.   History of Present Illness:  Margaret Sims is a 62 y.o. very pleasant female patient who presents with the following:  Pt seen today for a recheck visit - history of DM, hypertension, dyslipidemia Last seen by myself in February   Seen by cardiology in May  ASSESSMENT:     1. Essential hypertension   2. Type 2 diabetes mellitus with diabetic neuropathy, without long-term current use of insulin (HCC)   3. Dyslipidemia   4. Tobacco use disorder   5. Class 2 severe obesity due to excess calories with serious comorbidity in adult, unspecified BMI (HCC)   6. Abdominal bruit   7. Coronary artery disease involving native coronary artery of native heart without angina pectoris     PLAN:     In order of problems listed above: Elevated calcium score: Secondary prevention stressed with the patient.  Importance of compliance with diet medication stressed and she vocalized understanding.  She has an excellent exercise protocol and I congratulated her and told to exercise at least 5 times a week and she promises to do so.  Echo and stress test from last year was reviewed with her. Essential hypertension: Blood pressure stable and diet was emphasized Mixed dyslipidemia: On lipid-lowering medications.  She will be back in 6 weeks for liver lipid check.  Goal LDL must be less than 60. Cigarette smoker: I spent 5 minutes with the patient discussing solely about smoking. Smoking cessation was counseled. I suggested to the patient also different medications and pharmacological interventions. Patient is keen to try stopping on its own at this time. He will get back to me if he needs any further assistance in this  matter. Obesity: Weight reduction stressed diet emphasized and she promises to do better.  Risks of obesity explained. Patient will be seen in follow-up appointment in 6 months or earlier if the patient has any concerns.   Lab Results  Component Value Date   HGBA1C 6.8 (H) 09/02/2022   Eye exam Recommend covid and flu this fall  Patient Active Problem List   Diagnosis Date Noted   COVID-19 01/27/2023   Abdominal bruit 11/12/2022   Coronary artery disease 11/12/2022   Acid reflux 11/01/2022   High cholesterol 11/01/2022   Ventricular tachycardia (HCC) 02/21/2022   Syncope and collapse 01/17/2022   Type 2 diabetes mellitus with diabetic neuropathy, unspecified (HCC) 05/01/2020   Adhesive capsulitis of left shoulder 12/07/2018   Encounter for gynecological examination 12/07/2018   Obesity 12/07/2018   Essential hypertension 02/19/2016   Dyslipidemia 02/19/2016   Tobacco use disorder 02/19/2016   Hypercholesterolemia 06/23/2013   Diverticulitis of colon 03/05/2012    Past Medical History:  Diagnosis Date   Acid reflux    Adhesive capsulitis of left shoulder 12/07/2018   Diverticulitis of colon 03/05/2012   Formatting of this note might be different from the original. IMPRESSION: Colonoscopy reveals multiple areas of diverticulosis. Will treat for suspected diverticulitis. RTO if sxs worsen or do not improve. gwt 03/05/2012   Dyslipidemia 02/19/2016   Encounter for gynecological examination 12/07/2018   Essential hypertension 02/19/2016   High cholesterol    Hypercholesterolemia 06/23/2013  Formatting of this note might be different from the original. IMPRESSION: The goal is to have your total cholesterol < 200, the HDL (good cholesterol) >40, and the LDL (bad cholesterol) <100.  And triglycerides should be under 150.`E1o3L`It is recommended that you follow a good diet low in animal and dairy fat and exercise for at least 45 minutes a day 5-6 days a week. `E1o3L`*LDL still high at  14   Obesity 12/07/2018   Syncope and collapse 01/17/2022   Tobacco use disorder 02/19/2016   Type 2 diabetes mellitus with diabetic neuropathy, unspecified (HCC) 05/01/2020   Ventricular tachycardia (HCC) 02/21/2022    Past Surgical History:  Procedure Laterality Date   CESAREAN SECTION      Social History   Tobacco Use   Smoking status: Former    Current packs/day: 0.00    Types: Cigarettes    Quit date: 09/05/2021    Years since quitting: 1.4   Smokeless tobacco: Never  Vaping Use   Vaping status: Never Used  Substance Use Topics   Alcohol use: Yes    Comment: 1-2 times per week   Drug use: Never    Family History  Problem Relation Age of Onset   Stroke Maternal Grandmother     No Known Allergies  Medication list has been reviewed and updated.  Current Outpatient Medications on File Prior to Visit  Medication Sig Dispense Refill   aspirin 81 MG tablet Take 81 mg by mouth daily.     atorvastatin (LIPITOR) 40 MG tablet TAKE ONE (1) TABLET BY MOUTH EACH DAY 90 tablet 1   lisinopril (ZESTRIL) 40 MG tablet TAKE ONE (1) TABLET BY MOUTH EACH DAY 90 tablet 1   metFORMIN (GLUCOPHAGE) 500 MG tablet Take 1 tablet (500 mg total) by mouth daily. 90 tablet 1   omeprazole (PRILOSEC) 40 MG capsule TAKE 1 CAPSULE BY MOUTH DAILY 90 capsule 1   No current facility-administered medications on file prior to visit.    Review of Systems:  .asper   Physical Examination: There were no vitals filed for this visit. There were no vitals filed for this visit. There is no height or weight on file to calculate BMI. Ideal Body Weight:    GEN: no acute distress. HEENT: Atraumatic, Normocephalic.  Ears and Nose: No external deformity. CV: RRR, No M/G/R. No JVD. No thrill. No extra heart sounds. PULM: CTA B, no wheezes, crackles, rhonchi. No retractions. No resp. distress. No accessory muscle use. ABD: S, NT, ND, +BS. No rebound. No HSM. EXTR: No c/c/e PSYCH: Normally interactive.  Conversant.    Assessment and Plan: ***  Signed Abbe Amsterdam, MD

## 2023-03-01 NOTE — Patient Instructions (Incomplete)
Good to see you again today Recommend covid booster and flu shot this fall  I will be in touch with your labs   As we discussed, noticing memory mistakes is very common as we age.  If this is becoming more of a concern to you I can certainly get you set up for a formal evaluation-just let me know!    Otherwise, recommend continued physical exercise, healthy sleep habits, healthy diet, time with family and friends, and perhaps try learning something new such as a language or other new skill-he has excellent brain exercise  Assuming all is well please see me in about 6 months

## 2023-03-03 ENCOUNTER — Encounter: Payer: Self-pay | Admitting: Family Medicine

## 2023-03-03 ENCOUNTER — Ambulatory Visit: Admitting: Family Medicine

## 2023-03-03 VITALS — BP 120/70 | Temp 98.2°F | Resp 18 | Ht 67.0 in | Wt 178.8 lb

## 2023-03-03 DIAGNOSIS — E119 Type 2 diabetes mellitus without complications: Secondary | ICD-10-CM

## 2023-03-03 DIAGNOSIS — Z1329 Encounter for screening for other suspected endocrine disorder: Secondary | ICD-10-CM

## 2023-03-03 DIAGNOSIS — E7800 Pure hypercholesterolemia, unspecified: Secondary | ICD-10-CM | POA: Diagnosis not present

## 2023-03-03 DIAGNOSIS — Z1211 Encounter for screening for malignant neoplasm of colon: Secondary | ICD-10-CM

## 2023-03-03 DIAGNOSIS — K219 Gastro-esophageal reflux disease without esophagitis: Secondary | ICD-10-CM | POA: Diagnosis not present

## 2023-03-03 DIAGNOSIS — Z7984 Long term (current) use of oral hypoglycemic drugs: Secondary | ICD-10-CM | POA: Diagnosis not present

## 2023-03-03 DIAGNOSIS — I10 Essential (primary) hypertension: Secondary | ICD-10-CM

## 2023-03-03 LAB — LIPID PANEL
Cholesterol: 204 mg/dL — ABNORMAL HIGH (ref 0–200)
HDL: 46 mg/dL (ref >39.00)
LDL Cholesterol: 127 mg/dL — ABNORMAL HIGH (ref 0–99)
NonHDL: 157.88
Total CHOL/HDL Ratio: 4
Triglycerides: 153 mg/dL — ABNORMAL HIGH (ref 0.0–149.0)
VLDL: 30.6 mg/dL (ref 0.0–40.0)

## 2023-03-03 LAB — TSH: TSH: 0.98 u[IU]/mL (ref 0.35–5.50)

## 2023-03-03 LAB — BASIC METABOLIC PANEL
BUN: 11 mg/dL (ref 6–23)
CO2: 29 mEq/L (ref 19–32)
Calcium: 9.5 mg/dL (ref 8.4–10.5)
Chloride: 106 mEq/L (ref 96–112)
Creatinine, Ser: 1 mg/dL (ref 0.40–1.20)
GFR: 60.08 mL/min (ref >60.00)
Glucose, Bld: 98 mg/dL (ref 70–99)
Potassium: 4.1 mEq/L (ref 3.5–5.1)
Sodium: 144 mEq/L (ref 135–145)

## 2023-03-03 LAB — HEMOGLOBIN A1C: Hgb A1c MFr Bld: 6.7 % — ABNORMAL HIGH (ref 4.6–6.5)

## 2023-03-03 MED ORDER — METFORMIN HCL 500 MG PO TABS
500.0000 mg | ORAL_TABLET | Freq: Every day | ORAL | 3 refills | Status: DC
Start: 2023-03-03 — End: 2024-03-15

## 2023-03-03 MED ORDER — ATORVASTATIN CALCIUM 40 MG PO TABS
40.0000 mg | ORAL_TABLET | Freq: Every day | ORAL | 3 refills | Status: DC
Start: 2023-03-03 — End: 2024-03-09

## 2023-03-03 MED ORDER — LISINOPRIL 40 MG PO TABS
40.0000 mg | ORAL_TABLET | Freq: Every day | ORAL | 3 refills | Status: DC
Start: 2023-03-03 — End: 2023-10-21

## 2023-03-03 MED ORDER — OMEPRAZOLE 40 MG PO CPDR
40.0000 mg | DELAYED_RELEASE_CAPSULE | Freq: Every day | ORAL | 3 refills | Status: DC
Start: 2023-03-03 — End: 2024-03-18

## 2023-04-29 ENCOUNTER — Ambulatory Visit (AMBULATORY_SURGERY_CENTER)

## 2023-04-29 VITALS — Ht 67.0 in | Wt 170.0 lb

## 2023-04-29 DIAGNOSIS — Z1211 Encounter for screening for malignant neoplasm of colon: Secondary | ICD-10-CM

## 2023-04-29 MED ORDER — ONDANSETRON HCL 4 MG PO TABS
4.0000 mg | ORAL_TABLET | ORAL | 0 refills | Status: DC
Start: 2023-04-29 — End: 2023-09-03

## 2023-04-29 NOTE — Addendum Note (Signed)
Addended by: Jaquelyn Bitter on: 04/29/2023 02:37 PM   Modules accepted: Orders

## 2023-04-29 NOTE — Progress Notes (Signed)

## 2023-05-22 ENCOUNTER — Encounter: Payer: Self-pay | Admitting: Internal Medicine

## 2023-05-22 ENCOUNTER — Ambulatory Visit (AMBULATORY_SURGERY_CENTER): Admitting: Internal Medicine

## 2023-05-22 VITALS — BP 118/88 | HR 70 | Temp 97.4°F | Resp 17 | Ht 67.0 in | Wt 170.0 lb

## 2023-05-22 DIAGNOSIS — K635 Polyp of colon: Secondary | ICD-10-CM | POA: Diagnosis not present

## 2023-05-22 DIAGNOSIS — D120 Benign neoplasm of cecum: Secondary | ICD-10-CM

## 2023-05-22 DIAGNOSIS — D122 Benign neoplasm of ascending colon: Secondary | ICD-10-CM | POA: Diagnosis not present

## 2023-05-22 DIAGNOSIS — Z1211 Encounter for screening for malignant neoplasm of colon: Secondary | ICD-10-CM | POA: Diagnosis present

## 2023-05-22 DIAGNOSIS — D123 Benign neoplasm of transverse colon: Secondary | ICD-10-CM | POA: Diagnosis not present

## 2023-05-22 DIAGNOSIS — D125 Benign neoplasm of sigmoid colon: Secondary | ICD-10-CM

## 2023-05-22 DIAGNOSIS — D128 Benign neoplasm of rectum: Secondary | ICD-10-CM

## 2023-05-22 DIAGNOSIS — D127 Benign neoplasm of rectosigmoid junction: Secondary | ICD-10-CM | POA: Diagnosis not present

## 2023-05-22 MED ORDER — SODIUM CHLORIDE 0.9 % IV SOLN: 500.0000 mL | Freq: Once | INTRAVENOUS | Status: DC

## 2023-05-22 NOTE — Op Note (Signed)
[facility] Patient Name: Margaret Sims Procedure Date: 05/22/2023 1:32 PM MRN: [id number] Endoscopist: Particia Lather , , [id number] Age: 62 Referring MD:  Date of Birth: October 17, 1960 Gender: Female Account #: [id number] Procedure:                Colonoscopy Indications:              Screening for colorectal malignant neoplasm Medicines:                Monitored Anesthesia Care Procedure:                Pre-Anesthesia Assessment:                           - Prior to the procedure, a History and Physical                            was performed, and patient medications and                            allergies were reviewed. The patient's tolerance of                            previous anesthesia was also reviewed. The risks                            and benefits of the procedure and the sedation                            options and risks were discussed with the patient.                            All questions were answered, and informed consent                            was obtained. Prior Anticoagulants: The patient has                            taken no anticoagulant or antiplatelet agents. ASA                            Grade Assessment: II - A patient with mild systemic                            disease. After reviewing the risks and benefits,                            the patient was deemed in satisfactory condition to                            undergo the procedure.                           After obtaining informed consent, the colonoscope  was passed under direct vision. Throughout the                            procedure, the patient's blood pressure, pulse, and                            oxygen saturations were monitored continuously. The                            Olympus Scope SN: [id number] was introduced through                            the anus and advanced to the the terminal ileum.                            The  colonoscopy was performed without difficulty.                            The patient tolerated the procedure well. The                            quality of the bowel preparation was good. The                            terminal ileum, ileocecal valve, appendiceal                            orifice, and rectum were photographed. Scope In: 1:44:09 PM Scope Out: 2:07:12 PM Scope Withdrawal Time: 0 hours 14 minutes 58 seconds  Total Procedure Duration: 0 hours 23 minutes 3 seconds  Findings:                 The terminal ileum appeared normal.                           Multiple diverticula were found in the entire colon.                           Nine sessile polyps were found in the transverse                            colon, ascending colon and cecum. The polyps were 3                            to 6 mm in size. These polyps were removed with a                            cold snare. Resection and retrieval were complete.                           A 4 mm polyp was found in the sigmoid colon. The  polyp was sessile. The polyp was removed with a                            cold snare. Resection and retrieval were complete.                           A 10 mm polyp was found in the rectum. The polyp                            was pedunculated. The polyp was removed with a hot                            snare. Resection and retrieval were complete.                           Non-bleeding internal hemorrhoids were found during                            retroflexion. Complications:            No immediate complications. Estimated Blood Loss:     Estimated blood loss was minimal. Impression:               - The examined portion of the ileum was normal.                           - Diverticulosis in the entire examined colon.                           - Nine 3 to 6 mm polyps in the transverse colon, in                            the ascending colon and in the cecum, removed  with                            a cold snare. Resected and retrieved.                           - One 4 mm polyp in the sigmoid colon, removed with                            a cold snare. Resected and retrieved.                           - One 10 mm polyp in the rectum, removed with a hot                            snare. Resected and retrieved.                           - Non-bleeding internal hemorrhoids. Recommendation:           - Discharge patient to home (with escort).                           -  Await pathology results.                           - The findings and recommendations were discussed                            with the patient. Dr Particia Lather "[name]" Leonides Schanz,  05/22/2023 2:13:29 PM

## 2023-05-22 NOTE — Progress Notes (Signed)
Called to room to assist during endoscopic procedure.  Patient ID and intended procedure confirmed with present staff. Received instructions for my participation in the procedure from the performing physician.  

## 2023-05-22 NOTE — Progress Notes (Signed)
GASTROENTEROLOGY PROCEDURE H&P NOTE   Primary Care Physician: Pearline Cables, MD    Reason for Procedure:   Colon cancer screening  Plan:    Colonoscopy  Patient is appropriate for endoscopic procedure(s) in the ambulatory ([facility]) setting.  The nature of the procedure, as well as the risks, benefits, and alternatives were carefully and thoroughly reviewed with the patient. Ample time for discussion and questions allowed. The patient understood, was satisfied, and agreed to proceed.     HPI: Margaret Sims is a 62 y.o. female who presents for colonoscopy for colon cancer screening. Denies blood in stools, changes in bowel habits, or unintentional weight loss. Denies family history of colon cancer. Last colonoscopy in 2014 was normal.   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 IMPRESSION: Colonoscopy reveals multiple areas of diverticulosis. Will treat for suspected diverticulitis. RTO if sxs worsen or do not improve. gwt 03/05/2012   Dyslipidemia 02/19/2016   Encounter for gynecological examination 12/07/2018   Essential hypertension 02/19/2016   High cholesterol    Hypercholesterolemia 06/23/2013   Formatting of this note might be different from the original. IMPRESSION: The goal is to have your total cholesterol < 200, the HDL (good cholesterol) >40, and the LDL (bad cholesterol) <100.  And triglycerides should be under 150.`E1o3L`It is recommended that you follow a good diet low in animal and dairy fat and exercise for at least 45 minutes a day 5-6 days a week. `E1o3L`*LDL still high at 14   Obesity 12/07/2018   Syncope and collapse 01/17/2022   Tobacco use disorder 02/19/2016   Type 2 diabetes mellitus with diabetic neuropathy, unspecified (HCC) 05/01/2020   Ventricular tachycardia (HCC) 02/21/2022    Past  Surgical History:  Procedure Laterality Date   CESAREAN SECTION      Prior to Admission medications   Medication Sig Start Date End Date Taking? Authorizing Provider  aspirin 81 MG tablet Take 81 mg by mouth daily.    [provider]  atorvastatin (LIPITOR) 40 MG tablet Take 1 tablet (40 mg total) by mouth daily. 03/03/23   Copland, [name], MD  lisinopril (ZESTRIL) 40 MG tablet Take 1 tablet (40 mg total) by mouth daily. 03/03/23   Copland, [name], MD  metFORMIN (GLUCOPHAGE) 500 MG tablet Take 1 tablet (500 mg total) by mouth daily. 03/03/23   Copland, [name], MD  omeprazole (PRILOSEC) 40 MG capsule Take 1 capsule (40 mg total) by mouth daily. 03/03/23   Copland, [name], MD  ondansetron (ZOFRAN) 4 MG tablet Take 1 tablet (4 mg total) by mouth as directed. Take one Zofran 4 mg tablet 30-60 minutes before each colonoscopy prep dose 04/29/23   Imogene Burn, MD    Current Outpatient Medications  Medication Sig Dispense Refill   aspirin 81 MG tablet Take 81 mg by mouth daily.     atorvastatin (LIPITOR) 40 MG tablet Take 1 tablet (40 mg total) by mouth daily. 90 tablet 3   lisinopril (ZESTRIL) 40 MG tablet Take 1 tablet (40 mg total) by mouth daily. 90 tablet 3   metFORMIN (GLUCOPHAGE) 500 MG tablet Take 1 tablet (500 mg total) by mouth daily. 90 tablet 3   omeprazole (PRILOSEC) 40 MG capsule Take 1 capsule (40 mg total) by mouth daily. 90 capsule 3   ondansetron (ZOFRAN)  4 MG tablet Take 1 tablet (4 mg total) by mouth as directed. Take one Zofran 4 mg tablet 30-60 minutes before each colonoscopy prep dose 2 tablet 0   Current Facility-Administered Medications  Medication Dose Route Frequency Provider Last Rate Last Admin   0.9 %  sodium chloride infusion  500 mL Intravenous Once Imogene Burn, MD        Allergies as of 05/22/2023   (No Known Allergies)    Family History  Problem Relation Age of Onset   Stroke Maternal Grandmother    Colon cancer Neg Hx    Colon  polyps Neg Hx    Esophageal cancer Neg Hx    Rectal cancer Neg Hx    Stomach cancer Neg Hx     Social History   Socioeconomic History   Marital status: Married    Spouse name: Not on file   Number of children: Not on file   Years of education: Not on file   Highest education level: Not on file  Occupational History   Not on file  Tobacco Use   Smoking status: Every Day    Current packs/day: 0.00    Types: Cigarettes    Last attempt to quit: 09/05/2021    Years since quitting: 1.7   Smokeless tobacco: Never  Vaping Use   Vaping status: Never Used  Substance and Sexual Activity   Alcohol use: Yes    Comment: 1-2 times per week   Drug use: Never   Sexual activity: Yes    Birth control/protection: Post-menopausal  Other Topics Concern   Not on file  Social History Narrative   Right handed   Social Determinants of Health   Financial Resource Strain: Not on file  Food Insecurity: Not on file  Transportation Needs: Not on file  Physical Activity: Not on file  Stress: Not on file  Social Connections: Not on file  Intimate Partner Violence: Not on file    Physical Exam: Vital signs in last 24 hours: Temp 98 F (36.7 C) (Temporal)  GEN: NAD EYE: Sclerae anicteric ENT: MMM CV: Non-tachycardic Pulm: No increased work of breathing GI: Soft, NT/ND NEURO:  Alert & Oriented   Eulah Pont, MD [facility]  05/22/2023 1:31 PM

## 2023-05-22 NOTE — Progress Notes (Signed)
Report to PACU, RN, vss, BBS= Clear.  

## 2023-05-22 NOTE — Progress Notes (Signed)
Pt's states no medical or surgical changes since previsit or office visit. 

## 2023-05-22 NOTE — Patient Instructions (Signed)
Await pathology results.   Resume previous diet.  Handouts on polyps, diverticulosis, and hemorrhoids provided.  YOU HAD AN ENDOSCOPIC PROCEDURE TODAY AT THE [facility]:   Refer to the procedure report that was given to you for any specific questions about what was found during the examination.  If the procedure report does not answer your questions, please call your gastroenterologist to clarify.  If you requested that your care partner not be given the details of your procedure findings, then the procedure report has been included in a sealed envelope for you to review at your convenience later.  YOU SHOULD EXPECT: Some feelings of bloating in the abdomen. Passage of more gas than usual.  Walking can help get rid of the air that was put into your GI tract during the procedure and reduce the bloating. If you had a lower endoscopy (such as a colonoscopy or flexible sigmoidoscopy) you may notice spotting of blood in your stool or on the toilet paper. If you underwent a bowel prep for your procedure, you may not have a normal bowel movement for a few days.  Please Note:  You might notice some irritation and congestion in your nose or some drainage.  This is from the oxygen used during your procedure.  There is no need for concern and it should clear up in a day or so.  SYMPTOMS TO REPORT IMMEDIATELY:  Following lower endoscopy (colonoscopy or flexible sigmoidoscopy):  Excessive amounts of blood in the stool  Significant tenderness or worsening of abdominal pains  Swelling of the abdomen that is new, acute  Fever of 100F or higher   For urgent or emergent issues, a gastroenterologist can be reached at any hour by calling [phone]. Do not use MyChart messaging for urgent concerns.    DIET:  We do recommend a small meal at first, but then you may proceed to your regular diet.  Drink plenty of fluids but you should avoid alcoholic beverages for 24 hours.  ACTIVITY:  You  should plan to take it easy for the rest of today and you should NOT DRIVE or use heavy machinery until tomorrow (because of the sedation medicines used during the test).    FOLLOW UP: Our staff will call the number listed on your records the next business day following your procedure.  We will call around 7:15- 8:00 am to check on you and address any questions or concerns that you may have regarding the information given to you following your procedure. If we do not reach you, we will leave a message.     If any biopsies were taken you will be contacted by phone or by letter within the next 1-3 weeks.  Please call us at [phone] if you have not heard about the biopsies in 3 weeks.    SIGNATURES/CONFIDENTIALITY: You and/or your care partner have signed paperwork which will be entered into your electronic medical record.  These signatures attest to the fact that that the information above on your After Visit Summary has been reviewed and is understood.  Full responsibility of the confidentiality of this discharge information lies with you and/or your care-partner.

## 2023-05-23 ENCOUNTER — Telehealth: Payer: Self-pay

## 2023-05-23 NOTE — Telephone Encounter (Signed)
  Follow up Call-     05/22/2023    1:06 PM  Call back number  Post procedure Call Back phone  # [phone]  Permission to leave phone message Yes     Patient questions:  Do you have a fever, pain , or abdominal swelling? No. Pain Score  0 *  Have you tolerated food without any problems? Yes.    Have you been able to return to your normal activities? Yes.    Do you have any questions about your discharge instructions: Diet   No. Medications  No. Follow up visit  No.  Do you have questions or concerns about your Care? No.  Actions: * If pain score is 4 or above: No action needed, pain <4.

## 2023-05-27 ENCOUNTER — Encounter: Payer: Self-pay | Admitting: Internal Medicine

## 2023-05-27 LAB — SURGICAL PATHOLOGY

## 2023-06-18 ENCOUNTER — Encounter: Payer: Self-pay | Admitting: Internal Medicine

## 2023-08-30 NOTE — Patient Instructions (Incomplete)
 It was good to see you today, I will be in touch with your labs  Recommend COVID booster if none the last 6 months or so

## 2023-08-30 NOTE — Progress Notes (Unsigned)
 [facility] at [facility] [address], [address] [place], [place] [address] [phone]  Date:  09/03/2023   Name:  Margaret Sims   DOB:  [birth year]   MRN:  [id number]  PCP:  Pearline Cables, MD    Chief Complaint: No chief complaint on file.   History of Present Illness:  Margaret Sims is a 63 y.o. very pleasant female patient who presents with the following:  Patient seen today for periodic follow-up- last seen by myself in August. history of DM, hypertension, dyslipidemia   She is also followed by cardiology-she is having this recently last year  Colonoscopy done in November  Flu vaccine COVID booster Due for urine micro Mammogram Can update foot exam Pap 2022-normal with negative HPV.  Can update today if she would like  Aspirin 81 Lipitor 40 Lisinopril 40 Metformin 500 Omeprazole  Lab Results  Component Value Date   HGBA1C 6.7 (H) 03/03/2023     Patient Active Problem List   Diagnosis Date Noted   COVID-19 01/27/2023   Abdominal bruit 11/12/2022   Coronary artery disease 11/12/2022   Acid reflux 11/01/2022   High cholesterol 11/01/2022   Ventricular tachycardia (HCC) 02/21/2022   Syncope and collapse 01/17/2022   Type 2 diabetes mellitus with diabetic neuropathy, unspecified (HCC) 05/01/2020   Adhesive capsulitis of left shoulder 12/07/2018   Encounter for gynecological examination 12/07/2018   Obesity 12/07/2018   Essential hypertension 02/19/2016   Dyslipidemia 02/19/2016   Tobacco use disorder 02/19/2016   Hypercholesterolemia 06/23/2013   Diverticulitis of colon 03/05/2012    Past Medical History:  Diagnosis Date   Acid reflux    Adhesive capsulitis of left shoulder 12/07/2018   Diverticulitis of colon 03/05/2012   Formatting of this note might be different from the original. IMPRESSION: Colonoscopy reveals multiple areas of diverticulosis. Will treat for suspected diverticulitis. RTO if sxs  worsen or do not improve. gwt 03/05/2012   Dyslipidemia 02/19/2016   Encounter for gynecological examination 12/07/2018   Essential hypertension 02/19/2016   High cholesterol    Hypercholesterolemia 06/23/2013   Formatting of this note might be different from the original. IMPRESSION: The goal is to have your total cholesterol < 200, the HDL (good cholesterol) >40, and the LDL (bad cholesterol) <100.  And triglycerides should be under 150.`E1o3L`It is recommended that you follow a good diet low in animal and dairy fat and exercise for at least 45 minutes a day 5-6 days a week. `E1o3L`*LDL still high at 14   Obesity 12/07/2018   Syncope and collapse 01/17/2022   Tobacco use disorder 02/19/2016   Type 2 diabetes mellitus with diabetic neuropathy, unspecified (HCC) 05/01/2020   Ventricular tachycardia (HCC) 02/21/2022    Past Surgical History:  Procedure Laterality Date   CESAREAN SECTION     COLONOSCOPY  2014   normal    Social History   Tobacco Use   Smoking status: Every Day    Current packs/day: 0.00    Types: Cigarettes    Last attempt to quit: 09/05/2021    Years since quitting: 1.9   Smokeless tobacco: Never  Vaping Use   Vaping status: Never Used  Substance Use Topics   Alcohol use: Yes    Comment: 1-2 times per week   Drug use: Never    Family History  Problem Relation Age of Onset   Stroke Maternal Grandmother    Colon cancer Neg Hx    Colon polyps  Neg Hx    Esophageal cancer Neg Hx    Rectal cancer Neg Hx    Stomach cancer Neg Hx     No Known Allergies  Medication list has been reviewed and updated.  Current Outpatient Medications on File Prior to Visit  Medication Sig Dispense Refill   aspirin 81 MG tablet Take 81 mg by mouth daily.     atorvastatin (LIPITOR) 40 MG tablet Take 1 tablet (40 mg total) by mouth daily. 90 tablet 3   lisinopril (ZESTRIL) 40 MG tablet Take 1 tablet (40 mg total) by mouth daily. 90 tablet 3   metFORMIN (GLUCOPHAGE) 500 MG  tablet Take 1 tablet (500 mg total) by mouth daily. 90 tablet 3   omeprazole (PRILOSEC) 40 MG capsule Take 1 capsule (40 mg total) by mouth daily. 90 capsule 3   ondansetron (ZOFRAN) 4 MG tablet Take 1 tablet (4 mg total) by mouth as directed. Take one Zofran 4 mg tablet 30-60 minutes before each colonoscopy prep dose 2 tablet 0   No current facility-administered medications on file prior to visit.    Review of Systems:  As per HPI- otherwise negative.   Physical Examination: There were no vitals filed for this visit. There were no vitals filed for this visit. There is no height or weight on file to calculate BMI. Ideal Body Weight:    GEN: no acute distress. HEENT: Atraumatic, Normocephalic.  Ears and Nose: No external deformity. CV: RRR, No M/G/R. No JVD. No thrill. No extra heart sounds. PULM: CTA B, no wheezes, crackles, rhonchi. No retractions. No resp. distress. No accessory muscle use. ABD: S, NT, ND, +BS. No rebound. No HSM. EXTR: No c/c/e PSYCH: Normally interactive. Conversant.  Foot exam  Assessment and Plan: ***  Signed Abbe Amsterdam, MD

## 2023-09-03 ENCOUNTER — Ambulatory Visit: Admitting: Family Medicine

## 2023-09-03 VITALS — BP 122/80 | HR 95 | Temp 97.8°F | Resp 18 | Ht 67.0 in | Wt 161.6 lb

## 2023-09-03 DIAGNOSIS — Z23 Encounter for immunization: Secondary | ICD-10-CM | POA: Diagnosis not present

## 2023-09-03 DIAGNOSIS — E119 Type 2 diabetes mellitus without complications: Secondary | ICD-10-CM | POA: Diagnosis not present

## 2023-09-03 DIAGNOSIS — Z1329 Encounter for screening for other suspected endocrine disorder: Secondary | ICD-10-CM

## 2023-09-03 DIAGNOSIS — I10 Essential (primary) hypertension: Secondary | ICD-10-CM | POA: Diagnosis not present

## 2023-09-03 DIAGNOSIS — E559 Vitamin D deficiency, unspecified: Secondary | ICD-10-CM | POA: Diagnosis not present

## 2023-09-03 DIAGNOSIS — E7800 Pure hypercholesterolemia, unspecified: Secondary | ICD-10-CM

## 2023-09-03 DIAGNOSIS — Z1231 Encounter for screening mammogram for malignant neoplasm of breast: Secondary | ICD-10-CM

## 2023-09-03 DIAGNOSIS — M25551 Pain in right hip: Secondary | ICD-10-CM

## 2023-09-04 ENCOUNTER — Encounter (HOSPITAL_BASED_OUTPATIENT_CLINIC_OR_DEPARTMENT_OTHER): Payer: Self-pay

## 2023-09-04 ENCOUNTER — Ambulatory Visit (HOSPITAL_BASED_OUTPATIENT_CLINIC_OR_DEPARTMENT_OTHER)
Admission: RE | Admit: 2023-09-04 | Discharge: 2023-09-04 | Disposition: A | Discharge status: Home / Self Care | Source: Ambulatory Visit | Attending: Family Medicine | Admitting: Family Medicine

## 2023-09-04 ENCOUNTER — Encounter: Payer: Self-pay | Admitting: Family Medicine

## 2023-09-04 DIAGNOSIS — M25551 Pain in right hip: Secondary | ICD-10-CM | POA: Diagnosis present

## 2023-09-04 DIAGNOSIS — Z1231 Encounter for screening mammogram for malignant neoplasm of breast: Secondary | ICD-10-CM | POA: Insufficient documentation

## 2023-09-04 LAB — CBC
HCT: 41.7 % (ref 36.0–46.0)
Hemoglobin: 13.6 g/dL (ref 12.0–15.0)
MCHC: 32.7 g/dL (ref 30.0–36.0)
MCV: 95.5 fL (ref 78.0–100.0)
Platelets: 272 10*3/uL (ref 150.0–400.0)
RBC: 4.36 Mil/uL (ref 3.87–5.11)
RDW: 14.2 % (ref 11.5–15.5)
WBC: 6.7 10*3/uL (ref 4.0–10.5)

## 2023-09-04 LAB — HEMOGLOBIN A1C: Hgb A1c MFr Bld: 6.6 % — ABNORMAL HIGH (ref 4.6–6.5)

## 2023-09-04 LAB — COMPREHENSIVE METABOLIC PANEL
ALT: 8 U/L (ref 0–35)
AST: 16 U/L (ref 0–37)
Albumin: 4.2 g/dL (ref 3.5–5.2)
Alkaline Phosphatase: 92 U/L (ref 39–117)
BUN: 11 mg/dL (ref 6–23)
CO2: 29 meq/L (ref 19–32)
Calcium: 9.6 mg/dL (ref 8.4–10.5)
Chloride: 107 meq/L (ref 96–112)
Creatinine, Ser: 1.25 mg/dL — ABNORMAL HIGH (ref 0.40–1.20)
GFR: 45.8 mL/min — ABNORMAL LOW (ref >60.00)
Glucose, Bld: 116 mg/dL — ABNORMAL HIGH (ref 70–99)
Potassium: 4 meq/L (ref 3.5–5.1)
Sodium: 144 meq/L (ref 135–145)
Total Bilirubin: 0.4 mg/dL (ref 0.2–1.2)
Total Protein: 6.7 g/dL (ref 6.0–8.3)

## 2023-09-04 LAB — MICROALBUMIN / CREATININE URINE RATIO
Creatinine,U: 229 mg/dL
Microalb Creat Ratio: 19.6 mg/g (ref 0.0–30.0)
Microalb, Ur: 4.5 mg/dL — ABNORMAL HIGH (ref 0.0–1.9)

## 2023-09-04 LAB — TSH: TSH: 0.78 u[IU]/mL (ref 0.35–5.50)

## 2023-09-04 LAB — VITAMIN D 25 HYDROXY (VIT D DEFICIENCY, FRACTURES): VITD: 15.56 ng/mL — ABNORMAL LOW (ref 30.00–100.00)

## 2023-09-04 MED ORDER — VITAMIN D3 1.25 MG (50000 UT) PO CAPS: ORAL_CAPSULE | ORAL | 0 refills | Status: DC

## 2023-09-04 NOTE — Addendum Note (Signed)
 Addended by: Pearline Cables on: 09/04/2023 04:42 PM   Modules accepted: Orders

## 2023-09-17 ENCOUNTER — Encounter: Payer: Self-pay | Admitting: Family Medicine

## 2023-09-17 DIAGNOSIS — M25551 Pain in right hip: Secondary | ICD-10-CM

## 2023-09-17 NOTE — Addendum Note (Signed)
 Addended by: Abbe Amsterdam C on: 09/17/2023 02:45 PM   Modules accepted: Orders

## 2023-09-23 NOTE — Telephone Encounter (Signed)
 None Wednesday either, next opening is Thursday. The best option would likely be to see [place] tomorrow.

## 2023-09-23 NOTE — Telephone Encounter (Signed)
 We do not have any open [facility] available tomorrow. Please Advise.

## 2023-09-23 NOTE — Telephone Encounter (Signed)
 Pt scheduled 10 am 09/24/23

## 2023-09-24 ENCOUNTER — Ambulatory Visit: Admitting: Family Medicine

## 2023-09-24 ENCOUNTER — Encounter: Payer: Self-pay | Admitting: Family Medicine

## 2023-09-24 VITALS — BP 170/100 | HR 76 | Temp 97.8°F | Resp 18 | Ht 67.0 in | Wt 160.0 lb

## 2023-09-24 DIAGNOSIS — F411 Generalized anxiety disorder: Secondary | ICD-10-CM | POA: Diagnosis not present

## 2023-09-24 DIAGNOSIS — I10 Essential (primary) hypertension: Secondary | ICD-10-CM

## 2023-09-24 LAB — BASIC METABOLIC PANEL
BUN: 12 mg/dL (ref 6–23)
CO2: 30 meq/L (ref 19–32)
Calcium: 10.2 mg/dL (ref 8.4–10.5)
Chloride: 105 meq/L (ref 96–112)
Creatinine, Ser: 1.04 mg/dL (ref 0.40–1.20)
GFR: 57.09 mL/min — ABNORMAL LOW (ref >60.00)
Glucose, Bld: 92 mg/dL (ref 70–99)
Potassium: 3.8 meq/L (ref 3.5–5.1)
Sodium: 143 meq/L (ref 135–145)

## 2023-09-24 MED ORDER — AMLODIPINE BESYLATE 5 MG PO TABS: 5.0000 mg | ORAL_TABLET | Freq: Every day | ORAL | 5 refills | Status: DC

## 2023-09-24 MED ORDER — FLUOXETINE HCL 20 MG PO CAPS
20.0000 mg | ORAL_CAPSULE | Freq: Every day | ORAL | 3 refills | Status: DC
Start: 2023-09-24 — End: 2024-02-29

## 2023-09-24 NOTE — Progress Notes (Addendum)
 [facility] [address], [address] [place], [place] [address] [phone]  Date:  09/24/2023   Name:  Margaret Sims   DOB:  [birth year]   MRN:  [id number]  PCP:  Pearline Cables, MD    Chief Complaint: Hypertension (Pt has been getting high BP readings at home. She does get HA's over the L eye from time to time. BP before leaving her house was 202/115. )   History of Present Illness:  Margaret Sims is a 63 y.o. very pleasant female patient who presents with the following:  Pt seen today for concern of elevated BP I last saw her in February for routine check up- her BP looked fine at that time  History of DM, hypertension, dyslipidemia  She is also followed by cardiology-most recently seen in May of last year We did a CT coronary 2/24- 98% She reached out to [place] on 3/17- she went to ortho for her shoulder injection and her BP was quite high: Just left from getting my steroid shots for my shoulder. They gave it to me but my blood pressure was really high 202/112 orignal Did again 173/127 L arm 172/126 R arm Should I  be concern, I didn't take my medication before I  went, but I  have taken it every other day before. Can not taken it this morning cause my pressure to be this high. They told me to check in with my primary provider.  Otherwise her BP has looked ok recently- she was not aware it was high until it was checked at cardiology She is using lisionopril 40 mg daily - no change in her medications.  No new supplements or over-the-counter medications No SOB, no chest pain, no unusual HA  No recent use of oral steroids- just the shot she got 2 days ago  She continues to exercise as she normally does - no issues or changes in her exercise tolerance  We did blood work recently- normal thyroid but her GFR was not quite as good as normal for her   She does admit to feeling of anxiety- she does not feel depressed  She has  felt anxious since she had covid in 2021 approx  Her family wonders if anxiety could be contributing to her blood pressure.  She would be willing to try some medication for same   BP Readings from Last 3 Encounters:  09/24/23 (!) 170/100  09/03/23 122/80  05/22/23 118/88   Cardiology note from 5/24: Elevated calcium score: Secondary prevention stressed with the patient.  Importance of compliance with diet medication stressed and she vocalized understanding.  She has an excellent exercise protocol and I congratulated her and told to exercise at least 5 times a week and she promises to do so.  Echo and stress test from last year was reviewed with her. Essential hypertension: Blood pressure stable and diet was emphasized Mixed dyslipidemia: On lipid-lowering medications.  She will be back in 6 weeks for liver lipid check.  Goal LDL must be less than 60. Cigarette smoker: I spent 5 minutes with the patient discussing solely about smoking. Smoking cessation was counseled. I suggested to the patient also different medications and pharmacological interventions. Patient is keen to try stopping on its own at this time. He will get back to me if he needs any further assistance in this matter. Obesity: Weight reduction stressed diet emphasized and she promises to do better.  Risks of obesity explained. Patient will be seen in follow-up appointment in 6 months or earlier if the patient has any concerns.   Patient Active Problem List   Diagnosis Date Noted   COVID-19 01/27/2023   Abdominal bruit 11/12/2022   Coronary artery disease 11/12/2022   Acid reflux 11/01/2022   High cholesterol 11/01/2022   Ventricular tachycardia (HCC) 02/21/2022   Syncope and collapse 01/17/2022   Type 2 diabetes mellitus with diabetic neuropathy, unspecified (HCC) 05/01/2020   Adhesive capsulitis of left shoulder 12/07/2018   Encounter for gynecological examination 12/07/2018   Obesity 12/07/2018   Essential hypertension  02/19/2016   Dyslipidemia 02/19/2016   Tobacco use disorder 02/19/2016   Hypercholesterolemia 06/23/2013   Diverticulitis of colon 03/05/2012    Past Medical History:  Diagnosis Date   Acid reflux    Adhesive capsulitis of left shoulder 12/07/2018   Diverticulitis of colon 03/05/2012   Formatting of this note might be different from the original. IMPRESSION: Colonoscopy reveals multiple areas of diverticulosis. Will treat for suspected diverticulitis. RTO if sxs worsen or do not improve. gwt 03/05/2012   Dyslipidemia 02/19/2016   Encounter for gynecological examination 12/07/2018   Essential hypertension 02/19/2016   High cholesterol    Hypercholesterolemia 06/23/2013   Formatting of this note might be different from the original. IMPRESSION: The goal is to have your total cholesterol < 200, the HDL (good cholesterol) >40, and the LDL (bad cholesterol) <100.  And triglycerides should be under 150.`E1o3L`It is recommended that you follow a good diet low in animal and dairy fat and exercise for at least 45 minutes a day 5-6 days a week. `E1o3L`*LDL still high at 14   Obesity 12/07/2018   Syncope and collapse 01/17/2022   Tobacco use disorder 02/19/2016   Type 2 diabetes mellitus with diabetic neuropathy, unspecified (HCC) 05/01/2020   Ventricular tachycardia (HCC) 02/21/2022    Past Surgical History:  Procedure Laterality Date   CESAREAN SECTION     COLONOSCOPY  2014   normal    Social History   Tobacco Use   Smoking status: Every Day    Current packs/day: 0.00    Types: Cigarettes    Last attempt to quit: 09/05/2021    Years since quitting: 2.0   Smokeless tobacco: Never  Vaping Use   Vaping status: Never Used  Substance Use Topics   Alcohol use: Yes    Comment: 1-2 times per week   Drug use: Never    Family History  Problem Relation Age of Onset   Stroke Maternal Grandmother    Colon cancer Neg Hx    Colon polyps Neg Hx    Esophageal cancer Neg Hx    Rectal cancer  Neg Hx    Stomach cancer Neg Hx     No Known Allergies  Medication list has been reviewed and updated.  Current Outpatient Medications on File Prior to Visit  Medication Sig Dispense Refill   aspirin 81 MG tablet Take 81 mg by mouth daily.     atorvastatin (LIPITOR) 40 MG tablet Take 1 tablet (40 mg total) by mouth daily. 90 tablet 3   Cholecalciferol (VITAMIN D3) 1.25 MG (50000 UT) CAPS Take 1 weekly for 12 weeks 12 capsule 0   lisinopril (ZESTRIL) 40 MG tablet Take 1 tablet (40 mg total) by mouth daily. 90 tablet 3   metFORMIN (GLUCOPHAGE) 500 MG tablet Take 1 tablet (500 mg total) by mouth daily. 90 tablet 3   omeprazole (PRILOSEC) 40 MG capsule  Take 1 capsule (40 mg total) by mouth daily. 90 capsule 3   No current facility-administered medications on file prior to visit.    Review of Systems:  As per HPI- otherwise negative.   Physical Examination: Vitals:   09/24/23 0952 09/24/23 1244  BP: (!) 172/102 (!) 170/100  Pulse: 76   Resp: 18   Temp: 97.8 F (36.6 C)   SpO2: 99%    Vitals:   09/24/23 0952  Weight: 160 lb (72.6 kg)  Height: 5\' 7"  (1.702 m)   Body mass index is 25.06 kg/m. Ideal Body Weight: Weight in (lb) to have BMI = 25: 159.3  GEN: no acute distress.  Normal weight, looks well HEENT: Atraumatic, Normocephalic.  Ears and Nose: No external deformity. CV: RRR, No M/G/R. No JVD. No thrill. No extra heart sounds. PULM: CTA B, no wheezes, crackles, rhonchi. No retractions. No resp. distress. No accessory muscle use. ABD: S, NT, ND. No rebound. No HSM. EXTR: No c/c/e PSYCH: Normally interactive. Conversant.   EKG: Sinus rhythm, possible LVH Compared with tracing from 8/24 no significant change noted   Stress myoview about 2 years ago:  Patient exercised for 7 minutes and 30 seconds on the Bruce protocol achieving 9.3 METS of activity.  Normal blood pressure response.   Baseline ECG showed sinus rhythm.  During stress, rare PACs noted.  No adverse  arrhythmias.   Perfusion study shows no significant defects at rest or stress.  No evidence of ischemia or infarction.   Ejection fraction calculated 46% with mild global hypokinesis.   Intermediate risk test secondary to mildly reduced ejection fraction of 46%.  Assessment and Plan: Accelerated hypertension - Plan: EKG 12-Lead, amLODipine (NORVASC) 5 MG tablet, Basic metabolic panel  GAD (generalized anxiety disorder) - Plan: FLUoxetine (PROZAC) 20 MG capsule  Patient seen today with concern of significantly elevated blood pressure over baseline.  She has previously been well-controlled on lisinopril 40.  She has no particular symptoms, no changes in her routine or lifestyle that she can recall.  Will have her add amlodipine 5 mg and check a BMP.  She will let me know how her blood pressure responds over the next several days  Also sent message to her cardiologist-it may be time for recheck visit  She also notes anxiety symptoms for a few years, would be interested in trying medication for same.  Prescribed fluoxetine 20 mg, she will let me know how this works for her.  Discussed most common side effects  Signed Abbe Amsterdam, MD  Received labs, message to pt  Results for orders placed or performed in visit on 09/24/23  Basic metabolic panel   Collection Time: 09/24/23 10:32 AM  Result Value Ref Range   Sodium 143 135 - 145 mEq/L   Potassium 3.8 3.5 - 5.1 mEq/L   Chloride 105 96 - 112 mEq/L   CO2 30 19 - 32 mEq/L   Glucose, Bld 92 70 - 99 mg/dL   BUN 12 6 - 23 mg/dL   Creatinine, Ser 8.41 0.40 - 1.20 mg/dL   GFR 32.44 (L) >01.02 mL/min   Calcium 10.2 8.4 - 10.5 mg/dL

## 2023-09-24 NOTE — Patient Instructions (Signed)
 Good to see you today!  Let's add amlodipine 5 to your BP medication routine Please let me know how this shakes out over the next few days  For anxiety- let's try adding fluoxetine (generic prozac) 20 mg daily.   However wait a few days to start it so we can see if any side effects from the amlodipine.  Let me know how this works for you also; we hope to see a gradual lessening of anxiety over the next several weeks

## 2023-10-02 ENCOUNTER — Encounter: Payer: Self-pay | Admitting: Physician Assistant

## 2023-10-02 ENCOUNTER — Ambulatory Visit (INDEPENDENT_AMBULATORY_CARE_PROVIDER_SITE_OTHER): Admitting: Physician Assistant

## 2023-10-02 ENCOUNTER — Other Ambulatory Visit (INDEPENDENT_AMBULATORY_CARE_PROVIDER_SITE_OTHER)

## 2023-10-02 DIAGNOSIS — M5441 Lumbago with sciatica, right side: Secondary | ICD-10-CM

## 2023-10-02 DIAGNOSIS — M1611 Unilateral primary osteoarthritis, right hip: Secondary | ICD-10-CM | POA: Diagnosis not present

## 2023-10-02 DIAGNOSIS — G8929 Other chronic pain: Secondary | ICD-10-CM

## 2023-10-02 MED ORDER — CYCLOBENZAPRINE HCL 5 MG PO TABS: 10.0000 mg | ORAL_TABLET | Freq: Every day | ORAL | 0 refills | Status: DC

## 2023-10-02 NOTE — Progress Notes (Signed)
 Office Visit Note   Patient: Margaret Sims           Date of Birth: Oct 25, 1960           MRN: [id number] Visit Date: 10/02/2023              Requested by: Pearline Cables, MD [address] [place],  [place] [address] PCP: Pearline Cables, MD   Assessment & Plan: Visit Diagnoses:  1. Chronic midline low back pain with right-sided sciatica     Plan: Given the patient's hemoglobin A1c of 6.6 and recent bilateral shoulder injections just a week ago would not recommend any steroid or steroid injections.  Therefore we will have her go to therapy for back for core strengthening, home exercise program stretching and modalities.  See her back in 4 to 6 weeks see how she is doing overall.  She may benefit from a right hip intra-articular injection.  Questions were encouraged and answered at length.  We did give her some Flexeril to hopefully afford her some rest at night.  Follow-Up Instructions: Return in about 4 weeks (around 10/30/2023).   Orders:  Orders Placed This Encounter  Procedures   XR Lumbar Spine 2-3 Views   No orders of the defined types were placed in this encounter.     Procedures: No procedures performed   Clinical Data: No additional findings.   Subjective: Chief Complaint  Patient presents with   Right Hip - Pain    HPI This 7 63 year old female were seen for the first time for right groin and thigh pain.  Denies any numbness tingling down the leg describes it as a nagging pain.  Also complaining of low back pain particularly on the right.  No recent falls or injuries.  She has had no back surgery.  No significant history of back pain.  She does note some bladder incontinence that is developed over the last year.  No waking pain.  No saddle anesthesia like symptoms.  She is also 20 to 25 pounds but she has been trying to lose weight.  She has no difficulty donning shoes or socks.  Recently had both shoulders injected.  She is  borderline diabetic with a hemoglobin A1c of 6.6.  She is taking Tylenol for the pain. AP pelvis with right hip views dated 09/17/2023 was reviewed personally.  Shows no acute fractures.  Both hips well located.  Right hip with mild subchondral cystic changes.  No evidence of AVN.  Moderate acetabular spurring.  Moderate joint space narrowing.  Review of Systems See HPI otherwise negative  Objective: Vital Signs: There were no vitals taken for this visit.  Physical Exam General: Well-developed well-nourished female no acute distress ambulates without any assistive device. Psych: Alert and oriented x 3 Respirations unlabored Vascular dorsal pedal pulses are 2+ and equal symmetric. Ortho Exam Lower extremities 5 out of 5 strength throughout the lower extremities negative straight leg raise bilaterally.  She has good forward flexion and comes within an inch of touching her toes.  Extension causes no pain.  She has tenderness along the right lower lumbar paraspinous region.  Bilateral hips good range of motion of both hips however internal rotation right hip causes significant pain patient helps out.  No tenderness over the trochanteric region of either hip. Specialty Comments:  No specialty comments available.  Imaging: XR Lumbar Spine 2-3 Views Result Date: 10/02/2023 Lumbar spine 2 views: Slight loss of lordotic curvature.  No spondylolisthesis.  No acute fractures.  Degenerative disc disease with anterior osteophytes at multiple levels.  Loss of disc space at L3-4 and slight loss of disc space at L4-5.  Aortic arthrosclerosis noted.    PMFS History: Patient Active Problem List   Diagnosis Date Noted   COVID-19 01/27/2023   Abdominal bruit 11/12/2022   Coronary artery disease 11/12/2022   Acid reflux 11/01/2022   High cholesterol 11/01/2022   Ventricular tachycardia (HCC) 02/21/2022   Syncope and collapse 01/17/2022   Type 2 diabetes mellitus with diabetic neuropathy, unspecified  (HCC) 05/01/2020   Adhesive capsulitis of left shoulder 12/07/2018   Encounter for gynecological examination 12/07/2018   Obesity 12/07/2018   Essential hypertension 02/19/2016   Dyslipidemia 02/19/2016   Tobacco use disorder 02/19/2016   Hypercholesterolemia 06/23/2013   Diverticulitis of colon 03/05/2012   Past Medical History:  Diagnosis Date   Acid reflux    Adhesive capsulitis of left shoulder 12/07/2018   Diverticulitis of colon 03/05/2012   Formatting of this note might be different from the original. IMPRESSION: Colonoscopy reveals multiple areas of diverticulosis. Will treat for suspected diverticulitis. RTO if sxs worsen or do not improve. gwt 03/05/2012   Dyslipidemia 02/19/2016   Encounter for gynecological examination 12/07/2018   Essential hypertension 02/19/2016   High cholesterol    Hypercholesterolemia 06/23/2013   Formatting of this note might be different from the original. IMPRESSION: The goal is to have your total cholesterol < 200, the HDL (good cholesterol) >40, and the LDL (bad cholesterol) <100.  And triglycerides should be under 150.`E1o3L`It is recommended that you follow a good diet low in animal and dairy fat and exercise for at least 45 minutes a day 5-6 days a week. `E1o3L`*LDL still high at 14   Obesity 12/07/2018   Syncope and collapse 01/17/2022   Tobacco use disorder 02/19/2016   Type 2 diabetes mellitus with diabetic neuropathy, unspecified (HCC) 05/01/2020   Ventricular tachycardia (HCC) 02/21/2022    Family History  Problem Relation Age of Onset   Stroke Maternal Grandmother    Colon cancer Neg Hx    Colon polyps Neg Hx    Esophageal cancer Neg Hx    Rectal cancer Neg Hx    Stomach cancer Neg Hx     Past Surgical History:  Procedure Laterality Date   CESAREAN SECTION     COLONOSCOPY  2014   normal   Social History   Occupational History   Not on file  Tobacco Use   Smoking status: Every Day    Current packs/day: 0.00    Types:  Cigarettes    Last attempt to quit: 09/05/2021    Years since quitting: 2.0   Smokeless tobacco: Never  Vaping Use   Vaping status: Never Used  Substance and Sexual Activity   Alcohol use: Yes    Comment: 1-2 times per week   Drug use: Never   Sexual activity: Yes    Birth control/protection: Post-menopausal

## 2023-10-03 NOTE — Patient Instructions (Signed)
 Good to see you again today

## 2023-10-03 NOTE — Progress Notes (Signed)
 [facility] [address], [address] [place], [place] [address] [phone]  Date:  10/06/2023   Name:  Margaret Sims   DOB:  [birth year]   MRN:  [id number]  PCP:  Pearline Cables, MD    Chief Complaint: Follow-up (152/102 this morning "almost everyday" )   History of Present Illness:  Margaret Sims is a 63 y.o. very pleasant female patient who presents with the following:  Pt seen today for a BP recheck Last seen by myself on 3/19-  History of DM, hypertension, dyslipidemia  She is also followed by cardiology-most recently seen in May of last year We did a CT coronary 2/24- 98%  She was noted to have elevated BP at a recent ortho visit for a shoulder injection We added amlodipine at her last visit with me about 10 days ago: Accelerated hypertension - Plan: EKG 12-Lead, amLODipine (NORVASC) 5 MG tablet, Basic metabolic panel GAD (generalized anxiety disorder) - Plan: FLUoxetine (PROZAC) 20 MG capsule Patient seen today with concern of significantly elevated blood pressure over baseline.  She has previously been well-controlled on lisinopril 40.  She has no particular symptoms, no changes in her routine or lifestyle that she can recall.  Will have her add amlodipine 5 mg and check a BMP.  She will let me know how her blood pressure responds over the next several days Also sent message to her cardiologist-it may be time for recheck visit She also notes anxiety symptoms for a few years, would be interested in trying medication for same.  Prescribed fluoxetine 20 mg, she will let me know how this works for her.  Discussed most common side effects  BP Readings from Last 3 Encounters:  10/06/23 122/74  09/24/23 (!) 170/100  09/03/23 122/80    Wt Readings from Last 3 Encounters:  10/06/23 155 lb (70.3 kg)  09/24/23 160 lb (72.6 kg)  09/03/23 161 lb 9.6 oz (73.3 kg)   She notes her home BP has still run a but higher-  150s/90s.    She is taking 10 mg of amlodipine 10 the last 3 days   She last saw cardiology in May of 2024- will reach out to them about setting up a visit  Patient Active Problem List   Diagnosis Date Noted   COVID-19 01/27/2023   Abdominal bruit 11/12/2022   Coronary artery disease 11/12/2022   Acid reflux 11/01/2022   High cholesterol 11/01/2022   Ventricular tachycardia (HCC) 02/21/2022   Syncope and collapse 01/17/2022   Type 2 diabetes mellitus with diabetic neuropathy, unspecified (HCC) 05/01/2020   Adhesive capsulitis of left shoulder 12/07/2018   Encounter for gynecological examination 12/07/2018   Obesity 12/07/2018   Essential hypertension 02/19/2016   Dyslipidemia 02/19/2016   Tobacco use disorder 02/19/2016   Hypercholesterolemia 06/23/2013   Diverticulitis of colon 03/05/2012    Past Medical History:  Diagnosis Date   Acid reflux    Adhesive capsulitis of left shoulder 12/07/2018   Diverticulitis of colon 03/05/2012   Formatting of this note might be different from the original. IMPRESSION: Colonoscopy reveals multiple areas of diverticulosis. Will treat for suspected diverticulitis. RTO if sxs worsen or do not improve. gwt 03/05/2012   Dyslipidemia 02/19/2016   Encounter for gynecological examination 12/07/2018   Essential hypertension 02/19/2016   High cholesterol    Hypercholesterolemia 06/23/2013   Formatting of this note might be different from the original. IMPRESSION: The goal is to  have your total cholesterol < 200, the HDL (good cholesterol) >40, and the LDL (bad cholesterol) <100.  And triglycerides should be under 150.`E1o3L`It is recommended that you follow a good diet low in animal and dairy fat and exercise for at least 45 minutes a day 5-6 days a week. `E1o3L`*LDL still high at 14   Obesity 12/07/2018   Syncope and collapse 01/17/2022   Tobacco use disorder 02/19/2016   Type 2 diabetes mellitus with diabetic neuropathy, unspecified (HCC) 05/01/2020    Ventricular tachycardia (HCC) 02/21/2022    Past Surgical History:  Procedure Laterality Date   CESAREAN SECTION     COLONOSCOPY  2014   normal    Social History   Tobacco Use   Smoking status: Every Day    Current packs/day: 0.00    Types: Cigarettes    Last attempt to quit: 09/05/2021    Years since quitting: 2.0   Smokeless tobacco: Never  Vaping Use   Vaping status: Never Used  Substance Use Topics   Alcohol use: Yes    Comment: 1-2 times per week   Drug use: Never    Family History  Problem Relation Age of Onset   Stroke Maternal Grandmother    Colon cancer Neg Hx    Colon polyps Neg Hx    Esophageal cancer Neg Hx    Rectal cancer Neg Hx    Stomach cancer Neg Hx     No Known Allergies  Medication list has been reviewed and updated.  Current Outpatient Medications on File Prior to Visit  Medication Sig Dispense Refill   amLODipine (NORVASC) 5 MG tablet Take 1 tablet (5 mg total) by mouth daily. (Patient taking differently: Take 5 mg by mouth daily. 10 mg) 30 tablet 5   aspirin 81 MG tablet Take 81 mg by mouth daily.     atorvastatin (LIPITOR) 40 MG tablet Take 1 tablet (40 mg total) by mouth daily. 90 tablet 3   Cholecalciferol (VITAMIN D3) 1.25 MG (50000 UT) CAPS Take 1 weekly for 12 weeks 12 capsule 0   cyclobenzaprine (FLEXERIL) 5 MG tablet Take 2 tablets (10 mg total) by mouth at bedtime. 30 tablet 0   FLUoxetine (PROZAC) 20 MG capsule Take 1 capsule (20 mg total) by mouth daily. 90 capsule 3   lisinopril (ZESTRIL) 40 MG tablet Take 1 tablet (40 mg total) by mouth daily. 90 tablet 3   metFORMIN (GLUCOPHAGE) 500 MG tablet Take 1 tablet (500 mg total) by mouth daily. 90 tablet 3   omeprazole (PRILOSEC) 40 MG capsule Take 1 capsule (40 mg total) by mouth daily. 90 capsule 3   No current facility-administered medications on file prior to visit.    Review of Systems:  As per HPI- otherwise negative.   Physical Examination: Vitals:   10/06/23 1005   BP: 122/74  Pulse: 89  SpO2: 100%   Vitals:   10/06/23 1005  Weight: 155 lb (70.3 kg)  Height: 5\' 7"  (1.702 m)   Body mass index is 24.28 kg/m. Ideal Body Weight: Weight in (lb) to have BMI = 25: 159.3  GEN: no acute distress.  Normal weight, looks well  HEENT: Atraumatic, Normocephalic.  Ears and Nose: No external deformity. CV: RRR, No M/G/R. No JVD. No thrill. No extra heart sounds. PULM: CTA B, no wheezes, crackles, rhonchi. No retractions. No resp. distress. No accessory muscle use. EXTR: No c/c/e PSYCH: Normally interactive. Conversant.    Assessment and Plan: Accelerated hypertension - Plan: amLODipine (NORVASC) 10  MG tablet Pt seen today for recheck BP; she recently went up to 10 mg of amlodipine and her BP looks great.  Continue current regimen She plans to continue following her BP at home She has not yet started her fluoxetine- has elected to wait until she returns from an upcoming vacation   Signed Abbe Amsterdam, MD

## 2023-10-06 ENCOUNTER — Other Ambulatory Visit: Payer: Self-pay | Admitting: Radiology

## 2023-10-06 ENCOUNTER — Encounter: Payer: Self-pay | Admitting: Family Medicine

## 2023-10-06 ENCOUNTER — Ambulatory Visit: Admitting: Family Medicine

## 2023-10-06 DIAGNOSIS — G8929 Other chronic pain: Secondary | ICD-10-CM

## 2023-10-06 DIAGNOSIS — I10 Essential (primary) hypertension: Secondary | ICD-10-CM

## 2023-10-06 MED ORDER — AMLODIPINE BESYLATE 10 MG PO TABS: 10.0000 mg | ORAL_TABLET | Freq: Every day | ORAL | 3 refills | Status: AC

## 2023-10-07 ENCOUNTER — Other Ambulatory Visit

## 2023-10-21 ENCOUNTER — Other Ambulatory Visit: Payer: Self-pay | Admitting: Family

## 2023-10-21 MED ORDER — LOSARTAN POTASSIUM 50 MG PO TABS: 50.0000 mg | ORAL_TABLET | Freq: Every day | ORAL | 3 refills | Status: DC

## 2023-10-23 ENCOUNTER — Telehealth: Payer: Self-pay

## 2023-10-23 NOTE — Telephone Encounter (Signed)
 Called her back- the allergic reaction is all cleared up Will have her avoid all ace/ arb going forward She is on her amlodipine still - 10 mg  Will check her BP at home twice a day until our appt on Monday- she will bring a list of readings with her  If any very high BP in the interim she will reach out to me   Pulse Readings from Last 3 Encounters:  10/06/23 89  09/24/23 76  09/03/23 95   BP Readings from Last 3 Encounters:  10/06/23 122/74  09/24/23 (!) 170/100  09/03/23 122/80

## 2023-10-23 NOTE — Telephone Encounter (Signed)
 Copied from CRM [id number]. Topic: Clinical - Medical Advice >> Oct 23, 2023 11:16 AM Luane Rumps D wrote: Reason for CRM:  Patient was taking lisinopril for her high blood pressure and had a reaction where her tongue swelled up, She was on a cruise ship at the time and was told to stop taking it. Patient has not taken it since, she on a new med for blood pressure as of 2 weeks ago. amLODipine (NORVASC) 10 MG tablet. Wondering if it's an issue for her to no longer be taking lisinopril.  BP 138/94 readings  Checks BP once a day no headaches, occasional lightheadedness.  Pt stated that he is not having weakness or numbness of face, arms or legs on 1 side of the body, chest pain or SOB. Denies headache. Pt asking if amlodipine dose can be increased before appt on Monday? Looked for appt for today but none available.  Advised pt to continue taking her BP daily and prn when she is having any dizziness or lightheadedness or headache. Advised her to go to ED or call 911 for any worsening sx: chest pain, SOB, weakness or numbness of face or arms or legs on 1 side of the body. Pt verbalized understanding.

## 2023-10-23 NOTE — Addendum Note (Signed)
 Addended by: Gates Kasal C on: 10/23/2023 04:52 PM   Modules accepted: Orders

## 2023-10-24 NOTE — Progress Notes (Unsigned)
 Margaret Sims at [facility] Margaret Sims [address] [place], [place] [address] [phone]  Date:  10/27/2023   Name:  Margaret Sims   DOB:  [birth year]   MRN:  [id number]  PCP:  Kaylee Partridge, MD    Chief Complaint: No chief complaint on file.   History of Present Illness:  Margaret Sims is a 63 y.o. very pleasant female patient who presents with the following:  History of well-controlled diabetes, hypertension, dyslipidemia, elevated coronary calcium  Patient seen today with concern about possible allergic reaction.  I saw her most recently on March 19-she had been to see her orthopedist and they noted high blood pressure. At that time she was taking lisinopril  40, I had amlodipine  5 mg and also fluoxetine  for anxiety She does also have cardiology follow-up-reassuring stress Myoview  about 2 years ago I then saw her again on March 31-she had increased her amlodipine  to 10 mg as blood pressure was still a bit elevated.  At our visit on March 31 her blood pressure was good  She then went on a vacation-she took a cruise While she was on the trip she developed symptoms of possible angioedema.  The cruise position had her stop her ACE inhibitor, she has continued her amlodipine  We touch base via MyChart, I asked her to monitor blood pressures at home so we can see how she responded to just amlodipine .  Will want to avoid ACE inhibitors and ARB's going forward She has some minor renal dysfunction which may limit diuretic use.  Lab Results  Component Value Date   HGBA1C 6.6 (H) 09/03/2023    Patient Active Problem List   Diagnosis Date Noted   COVID-19 01/27/2023   Abdominal bruit 11/12/2022   Coronary artery disease 11/12/2022   Acid reflux 11/01/2022   High cholesterol 11/01/2022   Ventricular tachycardia (HCC) 02/21/2022   Syncope and collapse 01/17/2022   Type 2 diabetes mellitus with diabetic neuropathy, unspecified (HCC) 05/01/2020    Adhesive capsulitis of left shoulder 12/07/2018   Encounter for gynecological examination 12/07/2018   Obesity 12/07/2018   Essential hypertension 02/19/2016   Dyslipidemia 02/19/2016   Tobacco use disorder 02/19/2016   Hypercholesterolemia 06/23/2013   Diverticulitis of colon 03/05/2012    Past Medical History:  Diagnosis Date   Acid reflux    Adhesive capsulitis of left shoulder 12/07/2018   Diverticulitis of colon 03/05/2012   Formatting of this note might be different from the original. IMPRESSION: Colonoscopy reveals multiple areas of diverticulosis. Will treat for suspected diverticulitis. RTO if sxs worsen or do not improve. gwt 03/05/2012   Dyslipidemia 02/19/2016   Encounter for gynecological examination 12/07/2018   Essential hypertension 02/19/2016   High cholesterol    Hypercholesterolemia 06/23/2013   Formatting of this note might be different from the original. IMPRESSION: The goal is to have your total cholesterol < 200, the HDL (good cholesterol) >40, and the LDL (bad cholesterol) <100.  And triglycerides should be under 150.`E1o3L`It is recommended that you follow a good diet low in animal and dairy fat and exercise for at least 45 minutes a day 5-6 days a week. `E1o3L`*LDL still high at 14   Obesity 12/07/2018   Syncope and collapse 01/17/2022   Tobacco use disorder 02/19/2016   Type 2 diabetes mellitus with diabetic neuropathy, unspecified (HCC) 05/01/2020   Ventricular tachycardia (HCC) 02/21/2022    Past Surgical History:  Procedure Laterality Date   CESAREAN SECTION  COLONOSCOPY  2014   normal    Social History   Tobacco Use   Smoking status: Every Day    Current packs/day: 0.00    Types: Cigarettes    Last attempt to quit: 09/05/2021    Years since quitting: 2.1   Smokeless tobacco: Never  Vaping Use   Vaping status: Never Used  Substance Use Topics   Alcohol use: Yes    Comment: 1-2 times per week   Drug use: Never    Family History   Problem Relation Age of Onset   Stroke Maternal Grandmother    Colon cancer Neg Hx    Colon polyps Neg Hx    Esophageal cancer Neg Hx    Rectal cancer Neg Hx    Stomach cancer Neg Hx     Allergies  Allergen Reactions   Lisinopril  Swelling    Angioedema noted 09/2023    Medication list has been reviewed and updated.  Current Outpatient Medications on File Prior to Visit  Medication Sig Dispense Refill   amLODipine  (NORVASC ) 10 MG tablet Take 1 tablet (10 mg total) by mouth daily. 90 tablet 3   aspirin  81 MG tablet Take 81 mg by mouth daily.     atorvastatin  (LIPITOR) 40 MG tablet Take 1 tablet (40 mg total) by mouth daily. 90 tablet 3   Cholecalciferol (VITAMIN D3) 1.25 MG (50000 UT) CAPS Take 1 weekly for 12 weeks 12 capsule 0   cyclobenzaprine  (FLEXERIL ) 5 MG tablet Take 2 tablets (10 mg total) by mouth at bedtime. 30 tablet 0   FLUoxetine  (PROZAC ) 20 MG capsule Take 1 capsule (20 mg total) by mouth daily. 90 capsule 3   metFORMIN  (GLUCOPHAGE ) 500 MG tablet Take 1 tablet (500 mg total) by mouth daily. 90 tablet 3   omeprazole  (PRILOSEC) 40 MG capsule Take 1 capsule (40 mg total) by mouth daily. 90 capsule 3   No current facility-administered medications on file prior to visit.    Review of Systems:  As per HPI- otherwise negative.   Physical Examination: There were no vitals filed for this visit. There were no vitals filed for this visit. There is no height or weight on file to calculate BMI. Ideal Body Weight:    GEN: no acute distress. HEENT: Atraumatic, Normocephalic.  Ears and Nose: No external deformity. CV: RRR, No M/G/R. No JVD. No thrill. No extra heart sounds. PULM: CTA B, no wheezes, crackles, rhonchi. No retractions. No resp. distress. No accessory muscle use. ABD: S, NT, ND, +BS. No rebound. No HSM. EXTR: No c/c/e PSYCH: Normally interactive. Conversant.    Assessment and Plan: ***  Signed Gates Kasal, MD

## 2023-10-24 NOTE — Patient Instructions (Addendum)
 It was good to see you today, I am sorry that you had this allergic reaction It sounds like you may have had angioedema; this is a relatively common side effect/reaction to medications like lisinopril  but can occur even if you have been taking the medication for years. We will continue amlodipine  10 mg and add hydrochlorothiazide  12.5 mg daily for your BP Please schedule a lab visit only in 6-8 weeks to check on your kidney function  Please let me know how your blood pressure responds to adding the hydrochlorothiazide  to your regimen; I am hoping this will be enough to get your blood pressure to goal!

## 2023-10-27 ENCOUNTER — Ambulatory Visit: Admitting: Family Medicine

## 2023-10-27 VITALS — BP 140/72 | HR 90 | Temp 98.0°F | Ht 67.0 in | Wt 153.0 lb

## 2023-10-27 DIAGNOSIS — I10 Essential (primary) hypertension: Secondary | ICD-10-CM

## 2023-10-27 DIAGNOSIS — T783XXD Angioneurotic edema, subsequent encounter: Secondary | ICD-10-CM | POA: Diagnosis not present

## 2023-10-27 MED ORDER — HYDROCHLOROTHIAZIDE 12.5 MG PO TABS: 12.5000 mg | ORAL_TABLET | Freq: Every day | ORAL | 3 refills | Status: AC

## 2023-11-03 ENCOUNTER — Emergency Department (HOSPITAL_BASED_OUTPATIENT_CLINIC_OR_DEPARTMENT_OTHER)
Admission: EM | Admit: 2023-11-03 | Discharge: 2023-11-03 | Disposition: A | Discharge status: Home / Self Care | Attending: Emergency Medicine | Admitting: Emergency Medicine

## 2023-11-03 ENCOUNTER — Emergency Department (HOSPITAL_BASED_OUTPATIENT_CLINIC_OR_DEPARTMENT_OTHER)

## 2023-11-03 ENCOUNTER — Encounter: Payer: Self-pay | Admitting: Family Medicine

## 2023-11-03 ENCOUNTER — Ambulatory Visit: Admitting: Physician Assistant

## 2023-11-03 ENCOUNTER — Encounter (HOSPITAL_BASED_OUTPATIENT_CLINIC_OR_DEPARTMENT_OTHER): Payer: Self-pay

## 2023-11-03 ENCOUNTER — Other Ambulatory Visit: Payer: Self-pay

## 2023-11-03 DIAGNOSIS — Z7982 Long term (current) use of aspirin: Secondary | ICD-10-CM | POA: Diagnosis not present

## 2023-11-03 DIAGNOSIS — R791 Abnormal coagulation profile: Secondary | ICD-10-CM | POA: Diagnosis not present

## 2023-11-03 DIAGNOSIS — R0602 Shortness of breath: Secondary | ICD-10-CM | POA: Diagnosis present

## 2023-11-03 DIAGNOSIS — I2693 Single subsegmental pulmonary embolism without acute cor pulmonale: Secondary | ICD-10-CM | POA: Diagnosis not present

## 2023-11-03 DIAGNOSIS — Z79899 Other long term (current) drug therapy: Secondary | ICD-10-CM | POA: Insufficient documentation

## 2023-11-03 LAB — BASIC METABOLIC PANEL WITH GFR
Anion gap: 20 — ABNORMAL HIGH (ref 5–15)
BUN: 19 mg/dL (ref 8–23)
CO2: 21 mmol/L — ABNORMAL LOW (ref 22–32)
Calcium: 10.9 mg/dL — ABNORMAL HIGH (ref 8.9–10.3)
Chloride: 97 mmol/L — ABNORMAL LOW (ref 98–111)
Creatinine, Ser: 1.22 mg/dL — ABNORMAL HIGH (ref 0.44–1.00)
GFR, Estimated: 50 mL/min — ABNORMAL LOW (ref >60)
Glucose, Bld: 123 mg/dL — ABNORMAL HIGH (ref 70–99)
Potassium: 4 mmol/L (ref 3.5–5.1)
Sodium: 138 mmol/L (ref 135–145)

## 2023-11-03 LAB — CBC
HCT: 45.4 % (ref 36.0–46.0)
Hemoglobin: 15.3 g/dL — ABNORMAL HIGH (ref 12.0–15.0)
MCH: 31 pg (ref 26.0–34.0)
MCHC: 33.7 g/dL (ref 30.0–36.0)
MCV: 91.9 fL (ref 80.0–100.0)
Platelets: 302 10*3/uL (ref 150–400)
RBC: 4.94 MIL/uL (ref 3.87–5.11)
RDW: 13.3 % (ref 11.5–15.5)
WBC: 7.8 10*3/uL (ref 4.0–10.5)
nRBC: 0 % (ref 0.0–0.2)

## 2023-11-03 LAB — CBG MONITORING, ED: Glucose-Capillary: 120 mg/dL — ABNORMAL HIGH (ref 70–99)

## 2023-11-03 LAB — TROPONIN T, HIGH SENSITIVITY
Troponin T High Sensitivity: <15 ng/L (ref <19)
Troponin T High Sensitivity: <15 ng/L (ref <19)

## 2023-11-03 LAB — D-DIMER, QUANTITATIVE: D-Dimer, Quant: 0.89 ug{FEU}/mL — ABNORMAL HIGH (ref 0.00–0.50)

## 2023-11-03 MED ORDER — SODIUM CHLORIDE 0.9 % IV BOLUS
1000.0000 mL | Freq: Once | INTRAVENOUS | Status: AC
  Administered 2023-11-03: 1000 mL via INTRAVENOUS

## 2023-11-03 MED ORDER — APIXABAN (ELIQUIS) VTE STARTER PACK (10MG AND 5MG): ORAL_TABLET | ORAL | 0 refills | Status: DC

## 2023-11-03 MED ORDER — IOHEXOL 350 MG/ML SOLN
100.0000 mL | Freq: Once | INTRAVENOUS | Status: AC | PRN
  Administered 2023-11-03: 80 mL via INTRAVENOUS

## 2023-11-03 NOTE — ED Notes (Signed)
 Patient ambulated with pulse ox. No distress noted. SAT 97% and no increased HR. MD aware

## 2023-11-03 NOTE — ED Provider Notes (Signed)
 [place] Provider Note   CSN: [id number] Arrival date & time: 11/03/23  1324     History  Chief Complaint  Patient presents with   Shortness of Breath   Palpitations    Margaret Sims is a 63 y.o. female.   Shortness of Breath Palpitations Associated symptoms: shortness of breath   Patient presents with shortness of breath palpitations dizziness for around a week.  Feeling bad.  Had been on a cruise recently and had to change her blood pressure medicines due to angioedema.  Drove to [place]  for about 9 hours but did have frequent stops.  States she has had cramping in her hands and legs.     Home Medications Prior to Admission medications   Medication Sig Start Date End Date Taking? Authorizing Provider  APIXABAN Herby Lolling) VTE STARTER PACK (10MG  AND 5MG ) Take as directed on package: start with two-5mg  tablets twice daily for 7 days. On day 8, switch to one-5mg  tablet twice daily. 11/03/23  Yes Mozell Arias, MD  amLODipine  (NORVASC ) 10 MG tablet Take 1 tablet (10 mg total) by mouth daily. 10/06/23   Copland, Jessica C, MD  aspirin  81 MG tablet Take 81 mg by mouth daily.    [provider]  atorvastatin  (LIPITOR) 40 MG tablet Take 1 tablet (40 mg total) by mouth daily. 03/03/23   Copland, Jessica C, MD  Cholecalciferol (VITAMIN D3) 1.25 MG (50000 UT) CAPS Take 1 weekly for 12 weeks 09/04/23   Copland, Skipper Dumas, MD  cyclobenzaprine  (FLEXERIL ) 5 MG tablet Take 2 tablets (10 mg total) by mouth at bedtime. 10/02/23   Bronson Canny, PA-C  FLUoxetine  (PROZAC ) 20 MG capsule Take 1 capsule (20 mg total) by mouth daily. 09/24/23   Copland, Skipper Dumas, MD  hydrochlorothiazide  (HYDRODIURIL ) 12.5 MG tablet Take 1 tablet (12.5 mg total) by mouth daily. 10/27/23   Copland, Skipper Dumas, MD  metFORMIN  (GLUCOPHAGE ) 500 MG tablet Take 1 tablet (500 mg total) by mouth daily. 03/03/23   Copland, Skipper Dumas, MD  omeprazole  (PRILOSEC) 40 MG  capsule Take 1 capsule (40 mg total) by mouth daily. 03/03/23   Copland, Skipper Dumas, MD      Allergies    Lisinopril     Review of Systems   Review of Systems  Respiratory:  Positive for shortness of breath.   Cardiovascular:  Positive for palpitations.    Physical Exam Updated Vital Signs BP 132/89   Pulse 81   Temp 98.1 F (36.7 C) (Oral)   Resp 11   Ht 5\' 7"  (1.702 m)   Wt 69.4 kg   SpO2 98%   BMI 23.96 kg/m  Physical Exam Vitals and nursing note reviewed.  Cardiovascular:     Rate and Rhythm: Regular rhythm. Tachycardia present.  Chest:     Chest wall: No tenderness.  Abdominal:     Tenderness: There is no abdominal tenderness.  Musculoskeletal:     Right lower leg: No edema.     Left lower leg: No edema.  Skin:    Capillary Refill: Capillary refill takes less than 2 seconds.  Neurological:     Mental Status: She is alert and oriented to person, place, and time.     ED Results / Procedures / Treatments   Labs (all labs ordered are listed, but only abnormal results are displayed) Labs Reviewed  CBC - Abnormal; Notable for the following components:      Result Value   Hemoglobin 15.3 (*)  All other components within normal limits  BASIC METABOLIC PANEL WITH GFR - Abnormal; Notable for the following components:   Chloride 97 (*)    CO2 21 (*)    Glucose, Bld 123 (*)    Creatinine, Ser 1.22 (*)    Calcium  10.9 (*)    GFR, Estimated 50 (*)    Anion gap 20 (*)    All other components within normal limits  D-DIMER, QUANTITATIVE - Abnormal; Notable for the following components:   D-Dimer, Quant 0.89 (*)    All other components within normal limits  CBG MONITORING, ED - Abnormal; Notable for the following components:   Glucose-Capillary 120 (*)    All other components within normal limits  TROPONIN T, HIGH SENSITIVITY  TROPONIN T, HIGH SENSITIVITY    EKG EKG Interpretation Date/Time:  Monday November 03 2023 09:10:52 EDT Ventricular Rate:  119 PR  Interval:  133 QRS Duration:  76 QT Interval:  323 QTC Calculation: 453 R Axis:   -23  Text Interpretation: Sinus tachycardia Premature atrial complexes Probable left atrial enlargement Left ventricular hypertrophy Confirmed by Mozell Arias [phone]) on 11/03/2023 9:38:46 AM  Radiology CT Angio Chest PE W and/or Wo Contrast Result Date: 11/03/2023 CLINICAL DATA:  Positive D-dimer. Shortness of breath and palpitations. Dizziness. Clinical concern for pulmonary embolus. EXAM: CT ANGIOGRAPHY CHEST WITH CONTRAST TECHNIQUE: Multidetector CT imaging of the chest was performed using the standard protocol during bolus administration of intravenous contrast. Multiplanar CT image reconstructions and MIPs were obtained to evaluate the vascular anatomy. RADIATION DOSE REDUCTION: This exam was performed according to the departmental dose-optimization program which includes automated exposure control, adjustment of the mA and/or kV according to patient size and/or use of iterative reconstruction technique. CONTRAST:  80mL OMNIPAQUE  IOHEXOL  350 MG/ML SOLN COMPARISON:  02/07/2023 FINDINGS: Cardiovascular: The heart size is upper normal to borderline enlarged. Coronary artery calcification is evident. Mild atherosclerotic calcification is noted in the wall of the thoracic aorta. Isolated pulmonary embolus identified in a subsegmental pulmonary artery to the posterior right upper lobe (see axial 151/11 and sagittal 42/9). Mediastinum/Nodes: No mediastinal lymphadenopathy. There is no hilar lymphadenopathy. The esophagus has normal imaging features. There is no axillary lymphadenopathy. Lungs/Pleura: Centrilobular emphsyema noted. 3 mm subpleural nodule in the left upper lobe on 33/13 is stable in the interval. Dependent atelectasis noted bilaterally. No pleural effusion. Upper Abdomen: Visualized portion of the upper abdomen shows no acute findings. Musculoskeletal: No worrisome lytic or sclerotic osseous abnormality.  Review of the MIP images confirms the above findings. IMPRESSION: 1. Isolated pulmonary embolus in a subsegmental pulmonary artery to the posterior right upper lobe. 2. Stable 3 mm subpleural nodule in the left upper lobe. No followup imaging is recommended. Critical Value/emergent results were called by telephone at the time of interpretation on 11/03/2023 at 11:16 am to provider [facility] , who verbally acknowledged these results. Electronically Signed   By: Donnal Fusi M.D.   On: 11/03/2023 11:17   DG Chest Portable 1 View Result Date: 11/03/2023 CLINICAL DATA:  Shortness of breath. EXAM: PORTABLE CHEST 1 VIEW COMPARISON:  02/07/2023 FINDINGS: The lungs are clear without focal pneumonia, edema, pneumothorax or pleural effusion. The cardiopericardial silhouette is within normal limits for size. No acute bony abnormality. Telemetry leads overlie the chest. IMPRESSION: No active disease. Electronically Signed   By: Donnal Fusi M.D.   On: 11/03/2023 10:50    Procedures Procedures    Medications Ordered in ED Medications  iohexol  (OMNIPAQUE ) 350 MG/ML injection 100  mL (80 mLs Intravenous Contrast Given 11/03/23 1051)  sodium chloride  0.9 % bolus 1,000 mL (0 mLs Intravenous Stopped 11/03/23 1227)    ED Course/ Medical Decision Making/ A&P                                 Medical Decision Making Amount and/or Complexity of Data Reviewed Labs: ordered. Radiology: ordered.  Risk Prescription drug management.   Patient with shortness of breath.  Has felt bad for the last few days.  Initially tachycardia but is improved.  But then recurred.  Did have recent travel.  May be component of anxiety.  Will get x-ray.  EKG shows sinus rhythm.  Independently interpreted by me.  Reviewed previous notes.  Has had previous short episode of V. tach.  Will also get D-dimer.  D-dimer mildly elevated.  CT scan done and does show a subsegmental PE.  Not hypoxic.  Able to ambulate without difficulty.  I  think patient is stable for outpatient management of her PE.  Will start on Eliquis.  Also discussed with Dr. Allison Arena, her PCP later.       Final Clinical Impression(s) / ED Diagnoses Final diagnoses:  Single subsegmental pulmonary embolism without acute cor pulmonale (HCC)    Rx / DC Orders ED Discharge Orders          Ordered    APIXABAN (ELIQUIS) VTE STARTER PACK (10MG  AND 5MG )       Note to Pharmacy: If starter pack unavailable, substitute with seventy-four 5 mg apixaban tabs following the above SIG directions.   11/03/23 1153              Mozell Arias, MD 11/03/23 1435

## 2023-11-03 NOTE — ED Notes (Signed)
 X-ray at bedside

## 2023-11-03 NOTE — ED Triage Notes (Signed)
 Pt presents to ED from home C/O SOB, palpitations, dizziness X 1 week.

## 2023-11-04 ENCOUNTER — Emergency Department (HOSPITAL_BASED_OUTPATIENT_CLINIC_OR_DEPARTMENT_OTHER)
Admission: EM | Admit: 2023-11-04 | Discharge: 2023-11-04 | Disposition: A | Discharge status: Home / Self Care | Attending: Emergency Medicine | Admitting: Emergency Medicine

## 2023-11-04 ENCOUNTER — Ambulatory Visit: Payer: Self-pay

## 2023-11-04 ENCOUNTER — Emergency Department (HOSPITAL_BASED_OUTPATIENT_CLINIC_OR_DEPARTMENT_OTHER)

## 2023-11-04 ENCOUNTER — Encounter (HOSPITAL_BASED_OUTPATIENT_CLINIC_OR_DEPARTMENT_OTHER): Payer: Self-pay

## 2023-11-04 ENCOUNTER — Other Ambulatory Visit: Payer: Self-pay

## 2023-11-04 DIAGNOSIS — E119 Type 2 diabetes mellitus without complications: Secondary | ICD-10-CM | POA: Insufficient documentation

## 2023-11-04 DIAGNOSIS — Z79899 Other long term (current) drug therapy: Secondary | ICD-10-CM | POA: Insufficient documentation

## 2023-11-04 DIAGNOSIS — Z7901 Long term (current) use of anticoagulants: Secondary | ICD-10-CM | POA: Diagnosis not present

## 2023-11-04 DIAGNOSIS — R110 Nausea: Secondary | ICD-10-CM | POA: Insufficient documentation

## 2023-11-04 DIAGNOSIS — M79661 Pain in right lower leg: Secondary | ICD-10-CM | POA: Insufficient documentation

## 2023-11-04 DIAGNOSIS — Z7984 Long term (current) use of oral hypoglycemic drugs: Secondary | ICD-10-CM | POA: Insufficient documentation

## 2023-11-04 DIAGNOSIS — R42 Dizziness and giddiness: Secondary | ICD-10-CM | POA: Insufficient documentation

## 2023-11-04 DIAGNOSIS — Z7982 Long term (current) use of aspirin: Secondary | ICD-10-CM | POA: Insufficient documentation

## 2023-11-04 DIAGNOSIS — I10 Essential (primary) hypertension: Secondary | ICD-10-CM | POA: Insufficient documentation

## 2023-11-04 DIAGNOSIS — M79662 Pain in left lower leg: Secondary | ICD-10-CM | POA: Diagnosis not present

## 2023-11-04 DIAGNOSIS — I2693 Single subsegmental pulmonary embolism without acute cor pulmonale: Secondary | ICD-10-CM

## 2023-11-04 DIAGNOSIS — F17200 Nicotine dependence, unspecified, uncomplicated: Secondary | ICD-10-CM | POA: Diagnosis not present

## 2023-11-04 DIAGNOSIS — R0602 Shortness of breath: Secondary | ICD-10-CM | POA: Insufficient documentation

## 2023-11-04 LAB — CBC
HCT: 44.3 % (ref 36.0–46.0)
Hemoglobin: 14.7 g/dL (ref 12.0–15.0)
MCH: 30.9 pg (ref 26.0–34.0)
MCHC: 33.2 g/dL (ref 30.0–36.0)
MCV: 93.3 fL (ref 80.0–100.0)
Platelets: 302 10*3/uL (ref 150–400)
RBC: 4.75 MIL/uL (ref 3.87–5.11)
RDW: 13.2 % (ref 11.5–15.5)
WBC: 8.1 10*3/uL (ref 4.0–10.5)
nRBC: 0 % (ref 0.0–0.2)

## 2023-11-04 LAB — PRO BRAIN NATRIURETIC PEPTIDE: Pro Brain Natriuretic Peptide: <36 pg/mL (ref <300.0)

## 2023-11-04 LAB — BASIC METABOLIC PANEL WITH GFR
Anion gap: 17 — ABNORMAL HIGH (ref 5–15)
BUN: 16 mg/dL (ref 8–23)
CO2: 22 mmol/L (ref 22–32)
Calcium: 10.6 mg/dL — ABNORMAL HIGH (ref 8.9–10.3)
Chloride: 100 mmol/L (ref 98–111)
Creatinine, Ser: 1.13 mg/dL — ABNORMAL HIGH (ref 0.44–1.00)
GFR, Estimated: 54 mL/min — ABNORMAL LOW (ref >60)
Glucose, Bld: 106 mg/dL — ABNORMAL HIGH (ref 70–99)
Potassium: 3.4 mmol/L — ABNORMAL LOW (ref 3.5–5.1)
Sodium: 139 mmol/L (ref 135–145)

## 2023-11-04 LAB — TROPONIN T, HIGH SENSITIVITY: Troponin T High Sensitivity: <15 ng/L (ref <19)

## 2023-11-04 MED ORDER — ONDANSETRON HCL 4 MG/2ML IJ SOLN
4.0000 mg | Freq: Once | INTRAMUSCULAR | Status: AC
  Administered 2023-11-04: 4 mg via INTRAVENOUS
  Filled 2023-11-04: qty 2

## 2023-11-04 MED ORDER — ONDANSETRON 4 MG PO TBDP: 4.0000 mg | ORAL_TABLET | Freq: Four times a day (QID) | ORAL | 0 refills | Status: AC | PRN

## 2023-11-04 MED ORDER — ALBUTEROL SULFATE HFA 108 (90 BASE) MCG/ACT IN AERS: 2.0000 | INHALATION_SPRAY | RESPIRATORY_TRACT | Status: DC | PRN

## 2023-11-04 NOTE — Discharge Instructions (Addendum)
 You can use the nausea medication that prescribed up to every 6 hours as needed for nausea.  Please follow-up as discussed with your primary care doctor on Thursday as planned.  Continue taking your Eliquis.  As we discussed you are safe to take the Eliquis and the Prozac  at the same time, there is a slight increased risk of bleeding, so monitor for any severe nosebleed, gum bleeding, bruising.  If you have severe worsening and persistent chest pain, shortness of breath, or new episode of passing out you may want to seek further evaluation in the ED.

## 2023-11-04 NOTE — ED Notes (Signed)
..  The patient is A&OX4, ambulatory at d/c with independent steady gait, NAD. Pt verbalized understanding of d/c instructions, prescriptions and follow up care.

## 2023-11-04 NOTE — Telephone Encounter (Signed)
FYI. Pt going to ED.  

## 2023-11-04 NOTE — Progress Notes (Signed)
 [facility] [address], [address] [place], [place] [address] [phone]  Date:  11/06/2023   Name:  Margaret Sims   DOB:  07/06/1961   MRN:  [id number]  PCP:  Kaylee Partridge, MD    Chief Complaint: Follow-up (Pt would like to know what her next steps are /)   History of Present Illness:  Margaret Sims is a 63 y.o. very pleasant female patient who presents with the following:  Pt seen today for follow-up of recently dx PE History of well-controlled diabetes, hypertension, dyslipidemia, elevated coronary calcium   Seen by myself most recently 4/21- at that time she had recently stopped her lisinopril  due to a possible allergic reaction   She then ended up in the ER on both 4/28- when she was dx with a PE and again the next day with SOB She was started on Eliquis  for the PE - given a starter pack by the ER   She feels tired, low appetite  She had some nausea but is not vomiting She notes she still gets winded with walking up steps- not improved yet   Never had a PE in the past She is not aware of any family history of same No family history of any bleeding disorder, she never had a MC She returned from her cruise on 3/14- drove home from [place]    Patient Active Problem List   Diagnosis Date Noted   COVID-19 01/27/2023   Abdominal bruit 11/12/2022   Coronary artery disease 11/12/2022   Acid reflux 11/01/2022   High cholesterol 11/01/2022   Ventricular tachycardia (HCC) 02/21/2022   Syncope and collapse 01/17/2022   Type 2 diabetes mellitus with diabetic neuropathy, unspecified (HCC) 05/01/2020   Adhesive capsulitis of left shoulder 12/07/2018   Encounter for gynecological examination 12/07/2018   Obesity 12/07/2018   Essential hypertension 02/19/2016   Dyslipidemia 02/19/2016   Tobacco use disorder 02/19/2016   Hypercholesterolemia 06/23/2013   Diverticulitis of colon 03/05/2012    Past Medical History:   Diagnosis Date   Acid reflux    Adhesive capsulitis of left shoulder 12/07/2018   Diverticulitis of colon 03/05/2012   Formatting of this note might be different from the original. IMPRESSION: Colonoscopy reveals multiple areas of diverticulosis. Will treat for suspected diverticulitis. RTO if sxs worsen or do not improve. gwt 03/05/2012   Dyslipidemia 02/19/2016   Encounter for gynecological examination 12/07/2018   Essential hypertension 02/19/2016   High cholesterol    Hypercholesterolemia 06/23/2013   Formatting of this note might be different from the original. IMPRESSION: The goal is to have your total cholesterol < 200, the HDL (good cholesterol) >40, and the LDL (bad cholesterol) <100.  And triglycerides should be under 150.`E1o3L`It is recommended that you follow a good diet low in animal and dairy fat and exercise for at least 45 minutes a day 5-6 days a week. `E1o3L`*LDL still high at 14   Obesity 12/07/2018   Syncope and collapse 01/17/2022   Tobacco use disorder 02/19/2016   Type 2 diabetes mellitus with diabetic neuropathy, unspecified (HCC) 05/01/2020   Ventricular tachycardia (HCC) 02/21/2022    Past Surgical History:  Procedure Laterality Date   CESAREAN SECTION     COLONOSCOPY  2014   normal    Social History   Tobacco Use   Smoking status: Every Day    Current packs/day: 0.00    Types: Cigarettes    Last attempt to quit:  09/05/2021    Years since quitting: 2.1   Smokeless tobacco: Never  Vaping Use   Vaping status: Never Used  Substance Use Topics   Alcohol use: Yes    Comment: 1-2 times per week   Drug use: Never    Family History  Problem Relation Age of Onset   Stroke Maternal Grandmother    Colon cancer Neg Hx    Colon polyps Neg Hx    Esophageal cancer Neg Hx    Rectal cancer Neg Hx    Stomach cancer Neg Hx     Allergies  Allergen Reactions   Lisinopril  Swelling    Angioedema noted 09/2023    Medication list has been reviewed and  updated.  Current Outpatient Medications on File Prior to Visit  Medication Sig Dispense Refill   amLODipine  (NORVASC ) 10 MG tablet Take 1 tablet (10 mg total) by mouth daily. 90 tablet 3   APIXABAN  (ELIQUIS ) VTE STARTER PACK (10MG  AND 5MG ) Take as directed on package: start with two-5mg  tablets twice daily for 7 days. On day 8, switch to one-5mg  tablet twice daily. 74 each 0   aspirin  81 MG tablet Take 81 mg by mouth daily.     atorvastatin  (LIPITOR) 40 MG tablet Take 1 tablet (40 mg total) by mouth daily. 90 tablet 3   Cholecalciferol (VITAMIN D3) 1.25 MG (50000 UT) CAPS Take 1 weekly for 12 weeks 12 capsule 0   hydrochlorothiazide  (HYDRODIURIL ) 12.5 MG tablet Take 1 tablet (12.5 mg total) by mouth daily. 90 tablet 3   metFORMIN  (GLUCOPHAGE ) 500 MG tablet Take 1 tablet (500 mg total) by mouth daily. 90 tablet 3   omeprazole  (PRILOSEC) 40 MG capsule Take 1 capsule (40 mg total) by mouth daily. 90 capsule 3   ondansetron  (ZOFRAN -ODT) 4 MG disintegrating tablet Take 1 tablet (4 mg total) by mouth every 6 (six) hours as needed for nausea or vomiting. 20 tablet 0   cyclobenzaprine  (FLEXERIL ) 5 MG tablet Take 2 tablets (10 mg total) by mouth at bedtime. (Patient not taking: Reported on 11/06/2023) 30 tablet 0   FLUoxetine  (PROZAC ) 20 MG capsule Take 1 capsule (20 mg total) by mouth daily. (Patient not taking: Reported on 11/06/2023) 90 capsule 3   No current facility-administered medications on file prior to visit.    Review of Systems:  As per HPI- otherwise negative.   Physical Examination: Vitals:   11/06/23 1410  BP: 116/68  Pulse: 68  SpO2: 98%   Vitals:   11/06/23 1410  Weight: 148 lb 6.4 oz (67.3 kg)  Height: 5\' 7"  (1.702 m)   Body mass index is 23.24 kg/m. Ideal Body Weight: Weight in (lb) to have BMI = 25: 159.3  GEN: no acute distress.  Normal weight, looks well  HEENT: Atraumatic, Normocephalic.  Ears and Nose: No external deformity. CV: RRR, No M/G/R. No JVD. No thrill.  No extra heart sounds. PULM: CTA B, no wheezes, crackles, rhonchi. No retractions. No resp. distress. No accessory muscle use. ABD: S, NT, ND EXTR: No c/c/e PSYCH: Normally interactive. Conversant.    Assessment and Plan: Acute pulmonary embolism without acute cor pulmonale, unspecified pulmonary embolism type (HCC) - Plan: Ambulatory referral to Hematology / Oncology  Patient seen today for follow-up of recently diagnosed segmental pulmonary embolism.  She is taking her blood thinner which makes her feel a bit nauseated No distress, but she still feels fatigued As above, no family history of clotting disorder, she has never had a blood clot previously.  She did recently travel to [place]  which may be the cause of her blood clot For the time being we will have her continue with Eliquis  and have her see hematology.  Explained to patient they will help us  determine if she needs to stay on blood thinners long-term or just temporarily.  She will contact me if any concerns or worsening in the meantime  Signed Gates Kasal, MD

## 2023-11-04 NOTE — ED Triage Notes (Signed)
 Patient arrives POV with complaints of worsening shortness of breath, dizziness, and bilateral calf pain. Patient was seen here yesterday and diagnosed with a blood clot in her lung. Rates pain her legs a 5/10.

## 2023-11-04 NOTE — Telephone Encounter (Signed)
 Pt has called again asking about the following mychart message.

## 2023-11-04 NOTE — ED Provider Notes (Signed)
 [place] [facility] AT [facility] Provider Note   CSN: [id number] Arrival date & time: 11/04/23  1252     History  Chief Complaint  Patient presents with   Shortness of Breath   Dizziness    Margaret Sims is a 63 y.o. female with past medical history significant for hypertension, tobacco use, diabetes, 1 previous episode of V. tach with no recurrent episodes, hyperlipidemia who was diagnosed yesterday with DVTs, and a subsegmental pulmonary embolus who presents with concern for some shortness of breath, dizziness, nausea and pain in her calfs.  She rates the pain 5/10.  Patient raised concern because she was told to return if any of the symptoms were present, she additionally does endorse that she has been feeling somewhat anxious and wants to make sure that it is safe to take her Prozac  along with the new Eliquis that she was prescribed for the blood clot.  Denies significant chest pain at this time.  Nuys any vomiting.  She has not missed any doses of her Eliquis since yesterday..   Shortness of Breath Dizziness Associated symptoms: shortness of breath        Home Medications Prior to Admission medications   Medication Sig Start Date End Date Taking? Authorizing Provider  amLODipine  (NORVASC ) 10 MG tablet Take 1 tablet (10 mg total) by mouth daily. 10/06/23   Copland, Jessica C, MD  APIXABAN Herby Lolling) VTE STARTER PACK (10MG  AND 5MG ) Take as directed on package: start with two-5mg  tablets twice daily for 7 days. On day 8, switch to one-5mg  tablet twice daily. 11/03/23   Mozell Arias, MD  aspirin  81 MG tablet Take 81 mg by mouth daily.    [provider]  atorvastatin  (LIPITOR) 40 MG tablet Take 1 tablet (40 mg total) by mouth daily. 03/03/23   Copland, Jessica C, MD  Cholecalciferol (VITAMIN D3) 1.25 MG (50000 UT) CAPS Take 1 weekly for 12 weeks 09/04/23   Copland, Skipper Dumas, MD  cyclobenzaprine  (FLEXERIL ) 5 MG tablet Take 2 tablets (10 mg total)  by mouth at bedtime. 10/02/23   Bronson Canny, PA-C  FLUoxetine  (PROZAC ) 20 MG capsule Take 1 capsule (20 mg total) by mouth daily. 09/24/23   Copland, Skipper Dumas, MD  hydrochlorothiazide  (HYDRODIURIL ) 12.5 MG tablet Take 1 tablet (12.5 mg total) by mouth daily. 10/27/23   Copland, Skipper Dumas, MD  metFORMIN  (GLUCOPHAGE ) 500 MG tablet Take 1 tablet (500 mg total) by mouth daily. 03/03/23   Copland, Skipper Dumas, MD  omeprazole  (PRILOSEC) 40 MG capsule Take 1 capsule (40 mg total) by mouth daily. 03/03/23   Copland, Skipper Dumas, MD      Allergies    Lisinopril     Review of Systems   Review of Systems  Respiratory:  Positive for shortness of breath.   Neurological:  Positive for dizziness.  All other systems reviewed and are negative.   Physical Exam Updated Vital Signs BP (!) 123/97   Pulse 61   Temp 97.7 F (36.5 C)   Resp 18   SpO2 99%  Physical Exam Vitals and nursing note reviewed.  Constitutional:      General: She is not in acute distress.    Appearance: Normal appearance.  HENT:     Head: Normocephalic and atraumatic.  Eyes:     General:        Right eye: No discharge.        Left eye: No discharge.  Cardiovascular:     Rate and  Rhythm: Normal rate and regular rhythm.     Heart sounds: No murmur heard.    No friction rub. No gallop.  Pulmonary:     Effort: Pulmonary effort is normal.     Breath sounds: Normal breath sounds.  Abdominal:     General: Bowel sounds are normal.     Palpations: Abdomen is soft.  Skin:    General: Skin is warm and dry.     Capillary Refill: Capillary refill takes less than 2 seconds.  Neurological:     Mental Status: She is alert and oriented to person, place, and time.  Psychiatric:        Mood and Affect: Mood normal.        Behavior: Behavior normal.     ED Results / Procedures / Treatments   Labs (all labs ordered are listed, but only abnormal results are displayed) Labs Reviewed  CBC  BASIC METABOLIC PANEL WITH GFR  PRO BRAIN  NATRIURETIC PEPTIDE  TROPONIN T, HIGH SENSITIVITY    EKG EKG Interpretation Date/Time:  Tuesday November 04 2023 13:12:34 EDT Ventricular Rate:  112 PR Interval:  134 QRS Duration:  78 QT Interval:  320 QTC Calculation: 436 R Axis:   -32  Text Interpretation: Sinus tachycardia Left axis deviation Moderate voltage criteria for LVH, may be normal variant ( R in aVL , Cornell product ) Abnormal ECG When compared with ECG of 03-Nov-2023 09:10, PREVIOUS ECG IS PRESENT Confirmed by Jerald Molly [phone]) on 11/04/2023 2:08:56 PM  Radiology CT Angio Chest PE W and/or Wo Contrast Result Date: 11/03/2023 CLINICAL DATA:  Positive D-dimer. Shortness of breath and palpitations. Dizziness. Clinical concern for pulmonary embolus. EXAM: CT ANGIOGRAPHY CHEST WITH CONTRAST TECHNIQUE: Multidetector CT imaging of the chest was performed using the standard protocol during bolus administration of intravenous contrast. Multiplanar CT image reconstructions and MIPs were obtained to evaluate the vascular anatomy. RADIATION DOSE REDUCTION: This exam was performed according to the departmental dose-optimization program which includes automated exposure control, adjustment of the mA and/or kV according to patient size and/or use of iterative reconstruction technique. CONTRAST:  80mL OMNIPAQUE  IOHEXOL  350 MG/ML SOLN COMPARISON:  02/07/2023 FINDINGS: Cardiovascular: The heart size is upper normal to borderline enlarged. Coronary artery calcification is evident. Mild atherosclerotic calcification is noted in the wall of the thoracic aorta. Isolated pulmonary embolus identified in a subsegmental pulmonary artery to the posterior right upper lobe (see axial 151/11 and sagittal 42/9). Mediastinum/Nodes: No mediastinal lymphadenopathy. There is no hilar lymphadenopathy. The esophagus has normal imaging features. There is no axillary lymphadenopathy. Lungs/Pleura: Centrilobular emphsyema noted. 3 mm subpleural nodule in the left upper  lobe on 33/13 is stable in the interval. Dependent atelectasis noted bilaterally. No pleural effusion. Upper Abdomen: Visualized portion of the upper abdomen shows no acute findings. Musculoskeletal: No worrisome lytic or sclerotic osseous abnormality. Review of the MIP images confirms the above findings. IMPRESSION: 1. Isolated pulmonary embolus in a subsegmental pulmonary artery to the posterior right upper lobe. 2. Stable 3 mm subpleural nodule in the left upper lobe. No followup imaging is recommended. Critical Value/emergent results were called by telephone at the time of interpretation on 11/03/2023 at 11:16 am to provider [facility] , who verbally acknowledged these results. Electronically Signed   By: Donnal Fusi M.D.   On: 11/03/2023 11:17   DG Chest Portable 1 View Result Date: 11/03/2023 CLINICAL DATA:  Shortness of breath. EXAM: PORTABLE CHEST 1 VIEW COMPARISON:  02/07/2023 FINDINGS: The lungs are clear without focal  pneumonia, edema, pneumothorax or pleural effusion. The cardiopericardial silhouette is within normal limits for size. No acute bony abnormality. Telemetry leads overlie the chest. IMPRESSION: No active disease. Electronically Signed   By: Donnal Fusi M.D.   On: 11/03/2023 10:50    Procedures Procedures    Medications Ordered in ED Medications  albuterol  (VENTOLIN  HFA) 108 (90 Base) MCG/ACT inhaler 2 puff (has no administration in time range)    ED Course/ Medical Decision Making/ A&P                                 Medical Decision Making Amount and/or Complexity of Data Reviewed Radiology: ordered.  Risk Prescription drug management.   This patient is a 63 y.o. female  who presents to the ED for concern of shortness of breath, dizziness, shortness of breath, leg pain, in context of known subsegmental PE diagnosed yesterday.   Differential diagnoses prior to evaluation: The emergent differential diagnosis includes, but is not limited to, worsening  pulmonary embolus with right heart strain, lower clinical suspicion but considered new ACS, heart failure exacerbation, acute aortic syndrome, versus other, symptoms could also be isolated to just her anxiety although given her recent diagnosis of PE I do think that there is at least some somatic component of her symptoms. This is not an exhaustive differential.   Past Medical History / Co-morbidities / Social History:  hypertension, tobacco use, diabetes, 1 previous episode of V. tach with no recurrent episodes, hyperlipidemia   Additional history: Chart reviewed. Pertinent results include: Extensively reviewed lab work, imaging performed yesterday, patient with new diagnosis of subsegmental pulmonary embolus, she had negative troponins at that time, no evidence of right heart strain and was ultimately discharged in stable condition  Physical Exam: Physical exam performed. The pertinent findings include: Normal heart and lung sounds, no wheezing, rhonchi, stridor, rales, she has mild tachycardia in the room, with heart rate ranging from around 95-102, stable at around 95 at rest.  She is somewhat hypertensive with blood pressure 123/97 on arrival, 144/100 on recheck.  Lab Tests/Imaging studies: I personally interpreted labs/imaging and the pertinent results include: CBC remarkable, BMP does show anion gap of 17 but improving from yesterday, may be secondary to some CO2 retention, otherwise unclear etiology, BNP unremarkable, troponin negative x 1 in context of 2 negative troponins yesterday, mostly stratifying for increased right heart strain today.  I independently interpreted plain film chest x-ray which shows no evidence of acute intrathoracic abnormality I agree with the radiologist interpretation.  Cardiac monitoring: EKG obtained and interpreted by myself and attending physician which shows: Sinus tachycardia, no significant new ST-T changes compared to yesterday   Medications: I ordered  medication including Zofran  for nausea.  I have reviewed the patients home medicines and have made adjustments as needed.   Disposition: After consideration of the diagnostic results and the patients response to treatment, I feel that patient is stable for discharge with close PCP follow-up, I think that her symptoms are explained by combination of anxiety and her recently diagnosed subsegmental PE, prescribed some nausea medication to help with her nausea, recommend follow-up on symptoms and of anion gap with PCP, stable for discharge at this time with stable vital signs.  emergency department workup does not suggest an emergent condition requiring admission or immediate intervention beyond what has been performed at this time. The plan is: as above. The patient is safe for discharge and  has been instructed to return immediately for worsening symptoms, change in symptoms or any other concerns.  Final Clinical Impression(s) / ED Diagnoses Final diagnoses:  None    Rx / DC Orders ED Discharge Orders     None         Nelly Banco, PA-C 11/04/23 1500    Hershel Los, MD 11/05/23 1500

## 2023-11-04 NOTE — Telephone Encounter (Signed)
 Copied from CRM [id number]. Topic: Clinical - Red Word Triage >> Nov 04, 2023 12:24 PM Chuck Crater wrote: Red Word that prompted transfer to Nurse Triage: Patient daughter stated that patient just had a medical emergency where she was dizzy, confused and light headed. Patient was just diagnosed on yesterday with a blood clot.  *Ambulance came and patient was not taken to the hospital.   Chief Complaint: Daughter wanting information about patient's episode of dizziness, confusion, and light headedness Symptoms: shortness of breath,  Frequency: just prior to triage Pertinent Negatives: Patient denies --- Disposition: [x] ED /[] Urgent Care (no appt availability in office) / [] Appointment(In office/virtual)/ []  [place] [facility]/ [] Home Care/ [] Refused Recommended Disposition /[] [place] [facility]/ []  Follow-up with PCP Additional Notes: Patient's daughter called and stated that the patient had called her having an episode where she was dizzy, confused, and light headed.  911 was called and the ambulance came to assess the patient prior to this Triage call.  The ambulance did not transport the patient to the hospital.  The daughter states that the ambulance said the patient had calmed down enough and was okay enough that the family could take her to the Emergency Room by car. Patient's daughter states that she was with her at this time and was wondering if she should take her to the Emergency Room or set up an appointment at the office. She states that her mother states that she did not have this episode due to anxiety.  She had felt short of breath, dizzy, light headed, and confused.  Daughter states that the patient was diagnosed with a blood clot in her lung yesterday. Given these factors and that the episode of difficulty breathing was significant enough that the patient wanted an ambulance in the first place, this RN advised the daughter to take her mother to the Emergency Room at this time for  further evaluation, especially due to having been diagnosed with a blood clot in her lung yesterday.  She is also advised that if anything worsens she can call an ambulance back again at any time.  Daughter verbalized understanding and states that she is going to take her to the Emergency Room at this time now.  Reason for Disposition . History of prior "blood clot" in leg or lungs (i.e., deep vein thrombosis, pulmonary embolism)  Answer Assessment - Initial Assessment Questions 1. REASON FOR CALL or QUESTION: "What is your reason for calling today?" or "How can I best help you?" or "What question do you have that I can help answer?"     Patient's daughter called and just wanted to know if she should take her mother to the Emergency Room after 911 was at the home or if she should have an appointment at the PCP office.  Answer Assessment - Initial Assessment Questions 1. RESPIRATORY STATUS: "Describe your breathing?" (e.g., wheezing, shortness of breath, unable to speak, severe coughing)      Short of breath, light headed, dizzy, confused 2. ONSET: "When did this breathing problem begin?"      Prior to triage 3. PATTERN "Does the difficult breathing come and go, or has it been constant since it started?"      ---- 4. SEVERITY: "How bad is your breathing?" (e.g., mild, moderate, severe)    - MILD: No SOB at rest, mild SOB with walking, speaks normally in sentences, can lie down, no retractions, pulse < 100.    - MODERATE: SOB at rest, SOB with minimal exertion  and prefers to sit, cannot lie down flat, speaks in phrases, mild retractions, audible wheezing, pulse 100-120.    - SEVERE: Very SOB at rest, speaks in single words, struggling to breathe, sitting hunched forward, retractions, pulse > 120      "Patient felt light headed and dizzy when short of breath and was confused at one point" 5. RECURRENT SYMPTOM: "Have you had difficulty breathing before?" If Yes, ask: "When was the last time?" and  "What happened that time?"      ---- 6. CARDIAC HISTORY: "Do you have any history of heart disease?" (e.g., heart attack, angina, bypass surgery, angioplasty)      Hypertension, coronary artery disease 7. LUNG HISTORY: "Do you have any history of lung disease?"  (e.g., pulmonary embolus, asthma, emphysema)     Blood clot in lung diagnosed yesterday 8. CAUSE: "What do you think is causing the breathing problem?"      unknown 9. OTHER SYMPTOMS: "Do you have any other symptoms? (e.g., dizziness, runny nose, cough, chest pain, fever)     Dizziness, light headed 10. O2 SATURATION MONITOR:  "Do you use an oxygen saturation monitor (pulse oximeter) at home?" If Yes, ask: "What is your reading (oxygen level) today?" "What is your usual oxygen saturation reading?" (e.g., 95%)       ----  Protocols used: Information Only Call - No Triage-A-AH, Breathing Difficulty-A-AH

## 2023-11-06 ENCOUNTER — Ambulatory Visit: Admitting: Family Medicine

## 2023-11-06 ENCOUNTER — Encounter: Payer: Self-pay | Admitting: Family Medicine

## 2023-11-06 VITALS — BP 116/68 | HR 68 | Ht 67.0 in | Wt 148.4 lb

## 2023-11-06 DIAGNOSIS — I2699 Other pulmonary embolism without acute cor pulmonale: Secondary | ICD-10-CM

## 2023-11-06 NOTE — Patient Instructions (Signed)
 It was good to see you today- I am sorry this happened to you!   We will get you seen by hematology to discuss how long you need to be on the blood thinner. If you are running out prior to seeing hematology let me know and I will refill!  Please alert me if you are feeling worse or have any other concerns

## 2023-11-09 ENCOUNTER — Emergency Department (HOSPITAL_BASED_OUTPATIENT_CLINIC_OR_DEPARTMENT_OTHER)
Admission: EM | Admit: 2023-11-09 | Discharge: 2023-11-09 | Disposition: A | Discharge status: Home / Self Care | Attending: Emergency Medicine | Admitting: Emergency Medicine

## 2023-11-09 ENCOUNTER — Encounter (HOSPITAL_BASED_OUTPATIENT_CLINIC_OR_DEPARTMENT_OTHER): Payer: Self-pay

## 2023-11-09 DIAGNOSIS — I2510 Atherosclerotic heart disease of native coronary artery without angina pectoris: Secondary | ICD-10-CM | POA: Insufficient documentation

## 2023-11-09 DIAGNOSIS — Z7982 Long term (current) use of aspirin: Secondary | ICD-10-CM | POA: Insufficient documentation

## 2023-11-09 DIAGNOSIS — I10 Essential (primary) hypertension: Secondary | ICD-10-CM | POA: Diagnosis not present

## 2023-11-09 DIAGNOSIS — X58XXXA Exposure to other specified factors, initial encounter: Secondary | ICD-10-CM | POA: Diagnosis not present

## 2023-11-09 DIAGNOSIS — K5900 Constipation, unspecified: Secondary | ICD-10-CM | POA: Insufficient documentation

## 2023-11-09 DIAGNOSIS — R110 Nausea: Secondary | ICD-10-CM | POA: Diagnosis not present

## 2023-11-09 DIAGNOSIS — R319 Hematuria, unspecified: Secondary | ICD-10-CM | POA: Diagnosis present

## 2023-11-09 DIAGNOSIS — Z7984 Long term (current) use of oral hypoglycemic drugs: Secondary | ICD-10-CM | POA: Insufficient documentation

## 2023-11-09 DIAGNOSIS — E876 Hypokalemia: Secondary | ICD-10-CM | POA: Insufficient documentation

## 2023-11-09 DIAGNOSIS — E119 Type 2 diabetes mellitus without complications: Secondary | ICD-10-CM | POA: Insufficient documentation

## 2023-11-09 DIAGNOSIS — Z79899 Other long term (current) drug therapy: Secondary | ICD-10-CM | POA: Insufficient documentation

## 2023-11-09 DIAGNOSIS — S30814A Abrasion of vagina and vulva, initial encounter: Secondary | ICD-10-CM | POA: Diagnosis not present

## 2023-11-09 LAB — URINALYSIS, ROUTINE W REFLEX MICROSCOPIC
Bilirubin Urine: NEGATIVE
Glucose, UA: NEGATIVE mg/dL
Ketones, ur: NEGATIVE mg/dL
Nitrite: NEGATIVE
Protein, ur: 30 mg/dL — AB
Specific Gravity, Urine: 1.025 (ref 1.005–1.030)
pH: 5.5 (ref 5.0–8.0)

## 2023-11-09 LAB — COMPREHENSIVE METABOLIC PANEL WITH GFR
ALT: 14 U/L (ref 0–44)
AST: 29 U/L (ref 15–41)
Albumin: 4.2 g/dL (ref 3.5–5.0)
Alkaline Phosphatase: 99 U/L (ref 38–126)
Anion gap: 18 — ABNORMAL HIGH (ref 5–15)
BUN: 18 mg/dL (ref 8–23)
CO2: 21 mmol/L — ABNORMAL LOW (ref 22–32)
Calcium: 10 mg/dL (ref 8.9–10.3)
Chloride: 98 mmol/L (ref 98–111)
Creatinine, Ser: 1.09 mg/dL — ABNORMAL HIGH (ref 0.44–1.00)
GFR, Estimated: 57 mL/min — ABNORMAL LOW (ref >60)
Glucose, Bld: 139 mg/dL — ABNORMAL HIGH (ref 70–99)
Potassium: 3.2 mmol/L — ABNORMAL LOW (ref 3.5–5.1)
Sodium: 136 mmol/L (ref 135–145)
Total Bilirubin: 0.5 mg/dL (ref 0.0–1.2)
Total Protein: 7.2 g/dL (ref 6.5–8.1)

## 2023-11-09 LAB — CBC WITH DIFFERENTIAL/PLATELET
Abs Immature Granulocytes: 0.01 10*3/uL (ref 0.00–0.07)
Basophils Absolute: 0.1 10*3/uL (ref 0.0–0.1)
Basophils Relative: 1 %
Eosinophils Absolute: 0 10*3/uL (ref 0.0–0.5)
Eosinophils Relative: 0 %
HCT: 40.3 % (ref 36.0–46.0)
Hemoglobin: 13.8 g/dL (ref 12.0–15.0)
Immature Granulocytes: 0 %
Lymphocytes Relative: 42 %
Lymphs Abs: 3.1 10*3/uL (ref 0.7–4.0)
MCH: 30.9 pg (ref 26.0–34.0)
MCHC: 34.2 g/dL (ref 30.0–36.0)
MCV: 90.4 fL (ref 80.0–100.0)
Monocytes Absolute: 0.5 10*3/uL (ref 0.1–1.0)
Monocytes Relative: 7 %
Neutro Abs: 3.6 10*3/uL (ref 1.7–7.7)
Neutrophils Relative %: 50 %
Platelets: 257 10*3/uL (ref 150–400)
RBC: 4.46 MIL/uL (ref 3.87–5.11)
RDW: 13 % (ref 11.5–15.5)
WBC: 7.3 10*3/uL (ref 4.0–10.5)
nRBC: 0 % (ref 0.0–0.2)

## 2023-11-09 LAB — URINALYSIS, MICROSCOPIC (REFLEX)

## 2023-11-09 MED ORDER — POTASSIUM CHLORIDE CRYS ER 20 MEQ PO TBCR
40.0000 meq | EXTENDED_RELEASE_TABLET | Freq: Once | ORAL | Status: AC
  Administered 2023-11-09: 40 meq via ORAL
  Filled 2023-11-09: qty 2

## 2023-11-09 NOTE — ED Provider Notes (Signed)
 [place] [facility] Provider Note   CSN: [id number] Arrival date & time: 11/09/23  1023     History  Chief Complaint  Patient presents with   Hematuria    Margaret Sims is a 63 y.o. female with PMHx DM, HTN, HLD, tob use disorder, GERD, CAD, PE (10/2023) currently on blood thinners who presents to ED concerned for bright red blood on toilet paper. Patient endorses constipation x9 days but was able to finally pass some stool last night when she first noticed the bleeding. Patient endorses bright red blood on the toilet paper. Denies melena or gross bloody urine. Denies blood in the toilet bowl. Patient then urinated 2 more times since last night and still noticing blood on the toilet paper so she came to ED.  Patient also with PE diagnosed 4/28 and has been on blood thinners since. Patient endorses nausea over the past week which is now feeling better. Patient also stating that she had dyspnea earlier in the week which has been resolved since last Tuesday.  Denies fever, chest pain, cough, vomiting, diarrhea, dysuria, hematuria, hematochezia, abdominal pain.    Hematuria       Home Medications Prior to Admission medications   Medication Sig Start Date End Date Taking? Authorizing Provider  amLODipine  (NORVASC ) 10 MG tablet Take 1 tablet (10 mg total) by mouth daily. 10/06/23   Copland, Skipper Dumas, MD  APIXABAN  (ELIQUIS ) VTE STARTER PACK (10MG  AND 5MG ) Take as directed on package: start with two-5mg  tablets twice daily for 7 days. On day 8, switch to one-5mg  tablet twice daily. 11/03/23   Mozell Arias, MD  aspirin  81 MG tablet Take 81 mg by mouth daily.    [provider]  atorvastatin  (LIPITOR) 40 MG tablet Take 1 tablet (40 mg total) by mouth daily. 03/03/23   Copland, Jessica C, MD  Cholecalciferol (VITAMIN D3) 1.25 MG (50000 UT) CAPS Take 1 weekly for 12 weeks 09/04/23   Copland, Skipper Dumas, MD  cyclobenzaprine  (FLEXERIL ) 5 MG  tablet Take 2 tablets (10 mg total) by mouth at bedtime. Patient not taking: Reported on 11/06/2023 10/02/23   Bronson Canny, PA-C  FLUoxetine  (PROZAC ) 20 MG capsule Take 1 capsule (20 mg total) by mouth daily. Patient not taking: Reported on 11/06/2023 09/24/23   Copland, Skipper Dumas, MD  hydrochlorothiazide  (HYDRODIURIL ) 12.5 MG tablet Take 1 tablet (12.5 mg total) by mouth daily. 10/27/23   Copland, Skipper Dumas, MD  metFORMIN  (GLUCOPHAGE ) 500 MG tablet Take 1 tablet (500 mg total) by mouth daily. 03/03/23   Copland, Skipper Dumas, MD  omeprazole  (PRILOSEC) 40 MG capsule Take 1 capsule (40 mg total) by mouth daily. 03/03/23   Copland, Skipper Dumas, MD  ondansetron  (ZOFRAN -ODT) 4 MG disintegrating tablet Take 1 tablet (4 mg total) by mouth every 6 (six) hours as needed for nausea or vomiting. 11/04/23   Prosperi, Christian H, PA-C      Allergies    Lisinopril     Review of Systems   Review of Systems  Genitourinary:  Positive for hematuria.    Physical Exam Updated Vital Signs BP (!) 124/91 (BP Location: Right Arm)   Pulse 91   Temp 98.2 F (36.8 C) (Oral)   Resp 14   Ht 5\' 7"  (1.702 m)   Wt 66.7 kg   SpO2 100%   BMI 23.02 kg/m  Physical Exam Vitals and nursing note reviewed.  Constitutional:      General: She is not in acute  distress.    Appearance: She is not ill-appearing or toxic-appearing.  HENT:     Head: Normocephalic and atraumatic.     Mouth/Throat:     Mouth: Mucous membranes are moist.     Pharynx: No oropharyngeal exudate or posterior oropharyngeal erythema.  Eyes:     General: No scleral icterus.       Right eye: No discharge.        Left eye: No discharge.     Conjunctiva/sclera: Conjunctivae normal.  Cardiovascular:     Rate and Rhythm: Normal rate and regular rhythm.     Pulses: Normal pulses.     Heart sounds: Normal heart sounds. No murmur heard. Pulmonary:     Effort: Pulmonary effort is normal. No respiratory distress.     Breath sounds: Normal breath sounds. No  wheezing, rhonchi or rales.  Abdominal:     General: Abdomen is flat. Bowel sounds are normal. There is no distension.     Palpations: Abdomen is soft. There is no mass.     Tenderness: There is no abdominal tenderness.  Genitourinary:    Comments: RN Corbin Dess present to chaperone exam. Small abrasion on left labia minora. Gauze pressed on this area which produced some blood. Musculoskeletal:     Right lower leg: No edema.     Left lower leg: No edema.  Skin:    General: Skin is warm and dry.     Findings: No rash.  Neurological:     General: No focal deficit present.     Mental Status: She is alert and oriented to person, place, and time. Mental status is at baseline.  Psychiatric:        Mood and Affect: Mood normal.     ED Results / Procedures / Treatments   Labs (all labs ordered are listed, but only abnormal results are displayed) Labs Reviewed  URINALYSIS, ROUTINE W REFLEX MICROSCOPIC - Abnormal; Notable for the following components:      Result Value   Hgb urine dipstick LARGE (*)    Protein, ur 30 (*)    Leukocytes,Ua SMALL (*)    All other components within normal limits  COMPREHENSIVE METABOLIC PANEL WITH GFR - Abnormal; Notable for the following components:   Potassium 3.2 (*)    CO2 21 (*)    Glucose, Bld 139 (*)    Creatinine, Ser 1.09 (*)    GFR, Estimated 57 (*)    Anion gap 18 (*)    All other components within normal limits  URINALYSIS, MICROSCOPIC (REFLEX) - Abnormal; Notable for the following components:   Bacteria, UA FEW (*)    All other components within normal limits  CBC WITH DIFFERENTIAL/PLATELET    EKG None  Radiology No results found.  Procedures Procedures    Medications Ordered in ED Medications  potassium chloride SA (KLOR-CON M) CR tablet 40 mEq (has no administration in time range)    ED Course/ Medical Decision Making/ A&P                                 Medical Decision Making Amount and/or Complexity of Data  Reviewed Labs: ordered.  Risk Prescription drug management.   This patient presents to the ED for concern of hematuria, this involves an extensive number of treatment options, and is a complaint that carries with it a high risk of complications and morbidity.  The differential diagnosis includes UTI, kidney stones,  CAD/trauma, bladder cancer, medication ADR   Co morbidities that complicate the patient evaluation  DM, HTN, HLD, tob use disorder, GERD, CAD, PE (10/2023) currently on blood thinners   Additional history obtained:  Additional history obtained from 4/28 ED note: patient diagnosed with PE and currently on Eliquis     Problem List / ED Course / Critical interventions / Medication management  Patient presents to ED concerned for bright red blood on toilet paper since yesterday. Patient does have very long and sharp fingernails and states that she may have scratched something to make it bleed. Denies gross hematuria or melena/blood in stool. Denies abdominal pain or any other infectious symptoms today.  Physical exam showing a small abrasion on the left labia minora.  This area is slowly oozing blood and blood is expressed when pressing gauze on top of it.  I Ordered, and personally interpreted labs.  CBC without leukocytosis or anemia.  CMP with mild hypokalemia at 3.2.  Bicarb also mildly low at 21.  Creatinine mildly elevated but is resolving from last week's results.  UA with large hemoglobin, small leukocytes, few bacteria. It appears the patient symptoms are from a small vaginal abrasion.  Shared all results with patient.  Recommended following up with PCP for repeat UA.  Patient verbalizes understanding and states that she has a follow-up appointment later this week.  Provided patient with a dose of oral potassium for her mild hypokalemia.  Shared decision making with patient who would like to withhold antibiotics for the small leukocytes and few bacteria found in her urine  today.  Patient would rather follow-up with her PCP for repeat testing. I have reviewed the patients home medicines and have made adjustments as needed The patient has been appropriately medically screened and/or stabilized in the ED. I have low suspicion for any other emergent medical condition which would require further screening, evaluation or treatment in the ED or require inpatient management. At time of discharge the patient is hemodynamically stable and in no acute distress. I have discussed work-up results and diagnosis with patient and answered all questions. Patient is agreeable with discharge plan. We discussed strict return precautions for returning to the emergency department and they verbalized understanding.     Social Determinants of Health:  none         Final Clinical Impression(s) / ED Diagnoses Final diagnoses:  Abrasion of vagina, initial encounter    Rx / DC Orders ED Discharge Orders     None          B[place], [place] 11/09/23 1259    Lowery Rue, DO 11/09/23 1321

## 2023-11-09 NOTE — ED Notes (Signed)
Reviewed discharge instructions and follow up with pt. Pt states understanding. Ambulatory at time of discharge

## 2023-11-09 NOTE — ED Triage Notes (Signed)
 Pt reports that she has noted some blood on the tissue the last 2 days. Denies vomiting. States that she is a little nauseated and having some constipation.

## 2023-11-09 NOTE — Discharge Instructions (Signed)
 It was pleasure caring for you today.  Please follow-up with your primary care provider.  Seek emergency care if experiencing any new or worsening symptoms.

## 2023-11-10 ENCOUNTER — Inpatient Hospital Stay: Admitting: Family Medicine

## 2023-11-11 ENCOUNTER — Ambulatory Visit
Admission: EM | Admit: 2023-11-11 | Discharge: 2023-11-11 | Disposition: A | Discharge status: Home / Self Care | Attending: Family Medicine | Admitting: Family Medicine

## 2023-11-11 ENCOUNTER — Emergency Department (HOSPITAL_COMMUNITY)

## 2023-11-11 ENCOUNTER — Encounter (HOSPITAL_COMMUNITY): Payer: Self-pay

## 2023-11-11 ENCOUNTER — Emergency Department (HOSPITAL_COMMUNITY)
Admission: EM | Admit: 2023-11-11 | Discharge: 2023-11-12 | Disposition: A | Discharge status: Home / Self Care | Attending: Emergency Medicine | Admitting: Emergency Medicine

## 2023-11-11 ENCOUNTER — Other Ambulatory Visit: Payer: Self-pay

## 2023-11-11 DIAGNOSIS — R42 Dizziness and giddiness: Secondary | ICD-10-CM | POA: Diagnosis present

## 2023-11-11 DIAGNOSIS — Z7901 Long term (current) use of anticoagulants: Secondary | ICD-10-CM | POA: Insufficient documentation

## 2023-11-11 DIAGNOSIS — R0602 Shortness of breath: Secondary | ICD-10-CM | POA: Insufficient documentation

## 2023-11-11 DIAGNOSIS — I2699 Other pulmonary embolism without acute cor pulmonale: Secondary | ICD-10-CM

## 2023-11-11 DIAGNOSIS — Z7982 Long term (current) use of aspirin: Secondary | ICD-10-CM | POA: Insufficient documentation

## 2023-11-11 DIAGNOSIS — E119 Type 2 diabetes mellitus without complications: Secondary | ICD-10-CM | POA: Insufficient documentation

## 2023-11-11 DIAGNOSIS — I959 Hypotension, unspecified: Secondary | ICD-10-CM | POA: Insufficient documentation

## 2023-11-11 DIAGNOSIS — Z7984 Long term (current) use of oral hypoglycemic drugs: Secondary | ICD-10-CM | POA: Insufficient documentation

## 2023-11-11 DIAGNOSIS — Z79899 Other long term (current) drug therapy: Secondary | ICD-10-CM | POA: Insufficient documentation

## 2023-11-11 DIAGNOSIS — R000 Tachycardia, unspecified: Secondary | ICD-10-CM

## 2023-11-11 DIAGNOSIS — R0689 Other abnormalities of breathing: Secondary | ICD-10-CM

## 2023-11-11 DIAGNOSIS — I10 Essential (primary) hypertension: Secondary | ICD-10-CM | POA: Insufficient documentation

## 2023-11-11 LAB — TROPONIN I (HIGH SENSITIVITY)
Troponin I (High Sensitivity): 11 ng/L (ref <18)
Troponin I (High Sensitivity): 9 ng/L (ref <18)

## 2023-11-11 LAB — COMPREHENSIVE METABOLIC PANEL WITH GFR
ALT: 12 U/L (ref 0–44)
AST: 25 U/L (ref 15–41)
Albumin: 3 g/dL — ABNORMAL LOW (ref 3.5–5.0)
Alkaline Phosphatase: 59 U/L (ref 38–126)
Anion gap: 11 (ref 5–15)
BUN: 21 mg/dL (ref 8–23)
CO2: 24 mmol/L (ref 22–32)
Calcium: 8.9 mg/dL (ref 8.9–10.3)
Chloride: 104 mmol/L (ref 98–111)
Creatinine, Ser: 1.56 mg/dL — ABNORMAL HIGH (ref 0.44–1.00)
GFR, Estimated: 37 mL/min — ABNORMAL LOW (ref >60)
Glucose, Bld: 88 mg/dL (ref 70–99)
Potassium: 3.5 mmol/L (ref 3.5–5.1)
Sodium: 139 mmol/L (ref 135–145)
Total Bilirubin: 0.5 mg/dL (ref 0.0–1.2)
Total Protein: 5.8 g/dL — ABNORMAL LOW (ref 6.5–8.1)

## 2023-11-11 LAB — CBC WITH DIFFERENTIAL/PLATELET
Abs Immature Granulocytes: 0.03 10*3/uL (ref 0.00–0.07)
Basophils Absolute: 0.1 10*3/uL (ref 0.0–0.1)
Basophils Relative: 1 %
Eosinophils Absolute: 0.1 10*3/uL (ref 0.0–0.5)
Eosinophils Relative: 1 %
HCT: 33.2 % — ABNORMAL LOW (ref 36.0–46.0)
Hemoglobin: 11 g/dL — ABNORMAL LOW (ref 12.0–15.0)
Immature Granulocytes: 0 %
Lymphocytes Relative: 42 %
Lymphs Abs: 3.7 10*3/uL (ref 0.7–4.0)
MCH: 31.3 pg (ref 26.0–34.0)
MCHC: 33.1 g/dL (ref 30.0–36.0)
MCV: 94.3 fL (ref 80.0–100.0)
Monocytes Absolute: 0.7 10*3/uL (ref 0.1–1.0)
Monocytes Relative: 7 %
Neutro Abs: 4.4 10*3/uL (ref 1.7–7.7)
Neutrophils Relative %: 49 %
Platelets: 218 10*3/uL (ref 150–400)
RBC: 3.52 MIL/uL — ABNORMAL LOW (ref 3.87–5.11)
RDW: 13.4 % (ref 11.5–15.5)
WBC: 8.9 10*3/uL (ref 4.0–10.5)
nRBC: 0 % (ref 0.0–0.2)

## 2023-11-11 LAB — BRAIN NATRIURETIC PEPTIDE: B Natriuretic Peptide: 7 pg/mL (ref 0.0–100.0)

## 2023-11-11 LAB — LACTIC ACID, PLASMA
Lactic Acid, Venous: 1.1 mmol/L (ref 0.5–1.9)
Lactic Acid, Venous: 0.8 mmol/L (ref 0.5–1.9)

## 2023-11-11 MED ORDER — SODIUM CHLORIDE 0.9 % IV BOLUS
1000.0000 mL | Freq: Once | INTRAVENOUS | Status: AC
  Administered 2023-11-11: 1000 mL via INTRAVENOUS

## 2023-11-11 MED ORDER — IOHEXOL 350 MG/ML SOLN
50.0000 mL | Freq: Once | INTRAVENOUS | Status: AC | PRN
  Administered 2023-11-11: 50 mL via INTRAVENOUS

## 2023-11-11 MED ORDER — APIXABAN 5 MG PO TABS
5.0000 mg | ORAL_TABLET | Freq: Once | ORAL | Status: DC
Start: 2023-11-11 — End: 2023-11-12

## 2023-11-11 NOTE — ED Provider Notes (Signed)
 [name]  Note:  This document was prepared using Conservation officer, historic buildings and may include unintentional dictation errors.  MRN: [id number] DOB: [birth year]  Subjective:   Margaret Sims is a 63 y.o. female presenting for acute onset of shortness of breath, chest tightness, dizziness after taking a new blood pressure medicine today.  She has taken losartan  50 mg.  On 11/03/2023, patient was found to have a pulmonary embolism.  She was started on apixaban  and has been taking this for the past 6 days.    No current facility-administered medications for this encounter.  Current Outpatient Medications:    amLODipine  (NORVASC ) 10 MG tablet, Take 1 tablet (10 mg total) by mouth daily., Disp: 90 tablet, Rfl: 3   APIXABAN  (ELIQUIS ) VTE STARTER PACK (10MG  AND 5MG ), Take as directed on package: start with two-5mg  tablets twice daily for 7 days. On day 8, switch to one-5mg  tablet twice daily., Disp: 74 each, Rfl: 0   aspirin  81 MG tablet, Take 81 mg by mouth daily., Disp: , Rfl:    atorvastatin  (LIPITOR) 40 MG tablet, Take 1 tablet (40 mg total) by mouth daily., Disp: 90 tablet, Rfl: 3   Cholecalciferol (VITAMIN D3) 1.25 MG (50000 UT) CAPS, Take 1 weekly for 12 weeks, Disp: 12 capsule, Rfl: 0   hydrochlorothiazide  (HYDRODIURIL ) 12.5 MG tablet, Take 1 tablet (12.5 mg total) by mouth daily., Disp: 90 tablet, Rfl: 3   losartan  (COZAAR ) 50 MG tablet, Take 50 mg by mouth daily., Disp: , Rfl:    omeprazole  (PRILOSEC) 40 MG capsule, Take 1 capsule (40 mg total) by mouth daily., Disp: 90 capsule, Rfl: 3   cyclobenzaprine  (FLEXERIL ) 5 MG tablet, Take 2 tablets (10 mg total) by mouth at bedtime. (Patient not taking: Reported on 11/06/2023), Disp: 30 tablet, Rfl: 0   FLUoxetine  (PROZAC ) 20 MG capsule, Take 1 capsule (20 mg total) by mouth daily. 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 IMPRESSION: Colonoscopy reveals multiple areas of diverticulosis. Will treat for suspected diverticulitis. RTO if sxs worsen or do not improve. gwt 03/05/2012   Dyslipidemia 02/19/2016   Encounter for gynecological examination 12/07/2018   Essential hypertension 02/19/2016   High cholesterol    Hypercholesterolemia 06/23/2013   Formatting of this note might be different from the original. IMPRESSION: The goal is to have your total cholesterol < 200, the HDL (good cholesterol) >40, and the LDL (bad cholesterol) <100.  And triglycerides should be under 150.`E1o3L`It is recommended that you follow a good diet low in animal and dairy fat and exercise for at least 45 minutes a day 5-6 days a week. `E1o3L`*LDL still high at 14   Obesity 12/07/2018   Syncope and collapse 01/17/2022   Tobacco use disorder 02/19/2016   Type 2 diabetes mellitus with diabetic neuropathy, unspecified (HCC) 05/01/2020   Ventricular  tachycardia (HCC) 02/21/2022     Past Surgical History:  Procedure Laterality Date   CESAREAN SECTION     COLONOSCOPY  2014   normal    Family History  Problem Relation Age of Onset   Stroke Maternal Grandmother    Colon cancer Neg Hx    Colon polyps Neg Hx    Esophageal cancer Neg Hx    Rectal cancer Neg Hx    Stomach cancer Neg Hx     Social History   Tobacco Use   Smoking status: Every Day    Current packs/day: 0.00    Types: Cigarettes    Last attempt to quit: 09/05/2021     Years since quitting: 2.1   Smokeless tobacco: Never  Vaping Use   Vaping status: Never Used  Substance Use Topics   Alcohol use: Yes    Comment: 1-2 times per week   Drug use: Never    ROS   Objective:   Vitals: BP (!) 79/53 (BP Location: Left Arm) Comment: 82/61  Pulse (!) 112   Temp (!) 97.5 F (36.4 C) (Oral)   Resp 16   SpO2 98%   Wt Readings from Last 3 Encounters:  11/09/23 147 lb (66.7 kg)  11/06/23 148 lb 6.4 oz (67.3 kg)  11/03/23 153 lb (69.4 kg)   Temp Readings from Last 3 Encounters:  11/11/23 (!) 97.5 F (36.4 C) (Oral)  11/09/23 98.2 F (36.8 C) (Oral)  11/04/23 97.7 F (36.5 C)   BP Readings from Last 3 Encounters:  11/11/23 (!) 79/53  11/09/23 (!) 122/90  11/06/23 116/68   Pulse Readings from Last 3 Encounters:  11/11/23 (!) 112  11/09/23 65  11/06/23 68     Physical Exam Constitutional:      General: She is in acute distress.     Appearance: Normal appearance. She is well-developed. She is not ill-appearing, toxic-appearing or diaphoretic.  HENT:     Head: Normocephalic and atraumatic.     Nose: Nose normal.     Mouth/Throat:     Mouth: Mucous membranes are moist.  Eyes:     General: No scleral icterus.       Right eye: No discharge.        Left eye: No discharge.     Extraocular Movements: Extraocular movements intact.  Cardiovascular:     Rate and Rhythm: Regular rhythm. Tachycardia present.     Heart sounds: Normal heart sounds. No murmur heard.    No friction rub. No gallop.  Pulmonary:     Effort: No respiratory distress.     Breath sounds: No stridor. No wheezing, rhonchi or rales.     Comments: Intermittent gasping for air but not continuous. Chest:     Chest wall: No tenderness.  Skin:    General: Skin is warm and dry.  Neurological:     General: No focal deficit present.     Mental Status: She is alert and oriented to person, place, and time.  Psychiatric:        Mood and Affect: Mood normal.         Behavior: Behavior normal.     ED ECG REPORT   Date: 11/11/2023  EKG Time: 3:08 PM  Rate: 110bpm  Rhythm: sinus tachycardia  Axis: left  Intervals:none  ST&T Change: T wave inversion in aVL, T wave flattening in V2  Narrative Interpretation: Sinus tachycardia at 110 bpm with possible left atrial enlargement, left axis deviation and nonspecific T wave changes  as above.  Comparable to previous EKG including the tachycardia.  IV line established.  Assessment and Plan :   PDMP not reviewed this encounter.  1. Shortness of breath   2. Gasping for breath   3. Hypotension, unspecified hypotension type   4. Tachycardia   5. Acute pulmonary embolism, unspecified pulmonary embolism type, unspecified whether acute cor pulmonale present [facility])    Patient is high risk given her recently found acute pulmonary embolism and presents with concerning symptoms for an ongoing if not you acute cardiopulmonary event.  She is not hemodynamically stable as she is hypotensive and tachycardic.  It is possible that this may be solely related to the blood pressure medication she started this morning but given the persistent nature of her symptoms, obvious intermittent gasping for air, hemodynamically unstable vital signs and recent history of a pulmonary embolism recommend further evaluation through the emergency room.  Patient is to be transferred by EMS.  Case reported out, transfer of care complete.   Adolph Hoop, [place] 11/11/23 [phone]

## 2023-11-11 NOTE — ED Notes (Signed)
 Patient transported to CT

## 2023-11-11 NOTE — ED Provider Notes (Signed)
 [place] EMERGENCY DEPARTMENT AT [facility] Provider Note   CSN: [id number] Arrival date & time: 11/11/23  4098     History  Chief Complaint  Patient presents with   Hypotension    Pt BIB EMS from UC for hypotension. Pt started losartan  this morning around 0800. Began feeling lightheaded and dizzy that did not resolve. Pt went to UC and was referred to ER. EMS gave 700mg  NS. PMH: PE, HTN, DM, HLD,GERD,    Margaret Sims is a 63 y.o. female.  The history is provided by the patient and medical records. No language interpreter was used.  Near Syncope This is a new problem. The problem occurs rarely. The problem has not changed since onset.Associated symptoms include shortness of breath (brief). Pertinent negatives include no chest pain, no abdominal pain and no headaches. Nothing aggravates the symptoms. Nothing relieves the symptoms. She has tried nothing for the symptoms. The treatment provided no relief.       Home Medications Prior to Admission medications   Medication Sig Start Date End Date Taking? Authorizing Provider  amLODipine  (NORVASC ) 10 MG tablet Take 1 tablet (10 mg total) by mouth daily. 10/06/23   Copland, Skipper Dumas, MD  APIXABAN  (ELIQUIS ) VTE STARTER PACK (10MG  AND 5MG ) Take as directed on package: start with two-5mg  tablets twice daily for 7 days. On day 8, switch to one-5mg  tablet twice daily. 11/03/23   Mozell Arias, MD  aspirin  81 MG tablet Take 81 mg by mouth daily.    [provider]  atorvastatin  (LIPITOR) 40 MG tablet Take 1 tablet (40 mg total) by mouth daily. 03/03/23   Copland, Jessica C, MD  Cholecalciferol (VITAMIN D3) 1.25 MG (50000 UT) CAPS Take 1 weekly for 12 weeks 09/04/23   Copland, Skipper Dumas, MD  cyclobenzaprine  (FLEXERIL ) 5 MG tablet Take 2 tablets (10 mg total) by mouth at bedtime. Patient not taking: Reported on 11/06/2023 10/02/23   Bronson Canny, PA-C  FLUoxetine  (PROZAC ) 20 MG capsule Take 1 capsule (20 mg total) by  mouth daily. Patient not taking: Reported on 11/06/2023 09/24/23   Copland, Skipper Dumas, MD  hydrochlorothiazide  (HYDRODIURIL ) 12.5 MG tablet Take 1 tablet (12.5 mg total) by mouth daily. 10/27/23   Copland, Skipper Dumas, MD  losartan  (COZAAR ) 50 MG tablet Take 50 mg by mouth daily. 11/10/23   [provider]  metFORMIN  (GLUCOPHAGE ) 500 MG tablet Take 1 tablet (500 mg total) by mouth daily. 03/03/23   Copland, Skipper Dumas, MD  omeprazole  (PRILOSEC) 40 MG capsule Take 1 capsule (40 mg total) by mouth daily. 03/03/23   Copland, Skipper Dumas, MD  ondansetron  (ZOFRAN -ODT) 4 MG disintegrating tablet Take 1 tablet (4 mg total) by mouth every 6 (six) hours as needed for nausea or vomiting. 11/04/23   Prosperi, Christian H, PA-C      Allergies    Lisinopril     Review of Systems   Review of Systems  Constitutional:  Positive for fatigue. Negative for chills and fever.  HENT:  Negative for congestion.   Respiratory:  Positive for chest tightness and shortness of breath (brief). Negative for cough and wheezing.   Cardiovascular:  Positive for near-syncope. Negative for chest pain, palpitations and leg swelling.  Gastrointestinal:  Negative for abdominal pain, blood in stool, constipation, diarrhea, nausea and vomiting.  Genitourinary:  Negative for dysuria.  Musculoskeletal:  Negative for back pain, neck pain and neck stiffness.  Skin:  Negative for rash and wound.  Neurological:  Positive for light-headedness.  Negative for seizures, syncope, weakness and headaches.  Psychiatric/Behavioral:  Negative for agitation.   All other systems reviewed and are negative.   Physical Exam Updated Vital Signs Ht 5\' 7"  (1.702 m)   Wt 66.7 kg   SpO2 98%   BMI 23.02 kg/m  Physical Exam Vitals and nursing note reviewed.  Constitutional:      General: She is not in acute distress.    Appearance: She is well-developed. She is not ill-appearing, toxic-appearing or diaphoretic.  HENT:     Head: Normocephalic and  atraumatic.     Nose: No congestion or rhinorrhea.     Mouth/Throat:     Mouth: Mucous membranes are moist.     Pharynx: No oropharyngeal exudate or posterior oropharyngeal erythema.  Eyes:     Extraocular Movements: Extraocular movements intact.     Conjunctiva/sclera: Conjunctivae normal.     Pupils: Pupils are equal, round, and reactive to light.  Cardiovascular:     Rate and Rhythm: Normal rate and regular rhythm.     Pulses: Normal pulses.     Heart sounds: No murmur heard. Pulmonary:     Effort: Pulmonary effort is normal. No respiratory distress.     Breath sounds: Normal breath sounds. No wheezing, rhonchi or rales.  Chest:     Chest wall: No tenderness.  Abdominal:     General: Abdomen is flat.     Palpations: Abdomen is soft.     Tenderness: There is no abdominal tenderness. There is no right CVA tenderness, left CVA tenderness, guarding or rebound.  Musculoskeletal:        General: No swelling or tenderness.     Cervical back: Neck supple. No tenderness.     Right lower leg: No edema.     Left lower leg: No edema.  Skin:    General: Skin is warm and dry.     Capillary Refill: Capillary refill takes less than 2 seconds.     Findings: No erythema or rash.  Neurological:     General: No focal deficit present.     Mental Status: She is alert.     Sensory: No sensory deficit.     Motor: No weakness.  Psychiatric:        Mood and Affect: Mood normal.     ED Results / Procedures / Treatments   Labs (all labs ordered are listed, but only abnormal results are displayed) Labs Reviewed  CBC WITH DIFFERENTIAL/PLATELET - Abnormal; Notable for the following components:      Result Value   RBC 3.52 (*)    Hemoglobin 11.0 (*)    HCT 33.2 (*)    All other components within normal limits  COMPREHENSIVE METABOLIC PANEL WITH GFR - Abnormal; Notable for the following components:   Creatinine, Ser 1.56 (*)    Total Protein 5.8 (*)    Albumin 3.0 (*)    GFR, Estimated 37  (*)    All other components within normal limits  BRAIN NATRIURETIC PEPTIDE  LACTIC ACID, PLASMA  LACTIC ACID, PLASMA  TROPONIN I (HIGH SENSITIVITY)  TROPONIN I (HIGH SENSITIVITY)    EKG None  Radiology CT Angio Chest PE W and/or Wo Contrast Result Date: 11/11/2023 CLINICAL DATA:  High probability for PE.  Hypotensive. EXAM: CT ANGIOGRAPHY CHEST WITH CONTRAST TECHNIQUE: Multidetector CT imaging of the chest was performed using the standard protocol during bolus administration of intravenous contrast. Multiplanar CT image reconstructions and MIPs were obtained to evaluate the vascular anatomy. RADIATION DOSE  REDUCTION: This exam was performed according to the departmental dose-optimization program which includes automated exposure control, adjustment of the mA and/or kV according to patient size and/or use of iterative reconstruction technique. CONTRAST:  50mL OMNIPAQUE  IOHEXOL  350 MG/ML SOLN COMPARISON:  CT angiogram chest 11/03/2023. FINDINGS: Cardiovascular: Satisfactory opacification of the pulmonary arteries to the segmental level. No evidence of pulmonary embolism. Normal heart size. No pericardial effusion. There are atherosclerotic calcifications of the aorta. Mediastinum/Nodes: No enlarged mediastinal, hilar, or axillary lymph nodes. Thyroid  gland, trachea, and esophagus demonstrate no significant findings. Lungs/Pleura: Mild emphysema present. There are mild ground-glass opacities in the dependent portions of the bilateral lower lobes. There is no pleural effusion or pneumothorax. Upper Abdomen: No acute abnormality. Musculoskeletal: No chest wall abnormality. No acute or significant osseous findings. Review of the MIP images confirms the above findings. IMPRESSION: 1. No evidence for pulmonary embolism. 2. Mild ground-glass opacities in the dependent portions of the bilateral lower lobes, likely atelectasis. Aortic Atherosclerosis (ICD10-I70.0) and Emphysema (ICD10-J43.9). Electronically  Signed   By: Tyron Gallon M.D.   On: 11/11/2023 21:22    Procedures Procedures    Medications Ordered in ED Medications  apixaban  (ELIQUIS ) tablet 5 mg (has no administration in time range)  sodium chloride  0.9 % bolus 1,000 mL (0 mLs Intravenous Stopped 11/11/23 1955)  iohexol  (OMNIPAQUE ) 350 MG/ML injection 50 mL (50 mLs Intravenous Contrast Given 11/11/23 2114)    ED Course/ Medical Decision Making/ A&P                                 Medical Decision Making Amount and/or Complexity of Data Reviewed Labs: ordered. Radiology: ordered.  Risk Prescription drug management.    CHELSY CHOUDHURY is a 63 y.o. female with a past medical history significant for hypertension, dyslipidemia, diabetes, previous ventricular tachycardia, and recent diagnosis of pulmonary emboli now on Eliquis  therapy who presents for lightheadedness, near syncope, hypotension, and shortness of breath.  According to patient, she was recently started on losartan  and took her first dose today due to elevated blood pressures.  After taking it, she says she was feeling fatigued and lightheaded.  She tried to stand up and nearly passed out.  She reports she was having chest tightness and shortness of breath but denies significant pain.  She reports she has not had any pain when she had her large pulmonary emboli but just had the shortness of breath and feel similar.  According to documentation, blood pressures were found to be very low in the 70s and 80s initially with EMS and blood pressure has started to improve with some fluids.  She is denying any other fevers, chills, congestion, cough, nausea, vomiting, constipation, diarrhea, or urinary changes.  Denies leg pain or leg swelling.  On exam, lungs were relatively clear and chest was nontender.  No murmur.  Abdomen nontender.  Minimal edema in legs.  Patient resting but blood pressure is right on the 100 systolic.  She reports her blood pressure frequently is at 200 so  this is very different for her.  She still feels lightheaded.  She is not having shortness of breath now but did have the intense episode earlier.  EKG does not show STEMI.  Given her recent pulmonary embolism with an episode of hypotension, shortness of breath, and near syncope, I do feel need to get repeat imaging and labs.  I discussed with her and family that this could  just be the new losartan  because her blood pressure dropped too far and she was orthostatic and symptomatic.  If her symptoms did not improve and her work is reoccurring, she may be safe for discharge home to stop that medication but will get workup to rule out other concerning finding.   CT scan showed no pulmonary embolism.  Labs showed slightly decreased hemoglobin.  To prior but no reported sources of bleeding.  She is feeling well now.  Blood pressure has been over 100 systolic the entire time she is been here now.  Patient is feeling better and would like to go home.  She will call her PCP tomorrow to discuss blood pressure medication titration and avoid the losartan .  She will increase her hydration.  She understood return precautions and follow-up instructions and was discharged in stable condition.  She requested her home evening dose of Eliquis  which you ordered for her to take before she leaves.        Final Clinical Impression(s) / ED Diagnoses Final diagnoses:  Intermittent lightheadedness  Hypotension, unspecified hypotension type    Rx / DC Orders ED Discharge Orders     None       Clinical Impression: 1. Intermittent lightheadedness   2. Hypotension, unspecified hypotension type     Disposition: Discharge  Condition: Good  I have discussed the results, Dx and Tx plan with the pt(& family if present). He/she/they expressed understanding and agree(s) with the plan. Discharge instructions discussed at great length. Strict return precautions discussed and pt &/or family have verbalized  understanding of the instructions. No further questions at time of discharge.    New Prescriptions   No medications on file    Follow Up: Copland, Skipper Dumas, MD [address] [place] [address] [phone]     [facility] Emergency Department at [facility] [address] [place]  [address] [phone]        Hali Balgobin, Marine Sia, MD 11/11/23 [phone]

## 2023-11-11 NOTE — ED Triage Notes (Signed)
 Patient reports SOB and dizziness started this morning after taking potassium.

## 2023-11-11 NOTE — ED Triage Notes (Signed)
 Patient states she was seen at the ED 2 days ago.

## 2023-11-11 NOTE — ED Notes (Signed)
 IVF started-[facility] here for transport

## 2023-11-11 NOTE — Discharge Instructions (Signed)
 Your history, exam, and workup today seem consistent with low blood pressure due to the new blood pressure medicine you started today leading to your lightheadedness and symptoms.  Your CT scan did not show evidence of new blood clots and your labs are otherwise similar to prior and overall reassuring.  You had no sources of bleeding although your hemoglobin did drop slightly.  Please follow-up with your primary doctor for this.  Please call them to discuss blood pressure medication titration and if her blood pressure is still soft tomorrow, I would hold on blood pressure medication.  Please avoid the losartan  as I suspect this caused it.  Please rest and stay hydrated and talk to your team tomorrow.  If any symptoms change or worsen acutely, please turn to the nearest emergency department.

## 2023-11-13 ENCOUNTER — Inpatient Hospital Stay: Attending: Medical Oncology

## 2023-11-13 ENCOUNTER — Inpatient Hospital Stay (HOSPITAL_BASED_OUTPATIENT_CLINIC_OR_DEPARTMENT_OTHER): Admitting: Medical Oncology

## 2023-11-13 ENCOUNTER — Encounter: Payer: Self-pay | Admitting: Medical Oncology

## 2023-11-13 VITALS — BP 105/63 | HR 99 | Temp 98.2°F | Resp 18 | Ht 67.0 in | Wt 152.0 lb

## 2023-11-13 DIAGNOSIS — I2693 Single subsegmental pulmonary embolism without acute cor pulmonale: Secondary | ICD-10-CM | POA: Diagnosis not present

## 2023-11-13 DIAGNOSIS — Z87891 Personal history of nicotine dependence: Secondary | ICD-10-CM | POA: Diagnosis not present

## 2023-11-13 DIAGNOSIS — Z5181 Encounter for therapeutic drug level monitoring: Secondary | ICD-10-CM

## 2023-11-13 DIAGNOSIS — D649 Anemia, unspecified: Secondary | ICD-10-CM | POA: Insufficient documentation

## 2023-11-13 DIAGNOSIS — Z7901 Long term (current) use of anticoagulants: Secondary | ICD-10-CM

## 2023-11-13 LAB — FOLATE: Folate: 6.7 ng/mL (ref >5.9)

## 2023-11-13 LAB — CBC
HCT: 36.4 % (ref 36.0–46.0)
Hemoglobin: 12.1 g/dL (ref 12.0–15.0)
MCH: 31.3 pg (ref 26.0–34.0)
MCHC: 33.2 g/dL (ref 30.0–36.0)
MCV: 94.1 fL (ref 80.0–100.0)
Platelets: 247 10*3/uL (ref 150–400)
RBC: 3.87 MIL/uL (ref 3.87–5.11)
RDW: 13.3 % (ref 11.5–15.5)
WBC: 7.3 10*3/uL (ref 4.0–10.5)
nRBC: 0 % (ref 0.0–0.2)

## 2023-11-13 LAB — CMP (CANCER CENTER ONLY)
ALT: 12 U/L (ref 0–44)
AST: 20 U/L (ref 15–41)
Albumin: 4.4 g/dL (ref 3.5–5.0)
Alkaline Phosphatase: 81 U/L (ref 38–126)
Anion gap: 8 (ref 5–15)
BUN: 14 mg/dL (ref 8–23)
CO2: 30 mmol/L (ref 22–32)
Calcium: 9.8 mg/dL (ref 8.9–10.3)
Chloride: 102 mmol/L (ref 98–111)
Creatinine: 1.06 mg/dL — ABNORMAL HIGH (ref 0.44–1.00)
GFR, Estimated: 59 mL/min — ABNORMAL LOW (ref >60)
Glucose, Bld: 116 mg/dL — ABNORMAL HIGH (ref 70–99)
Potassium: 3.5 mmol/L (ref 3.5–5.1)
Sodium: 140 mmol/L (ref 135–145)
Total Bilirubin: 0.3 mg/dL (ref 0.0–1.2)
Total Protein: 6.7 g/dL (ref 6.5–8.1)

## 2023-11-13 LAB — FERRITIN: Ferritin: 63 ng/mL (ref 11–307)

## 2023-11-13 LAB — VITAMIN B12: Vitamin B-12: 372 pg/mL (ref 180–914)

## 2023-11-13 NOTE — Progress Notes (Signed)
 [facility] [phone]   [phone]  INITIAL CONSULT NOTE  Patient Care Team: Copland, Skipper Dumas, MD as PCP - General (Family Medicine)  CHIEF COMPLAINTS/PURPOSE OF CONSULTATION:  Pulmonary Embolism  HISTORY OF PRESENTING ILLNESS:  Margaret Sims 63 y.o. female is referred to our office by her PCP Dr. Geralyn Knee for PE management:   She reports that symptoms started with dizziness and lightheadedness. She was seen in the ER on 11/03/2023 and diagnosed with a pulmonary embolism as shown below. She did not have any evaluation for potential DVT and heart strain was not suspected. She was started on Eliquis  10 mg BID x 7 days then Eliquis  5 mg BID.   She reports that this is her first ever clotting event Provoking factors include tobacco use (She smokes off and on- now has quite for the past month- was smoking a lot around the time of the pulmonary embolism), immobility secondary to long car ride from [place]  to [place]. No recent surgeries. She is not on any hormonal medications She is not being treated for any known malignancies No family history of bleeding or clotting disorders.   Today she reports that in general she is feeling a bit better. SOB, lightheadedness has improved. She reports no calf/knee pain, leg swelling, chest pain, hemoptysis, headaches. She did have some labial bleeding following an abrasion and was seen in the ER on 11/09/2023. This has resolved and she denies epistaxis, gingivitis, hemoptysis, hematemesis, hematuria, melena, excessive bruising, blood donation. She is otherwise tolerating her Eliquis  well.    11/03/2023- Isolated pulmonary embolus in a subsegmental pulmonary artery to the posterior right upper lobe.  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 IMPRESSION: Colonoscopy reveals multiple areas of diverticulosis. Will treat for suspected diverticulitis. RTO if sxs worsen or do not improve. gwt 03/05/2012   Dyslipidemia 02/19/2016   Encounter for gynecological examination 12/07/2018   Essential hypertension 02/19/2016   High cholesterol    Hypercholesterolemia 06/23/2013   Formatting of this note might be different from the original. IMPRESSION: The goal is to have your total cholesterol < 200, the HDL (good cholesterol) >40, and the LDL (bad cholesterol) <100.  And triglycerides should be under 150.`E1o3L`It is recommended that you follow a good diet low in animal and dairy fat and exercise for at least 45 minutes a day 5-6 days a week. `E1o3L`*LDL still high at 14   Obesity 12/07/2018   Syncope and collapse 01/17/2022   Tobacco use disorder 02/19/2016   Type 2 diabetes mellitus with diabetic neuropathy, unspecified (HCC) 05/01/2020   Ventricular tachycardia (HCC) 02/21/2022    SURGICAL HISTORY: Past Surgical History:  Procedure Laterality Date   CESAREAN SECTION     COLONOSCOPY  2014   normal    SOCIAL HISTORY: Social History   Socioeconomic History   Marital status: Married    Spouse name: Not on file   Number of children: Not on file   Years of education: Not on file   Highest education level: 12th grade  Occupational History   Not on file  Tobacco Use   Smoking status: Every Day    Current packs/day: 0.00    Types: Cigarettes    Last attempt to quit: 09/05/2021    Years since quitting: 2.1   Smokeless tobacco:  Never   Tobacco comments:    11/13/23: Has not had a cigarette in a month.  Vaping Use   Vaping status: Never Used  Substance and Sexual Activity   Alcohol use: Yes    Comment: 1-2 times per week   Drug use: Never   Sexual activity: Yes    Birth control/protection: Post-menopausal  Other Topics Concern   Not on file  Social History Narrative   ** Merged History Encounter **       Right  handed   Social Drivers of Health   Financial Resource Strain: Low Risk  (08/29/2023)   Overall Financial Resource Strain (CARDIA)    Difficulty of Paying Living Expenses: Not hard at all  Food Insecurity: No Food Insecurity (08/29/2023)   Hunger Vital Sign    Worried About Running Out of Food in the Last Year: Never true    Ran Out of Food in the Last Year: Never true  Transportation Needs: No Transportation Needs (08/29/2023)   PRAPARE - Administrator, Civil Service (Medical): No    Lack of Transportation (Non-Medical): No  Physical Activity: Sufficiently Active (08/29/2023)   Exercise Vital Sign    Days of Exercise per Week: 3 days    Minutes of Exercise per Session: 50 min  Stress: Stress Concern Present (08/29/2023)   [employer] of Occupational Health - Occupational Stress Questionnaire    Feeling of Stress : To some extent  Social Connections: Socially Integrated (08/29/2023)   Social Connection and Isolation Panel [NHANES]    Frequency of Communication with Friends and Family: More than three times a week    Frequency of Social Gatherings with Friends and Family: Once a week    Attends Religious Services: More than 4 times per year    Active Member of [employer] or Organizations: Yes    Attends Engineer, structural: More than 4 times per year    Marital Status: Married  Catering manager Violence: Not on file    FAMILY HISTORY: Family History  Problem Relation Age of Onset   Stroke Maternal Grandmother    Colon cancer Neg Hx    Colon polyps Neg Hx    Esophageal cancer Neg Hx    Rectal cancer Neg Hx    Stomach cancer Neg Hx     ALLERGIES:  is allergic to lisinopril .  MEDICATIONS:  Current Outpatient Medications  Medication Sig Dispense Refill   amLODipine  (NORVASC ) 10 MG tablet Take 1 tablet (10 mg total) by mouth daily. 90 tablet 3   APIXABAN  (ELIQUIS ) VTE STARTER PACK (10MG  AND 5MG ) Take as directed on package: start with two-5mg  tablets twice  daily for 7 days. On day 8, switch to one-5mg  tablet twice daily. 74 each 0   aspirin  81 MG tablet Take 81 mg by mouth daily.     atorvastatin  (LIPITOR) 40 MG tablet Take 1 tablet (40 mg total) by mouth daily. 90 tablet 3   Cholecalciferol (VITAMIN D3) 1.25 MG (50000 UT) CAPS Take 1 weekly for 12 weeks 12 capsule 0   hydrochlorothiazide  (HYDRODIURIL ) 12.5 MG tablet Take 1 tablet (12.5 mg total) by mouth daily. 90 tablet 3   metFORMIN  (GLUCOPHAGE ) 500 MG tablet Take 1 tablet (500 mg total) by mouth daily. 90 tablet 3   omeprazole  (PRILOSEC) 40 MG capsule Take 1 capsule (40 mg total) by mouth daily. 90 capsule 3   ondansetron  (ZOFRAN -ODT) 4 MG disintegrating tablet Take 1 tablet (4 mg total) by mouth every 6 (six)  hours as needed for nausea or vomiting. 20 tablet 0   cyclobenzaprine  (FLEXERIL ) 5 MG tablet Take 2 tablets (10 mg total) by mouth at bedtime. (Patient not taking: Reported on 11/13/2023) 30 tablet 0   FLUoxetine  (PROZAC ) 20 MG capsule Take 1 capsule (20 mg total) by mouth daily. (Patient not taking: Reported on 11/13/2023) 90 capsule 3   No current facility-administered medications for this visit.    REVIEW OF SYSTEMS:   Constitutional: ( - ) fevers, ( - )  chills , ( - ) night sweats Eyes: ( - ) blurriness of vision, ( - ) double vision, ( - ) watery eyes Ears, nose, mouth, throat, and face: ( - ) mucositis, ( - ) sore throat Respiratory: ( - ) cough, ( - ) dyspnea, ( - ) wheezes Cardiovascular: ( - ) palpitation, ( - ) chest discomfort, ( - ) lower extremity swelling Gastrointestinal:  ( - ) nausea, ( - ) heartburn, ( - ) change in bowel habits Skin: ( - ) abnormal skin rashes Lymphatics: ( - ) new lymphadenopathy, ( - ) easy bruising Neurological: ( - ) numbness, ( - ) tingling, ( - ) new weaknesses Behavioral/Psych: ( - ) mood change, ( - ) new changes  All other systems were reviewed with the patient and are negative.  PHYSICAL EXAMINATION: ECOG PERFORMANCE STATUS: 1 -  Symptomatic but completely ambulatory  Vitals:   11/13/23 1311  BP: 105/63  Pulse: 99  Resp: 18  Temp: 98.2 F (36.8 C)  SpO2: 100%   Filed Weights   11/13/23 1311  Weight: 152 lb (68.9 kg)    GENERAL: well appearing female in NAD  SKIN: skin color, texture, turgor are normal, no rashes or significant lesions EYES: conjunctiva are pink and non-injected, sclera clear OROPHARYNX: no exudate, no erythema; lips, buccal mucosa, and tongue normal but with moderate pallor  NECK: supple, non-tender LYMPH:  no palpable lymphadenopathy in the cervical, axillary or supraclavicular lymph nodes.  LUNGS: clear to auscultation and percussion with normal breathing effort HEART: regular rate & rhythm and no murmurs and no lower extremity edema ABDOMEN: soft, non-tender, non-distended, normal bowel sounds Musculoskeletal: no cyanosis of digits and no clubbing  PSYCH: alert & oriented x 3, fluent speech NEURO: no focal motor/sensory deficits  LABORATORY DATA:  Pending   ASSESSMENT & PLAN CLAUDA ROE is a 63 y.o. African American female who was referred to us  for recent Pulmonary embolism. She is currently on Eliquis  therapy. She has tolerated this well with mild bleeding from a superficial scratch of her labia.   Review of her labs over the past few days show a Hgb that had trended down from 15.3 (10 days ago) to 11 (2 days ago). I want to verify with a CBC today that her trends have started to recover. I will also check nutritional labs including iron, B12, folate to help maximize her recovery from the blood loss secondary to her labial bleeding.   Together with her husband we discussed that a provoked venous thromboembolism (VTE) is one that has a clear inciting factor or event. Provoking factors include prolonged travel/immobility, surgery (particular abdominal or orthropedic), trauma,  and pregnancy/ estrogen containing birth control. This patient was reported to have immobility and smoking  history, which would qualify as a transient provoking factor. As such we would recommend 6-12 months of anticoagulation therapy with consideration of additional therapy if symptoms persist. The anticoagulation therapy of choice in this situation is Eliquis . The  patient currently has a supply of this medication and is able to afford it without difficult.   #Provoked DVT/Pulmonary Embolism --findings at this time are consistent with a provoked VTE --Reviewed baseline CMP and CBC to assure labs are adequate for DOAC therapy --recommend the patient continue eliquis  5mg  BID x 6-12 months --patient denies any bleeding, bruising, or dark stools on this medication. It is well tolerated. No difficulties accessing/affording the medication --Discussed hypercoagulable work up prior to discontinuation of Eliquis . We will try to do this prior to her insurance turnover as we discussed cost.  -- reviewed red flag signs and symptoms -- close follow up given her history and blood counts.   RTC 2 weeks APP, labs (CBC)  All questions were answered. The patient knows to call the clinic with any problems, questions or concerns.  I have spent a total of 55 minutes minutes of face-to-face and non-face-to-face time, preparing to see the patient, obtaining and/or reviewing separately obtained history, performing a medically appropriate examination, counseling and educating the patient, ordering medications/tests/procedures, referring and communicating with other health care professionals, documenting clinical information in the electronic health record, independently interpreting results and communicating results to the patient, and care coordination.    Sunnie England PA-C Department of Hematology/Oncology [facility]

## 2023-11-14 LAB — IRON AND IRON BINDING CAPACITY (CC-WL,HP ONLY)
Iron: 81 ug/dL (ref 28–170)
Saturation Ratios: 25 % (ref 10.4–31.8)
TIBC: 321 ug/dL (ref 250–450)
UIBC: 240 ug/dL (ref 148–442)

## 2023-11-17 ENCOUNTER — Encounter: Payer: Self-pay | Admitting: Medical Oncology

## 2023-11-21 ENCOUNTER — Emergency Department (HOSPITAL_BASED_OUTPATIENT_CLINIC_OR_DEPARTMENT_OTHER)

## 2023-11-21 ENCOUNTER — Encounter (HOSPITAL_BASED_OUTPATIENT_CLINIC_OR_DEPARTMENT_OTHER): Payer: Self-pay | Admitting: Emergency Medicine

## 2023-11-21 ENCOUNTER — Emergency Department (HOSPITAL_BASED_OUTPATIENT_CLINIC_OR_DEPARTMENT_OTHER)
Admission: EM | Admit: 2023-11-21 | Discharge: 2023-11-21 | Disposition: A | Discharge status: Home / Self Care | Attending: Emergency Medicine | Admitting: Emergency Medicine

## 2023-11-21 ENCOUNTER — Other Ambulatory Visit: Payer: Self-pay

## 2023-11-21 DIAGNOSIS — Z7984 Long term (current) use of oral hypoglycemic drugs: Secondary | ICD-10-CM | POA: Diagnosis not present

## 2023-11-21 DIAGNOSIS — R42 Dizziness and giddiness: Secondary | ICD-10-CM | POA: Diagnosis present

## 2023-11-21 DIAGNOSIS — R0602 Shortness of breath: Secondary | ICD-10-CM | POA: Diagnosis not present

## 2023-11-21 DIAGNOSIS — Z7982 Long term (current) use of aspirin: Secondary | ICD-10-CM | POA: Diagnosis not present

## 2023-11-21 DIAGNOSIS — E860 Dehydration: Secondary | ICD-10-CM | POA: Diagnosis not present

## 2023-11-21 DIAGNOSIS — Z79899 Other long term (current) drug therapy: Secondary | ICD-10-CM | POA: Diagnosis not present

## 2023-11-21 DIAGNOSIS — Z7901 Long term (current) use of anticoagulants: Secondary | ICD-10-CM | POA: Insufficient documentation

## 2023-11-21 DIAGNOSIS — E119 Type 2 diabetes mellitus without complications: Secondary | ICD-10-CM | POA: Insufficient documentation

## 2023-11-21 DIAGNOSIS — I10 Essential (primary) hypertension: Secondary | ICD-10-CM | POA: Insufficient documentation

## 2023-11-21 LAB — URINALYSIS, ROUTINE W REFLEX MICROSCOPIC
Bilirubin Urine: NEGATIVE
Glucose, UA: NEGATIVE mg/dL
Hgb urine dipstick: NEGATIVE
Ketones, ur: NEGATIVE mg/dL
Leukocytes,Ua: NEGATIVE
Nitrite: NEGATIVE
Protein, ur: NEGATIVE mg/dL
Specific Gravity, Urine: 1.02 (ref 1.005–1.030)
pH: 5.5 (ref 5.0–8.0)

## 2023-11-21 LAB — COMPREHENSIVE METABOLIC PANEL WITH GFR
ALT: 12 U/L (ref 0–44)
AST: 26 U/L (ref 15–41)
Albumin: 4.5 g/dL (ref 3.5–5.0)
Alkaline Phosphatase: 99 U/L (ref 38–126)
Anion gap: 14 (ref 5–15)
BUN: 19 mg/dL (ref 8–23)
CO2: 27 mmol/L (ref 22–32)
Calcium: 10.4 mg/dL — ABNORMAL HIGH (ref 8.9–10.3)
Chloride: 101 mmol/L (ref 98–111)
Creatinine, Ser: 1.03 mg/dL — ABNORMAL HIGH (ref 0.44–1.00)
GFR, Estimated: >60 mL/min (ref >60)
Glucose, Bld: 94 mg/dL (ref 70–99)
Potassium: 3.6 mmol/L (ref 3.5–5.1)
Sodium: 142 mmol/L (ref 135–145)
Total Bilirubin: 0.3 mg/dL (ref 0.0–1.2)
Total Protein: 7.4 g/dL (ref 6.5–8.1)

## 2023-11-21 LAB — CBC WITH DIFFERENTIAL/PLATELET
Abs Immature Granulocytes: 0.03 10*3/uL (ref 0.00–0.07)
Basophils Absolute: 0 10*3/uL (ref 0.0–0.1)
Basophils Relative: 1 %
Eosinophils Absolute: 0.1 10*3/uL (ref 0.0–0.5)
Eosinophils Relative: 1 %
HCT: 39.8 % (ref 36.0–46.0)
Hemoglobin: 13 g/dL (ref 12.0–15.0)
Immature Granulocytes: 0 %
Lymphocytes Relative: 45 %
Lymphs Abs: 3.7 10*3/uL (ref 0.7–4.0)
MCH: 30.9 pg (ref 26.0–34.0)
MCHC: 32.7 g/dL (ref 30.0–36.0)
MCV: 94.5 fL (ref 80.0–100.0)
Monocytes Absolute: 0.6 10*3/uL (ref 0.1–1.0)
Monocytes Relative: 7 %
Neutro Abs: 3.9 10*3/uL (ref 1.7–7.7)
Neutrophils Relative %: 46 %
Platelets: 319 10*3/uL (ref 150–400)
RBC: 4.21 MIL/uL (ref 3.87–5.11)
RDW: 13.3 % (ref 11.5–15.5)
WBC: 8.4 10*3/uL (ref 4.0–10.5)
nRBC: 0 % (ref 0.0–0.2)

## 2023-11-21 LAB — SARS CORONAVIRUS 2 BY RT PCR: SARS Coronavirus 2 by RT PCR: NEGATIVE

## 2023-11-21 LAB — CBG MONITORING, ED: Glucose-Capillary: 175 mg/dL — ABNORMAL HIGH (ref 70–99)

## 2023-11-21 LAB — TROPONIN T, HIGH SENSITIVITY: Troponin T High Sensitivity: <15 ng/L (ref <19)

## 2023-11-21 LAB — MAGNESIUM: Magnesium: 2 mg/dL (ref 1.7–2.4)

## 2023-11-21 MED ORDER — SODIUM CHLORIDE 0.9 % IV BOLUS
1000.0000 mL | Freq: Once | INTRAVENOUS | Status: AC
  Administered 2023-11-21: 1000 mL via INTRAVENOUS

## 2023-11-21 NOTE — ED Provider Notes (Signed)
 Diagnosed with PE at end of April, had been on eliquis . Has been having lightheadedness and muscle cramps since yesterday. Improved with fluids. Follow up on DVT study.  Physical Exam  BP 138/77   Pulse 81   Temp 98.3 F (36.8 C) (Oral)   Resp 17   Ht 5\' 7"  (1.702 m)   Wt 67.1 kg   SpO2 97%   BMI 23.18 kg/m   Physical Exam Vitals and nursing note reviewed.  Constitutional:      Appearance: Normal appearance.  HENT:     Head: Atraumatic.     Mouth/Throat:     Comments: Mildly dry mucous membranes Eyes:     Extraocular Movements: Extraocular movements intact.     Pupils: Pupils are equal, round, and reactive to light.  Cardiovascular:     Rate and Rhythm: Normal rate and regular rhythm.     Comments: Dorsalis pedis pulse 2+ bilaterally Pulmonary:     Effort: Pulmonary effort is normal.  Musculoskeletal:     Comments: Ambulates without difficulty No lower extremity edema present on exam bilaterally, no calf tenderness to palpation bilaterally  Neurological:     General: No focal deficit present.     Mental Status: She is alert.     Comments: Intact sensation of the bilateral upper and lower extremities 5/5 strength of the bilateral upper and lower extremities  Psychiatric:        Mood and Affect: Mood normal.        Behavior: Behavior normal.     Procedures  Procedures  ED Course / MDM    Medical Decision Making Amount and/or Complexity of Data Reviewed Labs: ordered. Radiology: ordered.   Patient received at signout.  In short, patient is presenting with feeling generally tired, lightheaded, and having some muscle cramps in her legs that started yesterday.  Denying any chest pain, shortness of breath, lower extremity pain or swelling.  Patient's workup is reassuring for her lightheadedness.  Patient was maintained on cardiac monitor which showed normal sinus rhythm, no ST changes noted.  Troponin under 15, patient denying any chest pain or shortness of breath.   No concern for arrhythmia at this time or ACS at this time.  On exam, no neurologic deficits, able to ambulate without difficulty.  Low concern for TIA or CVA.  Urinalysis without signs of UTI.  CBC unremarkable, no signs of infection or anemia.  CMP without any significant electrolyte abnormalities.  Magnesium  within normal limits.  CMP did reveal a slightly elevated creatinine 1.03.  She was given 1 L NS bolus by previous team, and reports her dizziness has improved after receiving fluids.  Suspect she may have been slightly dehydrated causing her lightheaded sensation.  Patient reported concern that the symptoms are similar to the symptoms she had when she first was diagnosed with her PE.  Patient reports compliance with Eliquis , states she has not missed any doses.  She did have a recent CTA chest 10 days ago on 11/11/2023 which was negative for PE.  DVT study of the bilateral lower extremities today is negative for DVT.  She is denying any chest pain or shortness of breath, very low concern for new PE at this time.  Do not feel we need to obtain repeat CTA chest today.  Patient does report she was diagnosed with low B12 by her PCP and started on supplementation.  Discussed that this could also be causing some of her symptoms of tiredness and muscle cramping.  Recommended patient follow-up with her PCP within the next week for recheck of symptoms and further management.  Return precautions given         Tesean Stump, PA-C 11/21/23 1804    Carin Charleston, MD 11/21/23 [phone]

## 2023-11-21 NOTE — ED Provider Notes (Signed)
 I saw and evaluated the patient, reviewed the resident's note and I agree with the findings and plan.  EKG Interpretation Date/Time:  Friday Nov 21 2023 11:00:04 EDT Ventricular Rate:  103 PR Interval:  138 QRS Duration:  80 QT Interval:  306 QTC Calculation: 400 R Axis:   -29  Text Interpretation: Sinus tachycardia Moderate voltage criteria for LVH, may be normal variant ( R in aVL , Cornell product ) Possible Lateral infarct , age undetermined Abnormal ECG When compared with ECG of 11-Nov-2023 15:10, PREVIOUS ECG IS PRESENT no sig change from previous Confirmed by Wynetta Heckle [phone]) on 11/21/2023 2:58:33 PM   Patient reports she is getting dizzy with standing and leg cramps.  She is taking Eliquis  twice daily as prescribed for diagnosis of pulmonary embolus.   Alert with clear mental status.  No respiratory distress.  Lungs are clear to auscultation.  No rub murmur gallop.  Patient PE study [phone] negative for PE PE study 4\28\25 interpreted as positive for isolated subsegmental pulmonary bolus on the right 5\6/25 PE study negative for PE.  Patient has had 2 CT angiograms within the last 3 weeks.  At this time we will proceed with DVT study ultrasound.  I did not see ultrasounds to see if there is clot present in the legs.  If lower extremity ultrasounds are negative, low suspicion for any recurrent substantial clot.  Patient reports compliance with Xarelto.  May be other etiology for subjective dizziness.  Patient's heart rate did go up with standing.  Will hydrate with a liter normal saline.      Wynetta Heckle, MD 11/21/23 [phone]

## 2023-11-21 NOTE — ED Provider Notes (Signed)
 [facility] Provider Note   CSN: [id number] Arrival date & time: 11/21/23  1033     History  Chief Complaint  Patient presents with   Dizziness    Margaret Sims is a 63 y.o. female.  Margaret Sims is a 45 F with a history of HTN, HLD, T2DM, and recently diagnosed PE on Eliquis  who presents for dizziness, nausea, and leg cramps since last night. She states symptoms are similar to those she had when she was first diagnosed with PE, which was thought to be due to tobacco use/immobility). She was reevaluated 2 weeks ago for similar symptoms and CT angio was repeated, which was negative for PE. It was thought that patient's symptoms were due to new Losartan  that was started for blood pressure. Patient followed up with PCP and Losartan  was discontinued. Has been having decreased appetite since she was first diagnosed with PE in April but was feeling well overall until last night. Is very concerned she has developed a new clot. Denies any chest pain, abdominal pain, fever, or bleeding. Does endorse some shortness of breath but does not have a way of checking oxygen at home.    Dizziness      Home Medications Prior to Admission medications   Medication Sig Start Date End Date Taking? Authorizing Provider  amLODipine  (NORVASC ) 10 MG tablet Take 1 tablet (10 mg total) by mouth daily. 10/06/23   Copland, Skipper Dumas, MD  APIXABAN  (ELIQUIS ) VTE STARTER PACK (10MG  AND 5MG ) Take as directed on package: start with two-5mg  tablets twice daily for 7 days. On day 8, switch to one-5mg  tablet twice daily. 11/03/23   Mozell Arias, MD  aspirin  81 MG tablet Take 81 mg by mouth daily.    [provider]  atorvastatin  (LIPITOR) 40 MG tablet Take 1 tablet (40 mg total) by mouth daily. 03/03/23   Copland, Jessica C, MD  Cholecalciferol (VITAMIN D3) 1.25 MG (50000 UT) CAPS Take 1 weekly for 12 weeks 09/04/23   Copland, Skipper Dumas, MD  cyclobenzaprine   (FLEXERIL ) 5 MG tablet Take 2 tablets (10 mg total) by mouth at bedtime. Patient not taking: Reported on 11/13/2023 10/02/23   Bronson Canny, PA-C  FLUoxetine  (PROZAC ) 20 MG capsule Take 1 capsule (20 mg total) by mouth daily. Patient not taking: Reported on 11/13/2023 09/24/23   Copland, Skipper Dumas, MD  hydrochlorothiazide  (HYDRODIURIL ) 12.5 MG tablet Take 1 tablet (12.5 mg total) by mouth daily. 10/27/23   Copland, Skipper Dumas, MD  metFORMIN  (GLUCOPHAGE ) 500 MG tablet Take 1 tablet (500 mg total) by mouth daily. 03/03/23   Copland, Skipper Dumas, MD  omeprazole  (PRILOSEC) 40 MG capsule Take 1 capsule (40 mg total) by mouth daily. 03/03/23   Copland, Skipper Dumas, MD  ondansetron  (ZOFRAN -ODT) 4 MG disintegrating tablet Take 1 tablet (4 mg total) by mouth every 6 (six) hours as needed for nausea or vomiting. 11/04/23   Prosperi, Christian H, PA-C      Allergies    Lisinopril     Review of Systems   Review of Systems  Neurological:  Positive for dizziness.    Physical Exam Updated Vital Signs BP (!) 136/93   Pulse 73   Temp 98.3 F (36.8 C) (Oral)   Resp 12   Ht 5\' 7"  (1.702 m)   Wt 67.1 kg   SpO2 99%   BMI 23.18 kg/m  Physical Exam HENT:     Head: Normocephalic and atraumatic.  Cardiovascular:  Rate and Rhythm: Normal rate and regular rhythm.     Heart sounds: Normal heart sounds.  Pulmonary:     Effort: Pulmonary effort is normal.     Breath sounds: Normal breath sounds.  Abdominal:     General: Bowel sounds are normal.     Palpations: Abdomen is soft.  Musculoskeletal:     Comments: Left lower extremity without swelling or erythema, tenderness to palpation back of leg. Pedal pulses, sensation, ROM, and strength WNL.   Skin:    General: Skin is warm.  Neurological:     General: No focal deficit present.     Mental Status: She is alert.  Psychiatric:        Mood and Affect: Mood normal.     ED Results / Procedures / Treatments   Labs (all labs ordered are listed, but only  abnormal results are displayed) Labs Reviewed  COMPREHENSIVE METABOLIC PANEL WITH GFR - Abnormal; Notable for the following components:      Result Value   Creatinine, Ser 1.03 (*)    Calcium  10.4 (*)    All other components within normal limits  CBG MONITORING, ED - Abnormal; Notable for the following components:   Glucose-Capillary 175 (*)    All other components within normal limits  SARS CORONAVIRUS 2 BY RT PCR  CBC WITH DIFFERENTIAL/PLATELET  MAGNESIUM   URINALYSIS, ROUTINE W REFLEX MICROSCOPIC  TROPONIN T, HIGH SENSITIVITY    EKG EKG Interpretation Date/Time:  Friday Nov 21 2023 11:00:04 EDT Ventricular Rate:  103 PR Interval:  138 QRS Duration:  80 QT Interval:  306 QTC Calculation: 400 R Axis:   -29  Text Interpretation: Sinus tachycardia Moderate voltage criteria for LVH, may be normal variant ( R in aVL , Cornell product ) Possible Lateral infarct , age undetermined Abnormal ECG When compared with ECG of 11-Nov-2023 15:10, PREVIOUS ECG IS PRESENT no sig change from previous Confirmed by Wynetta Heckle [phone]) on 11/21/2023 2:58:33 PM  Radiology US  Venous Img Lower Bilateral (DVT) Result Date: 11/21/2023 CLINICAL DATA:  86310 Swelling 60454, bilateral calf cramping and swelling for 2 days. On Eliquis , history recent pulmonary emboli EXAM: BILATERAL LOWER EXTREMITY VENOUS DOPPLER ULTRASOUND TECHNIQUE: Gray-scale sonography with graded compression, as well as color Doppler and duplex ultrasound were performed to evaluate the lower extremity deep venous systems from the level of the common femoral vein and including the common femoral, femoral, profunda femoral, popliteal and calf veins including the posterior tibial, peroneal and gastrocnemius veins when visible. The superficial great saphenous vein was also interrogated. Spectral Doppler was utilized to evaluate flow at rest and with distal augmentation maneuvers in the common femoral, femoral and popliteal veins. COMPARISON:   None Available. FINDINGS: RIGHT LOWER EXTREMITY Common Femoral Vein: No evidence of thrombus. Normal compressibility, respiratory phasicity and response to augmentation. Saphenofemoral Junction: No evidence of thrombus. Normal compressibility and flow on color Doppler imaging. Profunda Femoral Vein: No evidence of thrombus. Normal compressibility and flow on color Doppler imaging. Femoral Vein: No evidence of thrombus. Normal compressibility, respiratory phasicity and response to augmentation. Popliteal Vein: No evidence of thrombus. Normal compressibility, respiratory phasicity and response to augmentation. Calf Veins: No evidence of thrombus. Normal compressibility and flow on color Doppler imaging. Superficial Great Saphenous Vein: No evidence of thrombus. Normal compressibility. LEFT LOWER EXTREMITY Common Femoral Vein: No evidence of thrombus. Normal compressibility, respiratory phasicity and response to augmentation. Saphenofemoral Junction: No evidence of thrombus. Normal compressibility and flow on color Doppler imaging. Profunda Femoral Vein: No  evidence of thrombus. Normal compressibility and flow on color Doppler imaging. Femoral Vein: No evidence of thrombus. Normal compressibility, respiratory phasicity and response to augmentation. Popliteal Vein: No evidence of thrombus. Normal compressibility, respiratory phasicity and response to augmentation. Calf Veins: No evidence of thrombus. Normal compressibility and flow on color Doppler imaging. Superficial Great Saphenous Vein: No evidence of thrombus. Normal compressibility. IMPRESSION: No evidence of deep venous thrombosis in either lower extremity. Electronically Signed   By: Melven Stable.  Shick M.D.   On: 11/21/2023 16:39   DG Chest 2 View Result Date: 11/21/2023 CLINICAL DATA:  Dizziness.  Leg cramping. EXAM: CHEST - 2 VIEW COMPARISON:  Chest radiograph dated 11/04/2023. FINDINGS: The heart size and mediastinal contours are within normal limits. Both lungs  are clear. The visualized skeletal structures are unremarkable. IMPRESSION: No active cardiopulmonary disease. Electronically Signed   By: Angus Bark M.D.   On: 11/21/2023 11:29    Procedures Procedures    Medications Ordered in ED Medications  sodium chloride  0.9 % bolus 1,000 mL (1,000 mLs Intravenous New Bag/Given 11/21/23 1525)    ED Course/ Medical Decision Making/ A&P                                 Medical Decision Making Patient presented with dizziness, leg cramps, some improved nausea since last night. Vitals here have been stable with the exception of her blood pressure which has been intermittently elevated. EKG, troponins, CBC, Mg, UA, and Covid swab obtained. EKG with mild sinus tachycardia HR 103. Troponin <15, magnesium  normal, CBC unremarkable, CMP stable compared to previous. UA pending. Physical exam reassuring, did have tenderness on back of left leg, otherwise unremarkable. Given risk factors and history ordered ultrasound dopplers of left leg to help rule out DVT. Patient was also given IV bolus of fluids to help with dizziness that may be due to low intake in recent days. Discussed low suspicion for repeat PE, as patient had recent CT angiogram that was negative and she has not had missed doses of her Eliquis . Discussed case with Dr. Charlee Conine and signed out to Rexie Catena, PA for continued management.   Amount and/or Complexity of Data Reviewed Labs: ordered. Radiology: ordered.          Final Clinical Impression(s) / ED Diagnoses Final diagnoses:  None    Rx / DC Orders ED Discharge Orders     None         Arellano Zameza, Rutger Salton, MD 11/21/23 1643    Wynetta Heckle, MD 11/26/23 1517

## 2023-11-21 NOTE — Discharge Instructions (Addendum)
 Your workup is reassuring today.    Your blood counts and electrolytes were normal today.    Your creatinine which is a measure of your kidney function is slightly elevated.  This is likely because you are dehydrated.  You are given fluids here to help with this.  Please have your PCP continue to monitor your creatinine.  Your urine did not show any signs of infection.  The ultrasound of your legs did not show any blood clots.  Your chest x-ray was normal today.  Your heart enzyme (troponin) and EKG (heart's rhythm) was normal today.   Your COVID test was negative  Please follow-up with your PCP within the next week for recheck of symptoms.  Please return to the ER if you develop any chest pain, shortness of breath, unilateral weakness, slurred speech, any other new or concerning symptoms.

## 2023-11-21 NOTE — ED Notes (Signed)
 D/c paperwork reviewed with pt, including follow up care.  All questions and/or concerns addressed at time of d/c.  No further needs expressed. . Pt verbalized understanding, Ambulatory without assistance to ED exit, NAD.

## 2023-11-21 NOTE — ED Notes (Signed)
Pt ambulatory to bathroom, steady gait

## 2023-11-21 NOTE — ED Triage Notes (Signed)
 Dizziness and nausea since last night along with leg cramps.  Pt states she was seen 2 weeks ago for same thing and was diagnosed with PE.  Pt on eliquis .  Pt A&O x 4.  No neuro deficits noted.

## 2023-11-27 ENCOUNTER — Ambulatory Visit: Payer: Self-pay | Admitting: Medical Oncology

## 2023-11-27 ENCOUNTER — Encounter: Payer: Self-pay | Admitting: Medical Oncology

## 2023-11-27 ENCOUNTER — Inpatient Hospital Stay

## 2023-11-27 ENCOUNTER — Inpatient Hospital Stay (HOSPITAL_BASED_OUTPATIENT_CLINIC_OR_DEPARTMENT_OTHER): Admitting: Medical Oncology

## 2023-11-27 VITALS — BP 114/86 | HR 80 | Temp 98.0°F | Resp 18 | Ht 67.0 in | Wt 151.1 lb

## 2023-11-27 DIAGNOSIS — Z7901 Long term (current) use of anticoagulants: Secondary | ICD-10-CM

## 2023-11-27 DIAGNOSIS — D649 Anemia, unspecified: Secondary | ICD-10-CM

## 2023-11-27 DIAGNOSIS — I2693 Single subsegmental pulmonary embolism without acute cor pulmonale: Secondary | ICD-10-CM

## 2023-11-27 DIAGNOSIS — Z5181 Encounter for therapeutic drug level monitoring: Secondary | ICD-10-CM | POA: Diagnosis not present

## 2023-11-27 LAB — CBC
HCT: 36.4 % (ref 36.0–46.0)
Hemoglobin: 11.9 g/dL — ABNORMAL LOW (ref 12.0–15.0)
MCH: 31.2 pg (ref 26.0–34.0)
MCHC: 32.7 g/dL (ref 30.0–36.0)
MCV: 95.5 fL (ref 80.0–100.0)
Platelets: 278 10*3/uL (ref 150–400)
RBC: 3.81 MIL/uL — ABNORMAL LOW (ref 3.87–5.11)
RDW: 13.3 % (ref 11.5–15.5)
WBC: 8.2 10*3/uL (ref 4.0–10.5)
nRBC: 0 % (ref 0.0–0.2)

## 2023-11-27 MED ORDER — APIXABAN 5 MG PO TABS: 5.0000 mg | ORAL_TABLET | Freq: Two times a day (BID) | ORAL | 6 refills | Status: DC

## 2023-11-27 NOTE — Progress Notes (Signed)
 Hematology and Oncology Follow Up Visit  Margaret Sims [id number] 08-27-1960 63 y.o. 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 IMPRESSION: Colonoscopy reveals multiple areas of diverticulosis. Will treat for suspected diverticulitis. RTO if sxs worsen or do not improve. gwt 03/05/2012   Dyslipidemia 02/19/2016   Encounter for gynecological examination 12/07/2018   Essential hypertension 02/19/2016   High cholesterol    Hypercholesterolemia 06/23/2013   Formatting of this note might be different from the original. IMPRESSION: The goal is to have your total cholesterol < 200, the HDL (good cholesterol) >40, and the LDL (bad cholesterol) <100.  And triglycerides should be under 150.`E1o3L`It is recommended that you follow a good diet low in animal and dairy fat and exercise for at least 45 minutes a day 5-6 days a week. `E1o3L`*LDL still high at 14   Obesity 12/07/2018   Syncope and collapse 01/17/2022   Tobacco use disorder 02/19/2016   Type 2 diabetes mellitus with diabetic neuropathy, unspecified (HCC) 05/01/2020   Ventricular tachycardia (HCC) 02/21/2022    Principle Diagnosis:  Single Substantial Pulmonary Embolism without acute cor pulmonale  Current Therapy:   Eliquis  5 mg PO daily    Interim History:  Margaret Sims is back for follow-up for her PE and anticoagulation management. She is here with her husband.   Other than continued symptoms of generalized fatigue and dizziness she has been doing well.   Tolerating Eliquis  well. No side effects that she knows of. No falls.   No abdominal pains, chest pains, SOB, peripheral edema, hemoptysis.   There has been no bleeding to her knowledge: denies epistaxis, gingivitis, hemoptysis, hematemesis, hematuria, melena, excessive bruising, blood donation.    Appetite is returning slowly.  No night sweats.   Wt Readings from Last 3 Encounters:  11/27/23 151 lb 1.3 oz (68.5 kg)  11/21/23 148 lb (67.1 kg)  11/13/23 152 lb (68.9 kg)     Medications:   Current Outpatient Medications:    amLODipine  (NORVASC ) 10 MG tablet, Take 1 tablet (10 mg total) by mouth daily., Disp: 90 tablet, Rfl: 3   apixaban  (ELIQUIS ) 5 MG TABS tablet, Take 1 tablet (5 mg total) by mouth 2 (two) times daily., Disp: 60 tablet, Rfl: 6   APIXABAN  (ELIQUIS ) VTE STARTER PACK (10MG  AND 5MG ), Take as directed on package: start with two-5mg  tablets twice daily for 7 days. On day 8, switch to one-5mg  tablet twice daily., Disp: 74 each, Rfl: 0   aspirin  81 MG tablet, Take 81 mg by mouth daily., Disp: , Rfl:    atorvastatin  (LIPITOR) 40 MG tablet, Take 1 tablet (40 mg total) by mouth daily., Disp: 90 tablet, Rfl: 3   Cholecalciferol (VITAMIN D3) 1.25 MG (50000 UT) CAPS, Take 1 weekly for 12 weeks, Disp: 12 capsule, Rfl: 0   hydrochlorothiazide  (HYDRODIURIL ) 12.5 MG tablet, Take 1 tablet (12.5 mg total) by mouth daily., Disp: 90 tablet, Rfl: 3   metFORMIN  (GLUCOPHAGE ) 500 MG tablet, Take 1 tablet (500 mg total) by mouth daily., Disp: 90 tablet, Rfl: 3   omeprazole  (PRILOSEC) 40 MG capsule, Take 1 capsule (40 mg total) by mouth daily., Disp: 90 capsule, Rfl: 3   cyclobenzaprine  (FLEXERIL ) 5 MG tablet, Take 2 tablets (10 mg total) by mouth at bedtime. (Patient not taking: Reported on 11/06/2023), Disp: 30 tablet, Rfl: 0  FLUoxetine  (PROZAC ) 20 MG capsule, Take 1 capsule (20 mg total) by mouth daily. (Patient not taking: Reported on 11/06/2023), Disp: 90 capsule, Rfl: 3   ondansetron  (ZOFRAN -ODT) 4 MG disintegrating tablet, Take 1 tablet (4 mg total) by mouth every 6 (six) hours as needed for nausea or vomiting. (Patient not taking: Reported on 11/27/2023), Disp: 20 tablet, Rfl: 0  Allergies:  Allergies  Allergen Reactions   Lisinopril  Swelling    Angioedema noted 09/2023. Left side of  tongue.    Past Medical History, Surgical history, Social history, and Family History were reviewed and updated.  Review of Systems: Review of Systems  Constitutional:  Positive for fatigue. Negative for appetite change, chills and unexpected weight change.  HENT:  Negative.    Eyes: Negative.   Respiratory: Negative.    Cardiovascular: Negative.   Gastrointestinal: Negative.   Endocrine: Negative.   Genitourinary: Negative.    Musculoskeletal: Negative.   Skin: Negative.   Neurological:  Positive for dizziness. Negative for extremity weakness, headaches, light-headedness and numbness.  Hematological:  Negative for adenopathy.  Psychiatric/Behavioral: Negative.       Physical Exam:  height is 5\' 7"  (1.702 m) and weight is 151 lb 1.3 oz (68.5 kg). Her oral temperature is 98 F (36.7 C). Her blood pressure is 114/86 and her pulse is 80. Her respiration is 18 and oxygen saturation is 98%.   Physical Exam General: NAD Cardiovascular: regular rate and rhythm Pulmonary: clear ant fields Abdomen: soft, nontender, + bowel sounds GU: no suprapubic tenderness Extremities: no edema, no joint deformities Skin: no rashes Neurological: Weakness but otherwise nonfocal   Lab Results  Component Value Date   WBC 8.2 11/27/2023   HGB 11.9 (L) 11/27/2023   HCT 36.4 11/27/2023   MCV 95.5 11/27/2023   PLT 278 11/27/2023     Chemistry      Component Value Date/Time   NA 142 11/21/2023 1056   NA 141 09/08/2015 0000   K 3.6 11/21/2023 1056   CL 101 11/21/2023 1056   CO2 27 11/21/2023 1056   BUN 19 11/21/2023 1056   BUN 10 09/08/2015 0000   CREATININE 1.03 (H) 11/21/2023 1056   CREATININE 1.06 (H) 11/13/2023 1401   CREATININE 0.91 05/01/2020 1020   GLU 96 09/08/2015 0000      Component Value Date/Time   CALCIUM  10.4 (H) 11/21/2023 1056   ALKPHOS 99 11/21/2023 1056   AST 26 11/21/2023 1056   AST 20 11/13/2023 1401   ALT 12 11/21/2023 1056   ALT 12 11/13/2023 1401   BILITOT  0.3 11/21/2023 1056   BILITOT 0.3 11/13/2023 1401     Encounter Diagnoses  Name Primary?   Anemia, unspecified type Yes   Single subsegmental pulmonary embolism without acute cor pulmonale [facility])    Anticoagulation management encounter     Assessment and Plan- Patient is a 63 y.o. African American female who we follow for anticoagulation management following a single history of PE. She is on Eliquis  therapy and doing well.   Her Hgb is essentially stable from when we saw her 2 weeks ago. No blood needed tomorrow.  Given lower B12 and folate levels I have suggested a prenatal vitamin once daily.  I will recheck nutritional labs relating to anemia at follow up in 1 month.  Refilled her Elquis for her to continue at current dose.     Disposition: Cancel appointments for tomorrow RTC 1 month APP, labs    Sunnie England PA-C 5/22/202510:36 AM

## 2023-11-28 ENCOUNTER — Encounter

## 2023-12-31 ENCOUNTER — Other Ambulatory Visit: Payer: Self-pay | Admitting: Medical Oncology

## 2023-12-31 DIAGNOSIS — D649 Anemia, unspecified: Secondary | ICD-10-CM

## 2023-12-31 DIAGNOSIS — Z5181 Encounter for therapeutic drug level monitoring: Secondary | ICD-10-CM

## 2024-01-01 ENCOUNTER — Inpatient Hospital Stay: Attending: Medical Oncology

## 2024-01-01 ENCOUNTER — Encounter: Payer: Self-pay | Admitting: Medical Oncology

## 2024-01-01 ENCOUNTER — Other Ambulatory Visit: Payer: Self-pay | Admitting: *Deleted

## 2024-01-01 ENCOUNTER — Telehealth: Payer: Self-pay | Admitting: *Deleted

## 2024-01-01 ENCOUNTER — Inpatient Hospital Stay (HOSPITAL_BASED_OUTPATIENT_CLINIC_OR_DEPARTMENT_OTHER): Admitting: Medical Oncology

## 2024-01-01 ENCOUNTER — Other Ambulatory Visit (HOSPITAL_BASED_OUTPATIENT_CLINIC_OR_DEPARTMENT_OTHER): Payer: Self-pay

## 2024-01-01 VITALS — BP 109/77 | HR 89 | Temp 98.4°F | Resp 18 | Ht 67.0 in | Wt 151.0 lb

## 2024-01-01 DIAGNOSIS — Z5181 Encounter for therapeutic drug level monitoring: Secondary | ICD-10-CM

## 2024-01-01 DIAGNOSIS — Z7901 Long term (current) use of anticoagulants: Secondary | ICD-10-CM | POA: Insufficient documentation

## 2024-01-01 DIAGNOSIS — I2693 Single subsegmental pulmonary embolism without acute cor pulmonale: Secondary | ICD-10-CM

## 2024-01-01 DIAGNOSIS — D649 Anemia, unspecified: Secondary | ICD-10-CM | POA: Insufficient documentation

## 2024-01-01 LAB — CMP (CANCER CENTER ONLY)
ALT: 17 U/L (ref 0–44)
AST: 26 U/L (ref 15–41)
Albumin: 4.5 g/dL (ref 3.5–5.0)
Alkaline Phosphatase: 81 U/L (ref 38–126)
Anion gap: 9 (ref 5–15)
BUN: 14 mg/dL (ref 8–23)
CO2: 28 mmol/L (ref 22–32)
Calcium: 10 mg/dL (ref 8.9–10.3)
Chloride: 105 mmol/L (ref 98–111)
Creatinine: 1.11 mg/dL — ABNORMAL HIGH (ref 0.44–1.00)
GFR, Estimated: 56 mL/min — ABNORMAL LOW (ref >60)
Glucose, Bld: 115 mg/dL — ABNORMAL HIGH (ref 70–99)
Potassium: 4.1 mmol/L (ref 3.5–5.1)
Sodium: 142 mmol/L (ref 135–145)
Total Bilirubin: 0.6 mg/dL (ref 0.0–1.2)
Total Protein: 7 g/dL (ref 6.5–8.1)

## 2024-01-01 LAB — IRON AND IRON BINDING CAPACITY (CC-WL,HP ONLY)
Iron: 65 ug/dL (ref 28–170)
Saturation Ratios: 18 % (ref 10.4–31.8)
TIBC: 358 ug/dL (ref 250–450)
UIBC: 293 ug/dL (ref 148–442)

## 2024-01-01 LAB — CBC WITH DIFFERENTIAL (CANCER CENTER ONLY)
Abs Immature Granulocytes: 0.02 10*3/uL (ref 0.00–0.07)
Basophils Absolute: 0.1 10*3/uL (ref 0.0–0.1)
Basophils Relative: 1 %
Eosinophils Absolute: 0.1 10*3/uL (ref 0.0–0.5)
Eosinophils Relative: 1 %
HCT: 37.8 % (ref 36.0–46.0)
Hemoglobin: 12.1 g/dL (ref 12.0–15.0)
Immature Granulocytes: 0 %
Lymphocytes Relative: 55 %
Lymphs Abs: 3.9 10*3/uL (ref 0.7–4.0)
MCH: 30.6 pg (ref 26.0–34.0)
MCHC: 32 g/dL (ref 30.0–36.0)
MCV: 95.7 fL (ref 80.0–100.0)
Monocytes Absolute: 0.5 10*3/uL (ref 0.1–1.0)
Monocytes Relative: 6 %
Neutro Abs: 2.6 10*3/uL (ref 1.7–7.7)
Neutrophils Relative %: 37 %
Platelet Count: 331 10*3/uL (ref 150–400)
RBC: 3.95 MIL/uL (ref 3.87–5.11)
RDW: 13.4 % (ref 11.5–15.5)
WBC Count: 7.2 10*3/uL (ref 4.0–10.5)
nRBC: 0 % (ref 0.0–0.2)

## 2024-01-01 LAB — FERRITIN: Ferritin: 30 ng/mL (ref 11–307)

## 2024-01-01 LAB — RETIC PANEL
Immature Retic Fract: 22.4 % — ABNORMAL HIGH (ref 2.3–15.9)
RBC.: 3.93 MIL/uL (ref 3.87–5.11)
Retic Count, Absolute: 110.4 10*3/uL (ref 19.0–186.0)
Retic Ct Pct: 2.8 % (ref 0.4–3.1)
Reticulocyte Hemoglobin: 34.6 pg (ref >27.9)

## 2024-01-01 LAB — VITAMIN B12: Vitamin B-12: 1398 pg/mL — ABNORMAL HIGH (ref 180–914)

## 2024-01-01 MED ORDER — APIXABAN 5 MG PO TABS: 5.0000 mg | ORAL_TABLET | Freq: Two times a day (BID) | ORAL | 6 refills | Status: DC

## 2024-01-01 MED ORDER — APIXABAN 5 MG PO TABS: 5.0000 mg | ORAL_TABLET | Freq: Two times a day (BID) | ORAL | 0 refills | Status: DC

## 2024-01-01 NOTE — Progress Notes (Signed)
 Hematology and Oncology Follow Up Visit  Margaret Sims [id number] 09-20-1960 63 y.o. 01/01/2024  Past Medical History:  Diagnosis Date   Acid reflux    Adhesive capsulitis of left shoulder 12/07/2018   Diverticulitis of colon 03/05/2012   Formatting of this note might be different from the original. IMPRESSION: Colonoscopy reveals multiple areas of diverticulosis. Will treat for suspected diverticulitis. RTO if sxs worsen or do not improve. gwt 03/05/2012   Dyslipidemia 02/19/2016   Encounter for gynecological examination 12/07/2018   Essential hypertension 02/19/2016   High cholesterol    Hypercholesterolemia 06/23/2013   Formatting of this note might be different from the original. IMPRESSION: The goal is to have your total cholesterol < 200, the HDL (good cholesterol) >40, and the LDL (bad cholesterol) <100.  And triglycerides should be under 150.`E1o3L`It is recommended that you follow a good diet low in animal and dairy fat and exercise for at least 45 minutes a day 5-6 days a week. 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 Ilg is back for follow-up for her PE and anticoagulation management. She is here with her husband.   She states that she has been ok since her last visit. No change in symptoms. She had a repeat CTA on 11/11/2023 which showed no evidence of PE.   Tolerating Eliquis  well. No side effects that she knows of. No falls.   No abdominal pains, chest pains, SOB, peripheral edema, hemoptysis.   There has been no bleeding to her knowledge: denies epistaxis,  gingivitis, hemoptysis, hematemesis, hematuria, melena, excessive bruising, blood donation.   Appetite is returning slowly.  No night sweats.   Wt Readings from Last 3 Encounters:  01/01/24 151 lb 0.6 oz (68.5 kg)  11/27/23 151 lb 1.3 oz (68.5 kg)  11/21/23 148 lb (67.1 kg)     Medications:   Current Outpatient Medications:    amLODipine  (NORVASC ) 10 MG tablet, Take 1 tablet (10 mg total) by mouth daily., Disp: 90 tablet, Rfl: 3   aspirin  81 MG tablet, Take 81 mg by mouth daily., Disp: , Rfl:    atorvastatin  (LIPITOR) 40 MG tablet, Take 1 tablet (40 mg total) by mouth daily., Disp: 90 tablet, Rfl: 3   Cholecalciferol (VITAMIN D3) 1.25 MG (50000 UT) CAPS, Take 1 weekly for 12 weeks, Disp: 12 capsule, Rfl: 0   hydrochlorothiazide  (HYDRODIURIL ) 12.5 MG tablet, Take 1 tablet (12.5 mg total) by mouth daily., Disp: 90 tablet, Rfl: 3   metFORMIN  (GLUCOPHAGE ) 500 MG tablet, Take 1 tablet (500 mg total) by mouth daily., Disp: 90 tablet, Rfl: 3   omeprazole  (PRILOSEC) 40 MG capsule, Take 1 capsule (40 mg total) by mouth daily., Disp: 90 capsule, Rfl: 3   ondansetron  (ZOFRAN -ODT) 4 MG disintegrating tablet, Take 1 tablet (4 mg total) by mouth every 6 (six) hours as needed for nausea or vomiting., Disp: 20 tablet, Rfl: 0   apixaban  (ELIQUIS ) 5 MG TABS tablet, Take 1 tablet (5 mg total) by mouth 2 (two) times daily., Disp: 180 tablet, Rfl: 6   cyclobenzaprine  (FLEXERIL ) 5 MG tablet, Take 2 tablets (10 mg total) by  mouth at bedtime. (Patient not taking: Reported on 01/01/2024), Disp: 30 tablet, Rfl: 0   FLUoxetine  (PROZAC ) 20 MG capsule, Take 1 capsule (20 mg total) by mouth daily. (Patient not taking: Reported on 01/01/2024), Disp: 90 capsule, Rfl: 3  Allergies:  Allergies  Allergen Reactions   Lisinopril  Swelling    Angioedema noted 09/2023. Left side of tongue.    Past Medical History, Surgical history, Social history, and Family History were reviewed and updated.  Review of Systems: Review of  Systems  Constitutional:  Positive for fatigue. Negative for appetite change, chills and unexpected weight change.  HENT:  Negative.    Eyes: Negative.   Respiratory: Negative.    Cardiovascular: Negative.   Gastrointestinal: Negative.   Endocrine: Negative.   Genitourinary: Negative.    Musculoskeletal: Negative.   Skin: Negative.   Neurological:  Positive for dizziness. Negative for extremity weakness, headaches, light-headedness and numbness.  Hematological:  Negative for adenopathy.  Psychiatric/Behavioral: Negative.       Physical Exam:  height is 5' 7 (1.702 m) and weight is 151 lb 0.6 oz (68.5 kg). Her oral temperature is 98.4 F (36.9 C). Her blood pressure is 109/77 and her pulse is 89. Her respiration is 18 and oxygen saturation is 98%.   Physical Exam General: NAD Cardiovascular: regular rate and rhythm Pulmonary: clear ant fields Abdomen: soft, nontender, + bowel sounds GU: no suprapubic tenderness Extremities: no edema, no joint deformities Skin: no rashes Neurological: Weakness but otherwise nonfocal   Lab Results  Component Value Date   WBC 7.2 01/01/2024   HGB 12.1 01/01/2024   HCT 37.8 01/01/2024   MCV 95.7 01/01/2024   PLT 331 01/01/2024     Chemistry      Component Value Date/Time   NA 142 01/01/2024 1055   NA 141 09/08/2015 0000   K 4.1 01/01/2024 1055   CL 105 01/01/2024 1055   CO2 28 01/01/2024 1055   BUN 14 01/01/2024 1055   BUN 10 09/08/2015 0000   CREATININE 1.11 (H) 01/01/2024 1055   CREATININE 0.91 05/01/2020 1020   GLU 96 09/08/2015 0000      Component Value Date/Time   CALCIUM  10.0 01/01/2024 1055   ALKPHOS 81 01/01/2024 1055   AST 26 01/01/2024 1055   ALT 17 01/01/2024 1055   BILITOT 0.6 01/01/2024 1055     Encounter Diagnoses  Name Primary?   Anemia, unspecified type Yes   Anticoagulation management encounter    Single subsegmental pulmonary embolism without acute cor pulmonale (HCC)     Assessment and Plan- Patient  is a 63 y.o. African American female who we follow for anticoagulation management following a single history of PE. She is on Eliquis  therapy and doing well. Plan is to   Her Hgb has returned to normal with a value of 12.1. She has no know bleeding Continue B12 and prenatal vitamin  Refilled her Eliquis   Given that it is suspected that her PE was provoked, we have discussed treatment with Eliquis  x 12 months. At her follow up in 6 months we will obtain hypercoagulable labs to assess for any additional causes for repeat clotting events. As she is no longer smoking and will avoid long car rides without immobility, if hypercoag work up is negative she would prefer to be titrated off of the Eliquis  onto a full strength asa x 6 months then down to two 81 mg asa x 6 months then 81 mg asa indefinitely.    Disposition: RTC 6 months  APP, labs (CBC, CMP, iron, ferritin, B12, folate, hypercoag work up)   Lauraine Dais PA-C 6/26/202511:56 AM

## 2024-01-01 NOTE — Telephone Encounter (Signed)
 Patient in today for labs and see Lauraine [facility]. Patient stated,I need a refill on the Eliquis  if Lauraine wants me to stay on it. Per Lauraine, she will need to be on it. I sent a refill to [facility] for Eliquis  5 mg po bid, #60. Mail order prescription sent to [facility] for #180 tablets. Patient aware of both prescriptions.

## 2024-01-02 ENCOUNTER — Other Ambulatory Visit: Payer: Self-pay | Admitting: *Deleted

## 2024-01-02 ENCOUNTER — Other Ambulatory Visit (HOSPITAL_BASED_OUTPATIENT_CLINIC_OR_DEPARTMENT_OTHER): Payer: Self-pay

## 2024-01-02 DIAGNOSIS — Z5181 Encounter for therapeutic drug level monitoring: Secondary | ICD-10-CM

## 2024-01-02 DIAGNOSIS — I2693 Single subsegmental pulmonary embolism without acute cor pulmonale: Secondary | ICD-10-CM

## 2024-01-02 MED ORDER — APIXABAN 5 MG PO TABS
5.0000 mg | ORAL_TABLET | Freq: Two times a day (BID) | ORAL | 0 refills | Status: AC
  Filled 2024-01-02: qty 60, 30d supply, fill #0

## 2024-01-05 ENCOUNTER — Encounter: Payer: Self-pay | Admitting: Family Medicine

## 2024-01-05 ENCOUNTER — Ambulatory Visit: Payer: Self-pay | Admitting: Medical Oncology

## 2024-01-17 NOTE — Progress Notes (Unsigned)
 [facility] [address] [place], [place] [address] [phone]  Date:  01/21/2024   Name:  Margaret Sims   DOB:  04-22-1961   MRN:  [id number]  PCP:  Watt Harlene BROCKS, MD    Chief Complaint: No chief complaint on file.   History of Present Illness:  Margaret Sims is a 63 y.o. very pleasant female patient who presents with the following:  Patient seen today for follow-up.  She has been seen at hematology recently with anemia and history of pulmonary embolism, most recent visit with Lauraine Dais was on June 26 at which point she was doing well, plan to anticoag with Eliquis  for 12 months  I saw her most recently on May 1 after she had just been diagnosed with a pulmonary embolism in the ER on April 28. History of well-controlled diabetes, hypertension, dyslipidemia, elevated coronary calcium     Lab Results  Component Value Date   HGBA1C 6.6 (H) 09/03/2023   Amlodipine  10 Eliquis  5 twice daily Aspirin  81 Lipitor Hydrochlorothiazide  12.5 Metformin  Omeprazole    Patient Active Problem List   Diagnosis Date Noted   COVID-19 01/27/2023   Abdominal bruit 11/12/2022   Coronary artery disease 11/12/2022   Acid reflux 11/01/2022   High cholesterol 11/01/2022   Ventricular tachycardia (HCC) 02/21/2022   Syncope and collapse 01/17/2022   Type 2 diabetes mellitus with diabetic neuropathy, unspecified (HCC) 05/01/2020   Adhesive capsulitis of left shoulder 12/07/2018   Encounter for gynecological examination 12/07/2018   Obesity 12/07/2018   Essential hypertension 02/19/2016   Dyslipidemia 02/19/2016   Tobacco use disorder 02/19/2016   Hypercholesterolemia 06/23/2013   Diverticulitis of colon 03/05/2012    Past Medical History:  Diagnosis Date   Acid reflux    Adhesive capsulitis of left shoulder 12/07/2018   Diverticulitis of colon 03/05/2012   Formatting of this note might be different from the  original. IMPRESSION: Colonoscopy reveals multiple areas of diverticulosis. Will treat for suspected diverticulitis. RTO if sxs worsen or do not improve. gwt 03/05/2012   Dyslipidemia 02/19/2016   Encounter for gynecological examination 12/07/2018   Essential hypertension 02/19/2016   High cholesterol    Hypercholesterolemia 06/23/2013   Formatting of this note might be different from the original. IMPRESSION: The goal is to have your total cholesterol < 200, the HDL (good cholesterol) >40, and the LDL (bad cholesterol) <100.  And triglycerides should be under 150.`E1o3L`It is recommended that you follow a good diet low in animal and dairy fat and exercise for at least 45 minutes a day 5-6 days a week. `E1o3L`*LDL still high at 14   Obesity 12/07/2018   Syncope and collapse 01/17/2022   Tobacco use disorder 02/19/2016   Type 2 diabetes mellitus with diabetic neuropathy, unspecified (HCC) 05/01/2020   Ventricular tachycardia (HCC) 02/21/2022    Past Surgical History:  Procedure Laterality Date   CESAREAN SECTION     COLONOSCOPY  2014   normal    Social History   Tobacco Use   Smoking status: Every Day    Current packs/day: 0.00    Types: Cigarettes    Last attempt to quit: 09/05/2021    Years since quitting: 2.3   Smokeless tobacco: Never   Tobacco comments:    11/13/23: Has not had a cigarette in a month.  Vaping Use   Vaping status: Never Used  Substance Use Topics   Alcohol use: Yes    Comment:  1-2 times per week   Drug use: Never    Family History  Problem Relation Age of Onset   Stroke Maternal Grandmother    Colon cancer Neg Hx    Colon polyps Neg Hx    Esophageal cancer Neg Hx    Rectal cancer Neg Hx    Stomach cancer Neg Hx     Allergies  Allergen Reactions   Lisinopril  Swelling    Angioedema noted 09/2023. Left side of tongue.    Medication list has been reviewed and updated.  Current Outpatient Medications on File Prior to Visit  Medication Sig Dispense  Refill   amLODipine  (NORVASC ) 10 MG tablet Take 1 tablet (10 mg total) by mouth daily. 90 tablet 3   apixaban  (ELIQUIS ) 5 MG TABS tablet Take 1 tablet (5 mg total) by mouth 2 (two) times daily. 180 tablet 6   apixaban  (ELIQUIS ) 5 MG TABS tablet Take 1 tablet (5 mg total) by mouth 2 (two) times daily. 60 tablet 0   aspirin  81 MG tablet Take 81 mg by mouth daily.     atorvastatin  (LIPITOR) 40 MG tablet Take 1 tablet (40 mg total) by mouth daily. 90 tablet 3   Cholecalciferol (VITAMIN D3) 1.25 MG (50000 UT) CAPS Take 1 weekly for 12 weeks 12 capsule 0   cyclobenzaprine  (FLEXERIL ) 5 MG tablet Take 2 tablets (10 mg total) by mouth at bedtime. (Patient not taking: Reported on 01/01/2024) 30 tablet 0   FLUoxetine  (PROZAC ) 20 MG capsule Take 1 capsule (20 mg total) by mouth daily. (Patient not taking: Reported on 01/01/2024) 90 capsule 3   hydrochlorothiazide  (HYDRODIURIL ) 12.5 MG tablet Take 1 tablet (12.5 mg total) by mouth daily. 90 tablet 3   metFORMIN  (GLUCOPHAGE ) 500 MG tablet Take 1 tablet (500 mg total) by mouth daily. 90 tablet 3   omeprazole  (PRILOSEC) 40 MG capsule Take 1 capsule (40 mg total) by mouth daily. 90 capsule 3   ondansetron  (ZOFRAN -ODT) 4 MG disintegrating tablet Take 1 tablet (4 mg total) by mouth every 6 (six) hours as needed for nausea or vomiting. 20 tablet 0   No current facility-administered medications on file prior to visit.    Review of Systems:  As per HPI- otherwise negative.   Physical Examination: There were no vitals filed for this visit. There were no vitals filed for this visit. There is no height or weight on file to calculate BMI. Ideal Body Weight:    GEN: no acute distress. HEENT: Atraumatic, Normocephalic.  Ears and Nose: No external deformity. CV: RRR, No M/G/R. No JVD. No thrill. No extra heart sounds. PULM: CTA B, no wheezes, crackles, rhonchi. No retractions. No resp. distress. No accessory muscle use. ABD: S, NT, ND, +BS. No rebound. No  HSM. EXTR: No c/c/e PSYCH: Normally interactive. Conversant.    Assessment and Plan: ***  Signed Harlene Schroeder, MD

## 2024-01-21 ENCOUNTER — Ambulatory Visit: Admitting: Family Medicine

## 2024-01-21 ENCOUNTER — Encounter: Payer: Self-pay | Admitting: Family Medicine

## 2024-01-21 ENCOUNTER — Ambulatory Visit (HOSPITAL_BASED_OUTPATIENT_CLINIC_OR_DEPARTMENT_OTHER)
Admission: RE | Admit: 2024-01-21 | Discharge: 2024-01-21 | Disposition: A | Discharge status: Home / Self Care | Source: Ambulatory Visit | Attending: Family Medicine | Admitting: Family Medicine

## 2024-01-21 VITALS — BP 112/62 | HR 87 | Ht 67.0 in | Wt 155.4 lb

## 2024-01-21 DIAGNOSIS — R0602 Shortness of breath: Secondary | ICD-10-CM | POA: Insufficient documentation

## 2024-01-21 LAB — TROPONIN I (HIGH SENSITIVITY): High Sens Troponin I: 11 ng/L (ref 2–17)

## 2024-01-21 NOTE — Patient Instructions (Signed)
 Please stop by the lab and then the ground floor imaging dept for a plain chest film. Please let me know if anything is changing or getting worse

## 2024-01-22 ENCOUNTER — Other Ambulatory Visit: Payer: Self-pay | Admitting: Family Medicine

## 2024-01-22 ENCOUNTER — Encounter: Payer: Self-pay | Admitting: Family Medicine

## 2024-01-22 ENCOUNTER — Encounter (HOSPITAL_BASED_OUTPATIENT_CLINIC_OR_DEPARTMENT_OTHER): Payer: Self-pay

## 2024-01-22 ENCOUNTER — Ambulatory Visit (HOSPITAL_BASED_OUTPATIENT_CLINIC_OR_DEPARTMENT_OTHER)
Admission: RE | Admit: 2024-01-22 | Discharge: 2024-01-22 | Disposition: A | Discharge status: Home / Self Care | Source: Ambulatory Visit | Attending: Family Medicine | Admitting: Family Medicine

## 2024-01-22 DIAGNOSIS — R0602 Shortness of breath: Secondary | ICD-10-CM | POA: Insufficient documentation

## 2024-01-22 DIAGNOSIS — Z86711 Personal history of pulmonary embolism: Secondary | ICD-10-CM | POA: Insufficient documentation

## 2024-01-22 LAB — D-DIMER, QUANTITATIVE: D-Dimer, Quant: 0.25 ug{FEU}/mL (ref <0.50)

## 2024-01-22 MED ORDER — IOHEXOL 350 MG/ML SOLN
100.0000 mL | Freq: Once | INTRAVENOUS | Status: AC | PRN
  Administered 2024-01-22: 75 mL via INTRAVENOUS

## 2024-01-23 LAB — HM DIABETES EYE EXAM

## 2024-01-26 ENCOUNTER — Encounter: Payer: Self-pay | Admitting: Family Medicine

## 2024-02-29 NOTE — Patient Instructions (Incomplete)
 It was good to see you today, I will be in touch with your labs.  Recommend a flu shot and COVID booster this fall Pneumonia given today You can get a dose of RSV if you like For itching- try taking a daily OTC claritin or zyrtec.  Let me know if this is getting worse Colon due this fall Please see me in about 6 months

## 2024-02-29 NOTE — Progress Notes (Unsigned)
 [facility] [address], [address] [place], [place] [address] [phone]  Date:  03/03/2024   Name:  Margaret Sims   DOB:  10-24-1960   MRN:  [id number]  PCP:  Watt Harlene BROCKS, MD    Chief Complaint: No chief complaint on file.   History of Present Illness:  Margaret Sims is a 63 y.o. very pleasant female patient who presents with the following:  Patient seen today for periodic follow-up-I last saw Evany in July when she was feeling short of breath She was diagnosed with a pulmonary embolism in April of this year and is being treated with Eliquis , plan anticoagulation for 12 months In addition she has history of well-controlled diabetes, hypertension, dyslipidemia, elevated coronary calcium   Pulmonary embolism was seen on her CT angiogram 11/03/23. However when she had a repeat CT angiogram on May 6 no pulm embolism was appreciated  When I saw her in July D-dimer was normal.  Her hematologist Dr. Timmy did recommend doing a CT angiogram which I obtained, it was normal/negative for recurrent PE I recommended that she get in with her cardiologist concerning shortness of breath  Lab Results  Component Value Date   HGBA1C 6.6 (H) 09/03/2023   Can give pneumonia booster Shingrix  is complete Recommend flu shot, COVID booster this fall Colon cancer screening completed in November-She had 9 polyps, short-term follow-up recommended  Amlodipine  10 Eliquis  Atorvastatin  HCTZ Metformin  Omeprazole    Patient Active Problem List   Diagnosis Date Noted   COVID-19 01/27/2023   Abdominal bruit 11/12/2022   Coronary artery disease 11/12/2022   Acid reflux 11/01/2022   High cholesterol 11/01/2022   Ventricular tachycardia (HCC) 02/21/2022   Syncope and collapse 01/17/2022   Type 2 diabetes mellitus with diabetic neuropathy, unspecified (HCC) 05/01/2020   Adhesive capsulitis of left shoulder 12/07/2018   Encounter for  gynecological examination 12/07/2018   Obesity 12/07/2018   Essential hypertension 02/19/2016   Dyslipidemia 02/19/2016   Tobacco use disorder 02/19/2016   Hypercholesterolemia 06/23/2013   Diverticulitis of colon 03/05/2012    Past Medical History:  Diagnosis Date   Acid reflux    Adhesive capsulitis of left shoulder 12/07/2018   Diverticulitis of colon 03/05/2012   Formatting of this note might be different from the original. IMPRESSION: Colonoscopy reveals multiple areas of diverticulosis. Will treat for suspected diverticulitis. RTO if sxs worsen or do not improve. gwt 03/05/2012   Dyslipidemia 02/19/2016   Encounter for gynecological examination 12/07/2018   Essential hypertension 02/19/2016   High cholesterol    Hypercholesterolemia 06/23/2013   Formatting of this note might be different from the original. IMPRESSION: The goal is to have your total cholesterol < 200, the HDL (good cholesterol) >40, and the LDL (bad cholesterol) <100.  And triglycerides should be under 150.`E1o3L`It is recommended that you follow a good diet low in animal and dairy fat and exercise for at least 45 minutes a day 5-6 days a week. `E1o3L`*LDL still high at 14   Obesity 12/07/2018   Syncope and collapse 01/17/2022   Tobacco use disorder 02/19/2016   Type 2 diabetes mellitus with diabetic neuropathy, unspecified (HCC) 05/01/2020   Ventricular tachycardia (HCC) 02/21/2022    Past Surgical History:  Procedure Laterality Date   CESAREAN SECTION     COLONOSCOPY  2014   normal    Social History   Tobacco Use   Smoking status: Every Day    Current packs/day: 0.00  Types: Cigarettes    Last attempt to quit: 09/05/2021    Years since quitting: 2.4   Smokeless tobacco: Never   Tobacco comments:    11/13/23: Has not had a cigarette in a month.  Vaping Use   Vaping status: Never Used  Substance Use Topics   Alcohol use: Yes    Comment: 1-2 times per week   Drug use: Never    Family History   Problem Relation Age of Onset   Stroke Maternal Grandmother    Colon cancer Neg Hx    Colon polyps Neg Hx    Esophageal cancer Neg Hx    Rectal cancer Neg Hx    Stomach cancer Neg Hx     Allergies  Allergen Reactions   Lisinopril  Swelling    Angioedema noted 09/2023. Left side of tongue.    Medication list has been reviewed and updated.  Current Outpatient Medications on File Prior to Visit  Medication Sig Dispense Refill   amLODipine  (NORVASC ) 10 MG tablet Take 1 tablet (10 mg total) by mouth daily. 90 tablet 3   apixaban  (ELIQUIS ) 5 MG TABS tablet Take 1 tablet (5 mg total) by mouth 2 (two) times daily. 180 tablet 6   apixaban  (ELIQUIS ) 5 MG TABS tablet Take 1 tablet (5 mg total) by mouth 2 (two) times daily. 60 tablet 0   aspirin  81 MG tablet Take 81 mg by mouth daily.     atorvastatin  (LIPITOR) 40 MG tablet Take 1 tablet (40 mg total) by mouth daily. 90 tablet 3   Cholecalciferol (VITAMIN D3) 1.25 MG (50000 UT) CAPS Take 1 weekly for 12 weeks 12 capsule 0   cyclobenzaprine  (FLEXERIL ) 5 MG tablet Take 2 tablets (10 mg total) by mouth at bedtime. (Patient not taking: Reported on 01/21/2024) 30 tablet 0   FLUoxetine  (PROZAC ) 20 MG capsule Take 1 capsule (20 mg total) by mouth daily. (Patient not taking: Reported on 01/21/2024) 90 capsule 3   hydrochlorothiazide  (HYDRODIURIL ) 12.5 MG tablet Take 1 tablet (12.5 mg total) by mouth daily. 90 tablet 3   metFORMIN  (GLUCOPHAGE ) 500 MG tablet Take 1 tablet (500 mg total) by mouth daily. 90 tablet 3   omeprazole  (PRILOSEC) 40 MG capsule Take 1 capsule (40 mg total) by mouth daily. 90 capsule 3   ondansetron  (ZOFRAN -ODT) 4 MG disintegrating tablet Take 1 tablet (4 mg total) by mouth every 6 (six) hours as needed for nausea or vomiting. 20 tablet 0   No current facility-administered medications on file prior to visit.    Review of Systems:  As per HPI- otherwise negative.   Physical Examination: There were no vitals filed for this  visit. There were no vitals filed for this visit. There is no height or weight on file to calculate BMI. Ideal Body Weight:    GEN: no acute distress. HEENT: Atraumatic, Normocephalic.  Ears and Nose: No external deformity. CV: RRR, No M/G/R. No JVD. No thrill. No extra heart sounds. PULM: CTA B, no wheezes, crackles, rhonchi. No retractions. No resp. distress. No accessory muscle use. ABD: S, NT, ND, +BS. No rebound. No HSM. EXTR: No c/c/e PSYCH: Normally interactive. Conversant.    Assessment and Plan: ***  Signed Harlene Schroeder, MD

## 2024-03-03 ENCOUNTER — Ambulatory Visit: Admitting: Family Medicine

## 2024-03-03 ENCOUNTER — Encounter: Payer: Self-pay | Admitting: Family Medicine

## 2024-03-03 VITALS — BP 122/66 | HR 66 | Temp 97.8°F | Ht 67.0 in | Wt 157.0 lb

## 2024-03-03 DIAGNOSIS — F17200 Nicotine dependence, unspecified, uncomplicated: Secondary | ICD-10-CM | POA: Diagnosis not present

## 2024-03-03 DIAGNOSIS — E7800 Pure hypercholesterolemia, unspecified: Secondary | ICD-10-CM

## 2024-03-03 DIAGNOSIS — I10 Essential (primary) hypertension: Secondary | ICD-10-CM

## 2024-03-03 DIAGNOSIS — Z23 Encounter for immunization: Secondary | ICD-10-CM | POA: Diagnosis not present

## 2024-03-03 DIAGNOSIS — E1140 Type 2 diabetes mellitus with diabetic neuropathy, unspecified: Secondary | ICD-10-CM

## 2024-03-04 ENCOUNTER — Encounter: Payer: Self-pay | Admitting: Family Medicine

## 2024-03-04 DIAGNOSIS — E7800 Pure hypercholesterolemia, unspecified: Secondary | ICD-10-CM

## 2024-03-04 LAB — LIPID PANEL
Cholesterol: 194 mg/dL (ref 0–200)
HDL: 53.1 mg/dL (ref >39.00)
LDL Cholesterol: 110 mg/dL — ABNORMAL HIGH (ref 0–99)
NonHDL: 141.19
Total CHOL/HDL Ratio: 4
Triglycerides: 156 mg/dL — ABNORMAL HIGH (ref 0.0–149.0)
VLDL: 31.2 mg/dL (ref 0.0–40.0)

## 2024-03-04 LAB — HEMOGLOBIN A1C: Hgb A1c MFr Bld: 6.7 % — ABNORMAL HIGH (ref 4.6–6.5)

## 2024-03-09 MED ORDER — ROSUVASTATIN CALCIUM 20 MG PO TABS: 20.0000 mg | ORAL_TABLET | Freq: Every day | ORAL | 3 refills | Status: DC

## 2024-03-15 ENCOUNTER — Other Ambulatory Visit: Payer: Self-pay | Admitting: Family Medicine

## 2024-03-15 DIAGNOSIS — E119 Type 2 diabetes mellitus without complications: Secondary | ICD-10-CM

## 2024-03-18 ENCOUNTER — Other Ambulatory Visit: Payer: Self-pay | Admitting: Family Medicine

## 2024-03-18 DIAGNOSIS — K219 Gastro-esophageal reflux disease without esophagitis: Secondary | ICD-10-CM

## 2024-04-05 ENCOUNTER — Ambulatory Visit: Payer: Self-pay

## 2024-04-05 NOTE — Telephone Encounter (Signed)
 FYI Only or Action Required?: FYI only for provider.  Patient was last seen in primary care on 03/03/2024 by Copland, Harlene BROCKS, MD.  Called Nurse Triage reporting Shoulder Pain and Groin Pain.  Symptoms began several months ago.  Interventions attempted: OTC medications: Tylenol .  Symptoms are: right groin pain, bilateral upper arm/shoulder pain, right leg weakness feels like it's gonna give out sometimes when walking on it gradually worsening.  Triage Disposition: See PCP When Office is Open (Within 3 Days) (overriding See HCP Within 4 Hours (Or PCP Triage))  Patient/caregiver understands and will follow disposition?: Yes          Copied from CRM #[id number]. Topic: Clinical - Red Word Triage >> Apr 05, 2024  8:47 AM Olam RAMAN wrote: Red Word that prompted transfer to Nurse Triage: pain in upper inner thigh and arm and shoulder Reason for Disposition  [1] SEVERE pain (e.g., excruciating, unable to do any normal activities) AND [2] not improved after 2 hours of pain medicine  Answer Assessment - Initial Assessment Questions Patient declined sooner acute visit with providers, states she prefers to wait til Wednesday to see her PCP. Advised to call back or go straight to ED if she has any chest pain, jaw pain or SOB. Patient states she is still taking her Eliquis .  1. ONSET: When did the pain start?      Earlier this year, several months. Worsening x 2-3 weeks.  2. LOCATION: Where is the pain located?      Right groin, inner upper thigh.  3. PAIN: How bad is the pain?    (Scale 1-10; or mild, moderate, severe)     8/10. Treating with Tylenol . Worse with movement or turning too quick.  4. WORK OR EXERCISE: Has there been any recent work or exercise that involved this part of the body?      She states she does work out at [employer] and does walking but can't think of any certain injury.  5. CAUSE: What do you think is causing the leg pain?     She is concerned due to she  is on a blood thinner and has a history of blood clots. She is unsure what else would be causing it.  6. OTHER SYMPTOMS: Do you have any other symptoms? (e.g., chest pain, back pain, breathing difficulty, swelling, rash, fever, numbness, weakness)     Both upper arms/shoulders pain (worse in the right arm) x several months (since March). Denies chest pain, difficulty breathing, rash, swelling, numbness. She states there is some weakness in the right leg, she describes as feeling light it will give out on her.  7. PREGNANCY: Is there any chance you are pregnant? When was your last menstrual period?     N/A.  Protocols used: Leg Pain-A-AH

## 2024-04-06 NOTE — Progress Notes (Unsigned)
 [facility] [address], [address] [place], [place] [address] [phone]  Date:  04/07/2024   Name:  Margaret Sims   DOB:  Jan 29, 1961   MRN:  [id number]  PCP:  Watt Harlene BROCKS, MD    Chief Complaint: Shoulder Pain (Pain in both shoulders I had a shot in it before and nothing is helping /) and Groin Pain (R sided Groin pain I've had it before in the past and was given a muscle relaxer and the pain subsided. I am now on a blood thinner and didn't know if I should take the muscle relaxer. Onset  a couple of weeks ago)   History of Present Illness:  Margaret Sims is a 63 y.o. very pleasant female patient who presents with the following:  Patient seen today with concern of shoulder pain and right thigh pain.  I saw her most recently in late August She was diagnosed with a pulmonary embolism in April 2025 and is being treated with Eliquis , plan anticoagulation for 12 months. In addition she has history of well-controlled diabetes, hypertension, dyslipidemia, elevated coronary calcium   Pulmonary embolism was seen on her CT angiogram 11/03/23.However when she had a repeat CT angiogram on May 6 no pulm embolism was appreciated.  I also did a repeat CT in July 2025 due to shortness of breath, no pulm embolism She called the nurse triage line earlier this week with concern of several months of bilateral shoulder pain as well as groin pain. I also did recommend that she see cardiology regarding shortness of breath which we discussed over the summer.  She is an established patient of Dr. Edwyna  -Flu shot, do today  -Recommend COVID booster this fall -Her colonoscopy is coming up, due this year  Amlodipine  10 Eliquis  5 twice daily Aspirin  HCTZ 12.5 Metformin  500 daily Omeprazole  Crestor  20  Lab Results  Component Value Date   HGBA1C 6.7 (H) 03/03/2024     Discussed the use of AI scribe software for clinical note  transcription with the patient, who gave verbal consent to proceed.  History of Present Illness Margaret Sims is a 63 year old female who presents with right groin pain.  She has been experiencing severe shooting pain in her right groin area for the past three weeks, sometimes feeling as if her leg might give out when she takes a step. No issues with eating, bowel movements, or urination. She recalls similar pain about a year ago, which was alleviated by muscle relaxers we think Flexeril   She received shoulder injections in March, which did not alleviate her shoulder pain.  Previous shoulder injections were more helpful.  Her right shoulder is painful, especially at night, with pain extending upwards. She has not had an MRI of her shoulder. She previously used Flexeril  for muscle spasms, which was helpful, but has not taken it recently due to label warnings. No current back pain, although she has experienced lower back pain in the past.  She speculates that attending the gym and using machines might have contributed to her groin pain.  However, she denies any specific injury.  No tenderness in her lower leg or issues when stretching her leg out straight, but notes discomfort when sitting with her leg up.   Patient Active Problem List   Diagnosis Date Noted   COVID-19 01/27/2023   Abdominal bruit 11/12/2022   Coronary artery disease 11/12/2022   Acid reflux 11/01/2022  High cholesterol 11/01/2022   Ventricular tachycardia (HCC) 02/21/2022   Syncope and collapse 01/17/2022   Type 2 diabetes mellitus with diabetic neuropathy, unspecified (HCC) 05/01/2020   Adhesive capsulitis of left shoulder 12/07/2018   Encounter for gynecological examination 12/07/2018   Obesity 12/07/2018   Essential hypertension 02/19/2016   Dyslipidemia 02/19/2016   Tobacco use disorder 02/19/2016   Hypercholesterolemia 06/23/2013   Diverticulitis of colon 03/05/2012    Past Medical History:  Diagnosis Date    Acid reflux    Adhesive capsulitis of left shoulder 12/07/2018   Diverticulitis of colon 03/05/2012   Formatting of this note might be different from the original. IMPRESSION: Colonoscopy reveals multiple areas of diverticulosis. Will treat for suspected diverticulitis. RTO if sxs worsen or do not improve. gwt 03/05/2012   Dyslipidemia 02/19/2016   Encounter for gynecological examination 12/07/2018   Essential hypertension 02/19/2016   High cholesterol    Hypercholesterolemia 06/23/2013   Formatting of this note might be different from the original. IMPRESSION: The goal is to have your total cholesterol < 200, the HDL (good cholesterol) >40, and the LDL (bad cholesterol) <100.  And triglycerides should be under 150.`E1o3L`It is recommended that you follow a good diet low in animal and dairy fat and exercise for at least 45 minutes a day 5-6 days a week. `E1o3L`*LDL still high at 14   Obesity 12/07/2018   Syncope and collapse 01/17/2022   Tobacco use disorder 02/19/2016   Type 2 diabetes mellitus with diabetic neuropathy, unspecified (HCC) 05/01/2020   Ventricular tachycardia (HCC) 02/21/2022    Past Surgical History:  Procedure Laterality Date   CESAREAN SECTION     COLONOSCOPY  2014   normal    Social History   Tobacco Use   Smoking status: Every Day    Current packs/day: 0.00    Types: Cigarettes    Last attempt to quit: 09/05/2021    Years since quitting: 2.5   Smokeless tobacco: Never   Tobacco comments:    11/13/23: Has not had a cigarette in a month.  Vaping Use   Vaping status: Never Used  Substance Use Topics   Alcohol use: Yes    Comment: 1-2 times per week   Drug use: Never    Family History  Problem Relation Age of Onset   Stroke Maternal Grandmother    Colon cancer Neg Hx    Colon polyps Neg Hx    Esophageal cancer Neg Hx    Rectal cancer Neg Hx    Stomach cancer Neg Hx     Allergies  Allergen Reactions   Lisinopril  Swelling    Angioedema noted 09/2023.  Left side of tongue.    Medication list has been reviewed and updated.  Current Outpatient Medications on File Prior to Visit  Medication Sig Dispense Refill   amLODipine  (NORVASC ) 10 MG tablet Take 1 tablet (10 mg total) by mouth daily. 90 tablet 3   apixaban  (ELIQUIS ) 5 MG TABS tablet Take 1 tablet (5 mg total) by mouth 2 (two) times daily. 180 tablet 6   apixaban  (ELIQUIS ) 5 MG TABS tablet Take 1 tablet (5 mg total) by mouth 2 (two) times daily. 60 tablet 0   Cholecalciferol (VITAMIN D3) 1.25 MG (50000 UT) CAPS Take 1 weekly for 12 weeks 12 capsule 0   hydrochlorothiazide  (HYDRODIURIL ) 12.5 MG tablet Take 1 tablet (12.5 mg total) by mouth daily. 90 tablet 3   metFORMIN  (GLUCOPHAGE ) 500 MG tablet TAKE 1 TABLET (500 MG TOTAL) BY MOUTH DAILY.  90 tablet 3   omeprazole  (PRILOSEC) 40 MG capsule Take 1 capsule (40 mg total) by mouth daily. 90 capsule 2   ondansetron  (ZOFRAN -ODT) 4 MG disintegrating tablet Take 1 tablet (4 mg total) by mouth every 6 (six) hours as needed for nausea or vomiting. 20 tablet 0   rosuvastatin  (CRESTOR ) 20 MG tablet Take 1 tablet (20 mg total) by mouth daily. 90 tablet 3   aspirin  81 MG tablet Take 81 mg by mouth daily. (Patient not taking: Reported on 04/07/2024)     No current facility-administered medications on file prior to visit.    Review of Systems:  As per HPI- otherwise negative.    Physical Examination: Vitals:   04/07/24 1352  BP: 116/84  Pulse: 98  SpO2: 100%   Vitals:   04/07/24 1352  Weight: 161 lb 6.4 oz (73.2 kg)  Height: 5' 7 (1.702 m)   Body mass index is 25.28 kg/m. Ideal Body Weight: Weight in (lb) to have BMI = 25: 159.3  GEN: no acute distress.  Normal weight, looks well HEENT: Atraumatic, Normocephalic.  Ears and Nose: No external deformity. CV: RRR, No M/G/R. No JVD. No thrill. No extra heart sounds. PULM: CTA B, no wheezes, crackles, rhonchi. No retractions. No resp. distress. No accessory muscle use. ABD: S, NT, ND,  +BS. No rebound. No HSM. EXTR: No c/c/e PSYCH: Normally interactive. Conversant.  Right shoulder exam is consistent with rotator cuff tendinitis.  She has discomfort with full flexion and abduction, pain and minor weakness with empty can testing.  The left shoulder is basically normal. Her right hip exam is normal, she has normal rotation and flexion of the hip.  Her pain seems in the adductor muscles of the right inner thigh.  There is no heat or redness, no swelling is apparent.  Assessment and Plan: Chronic right shoulder pain - Plan: Ambulatory referral to Orthopedic Surgery  Right thigh pain - Plan: DG FEMUR, MIN 2 VIEWS RIGHT, US  Venous Img Lower Unilateral Right, cyclobenzaprine  (FLEXERIL ) 10 MG tablet, CANCELED: DG Hip Unilat W OR W/O Pelvis 2-3 Views Right  Need for influenza vaccination - Plan: Flu vaccine trivalent PF, 6mos and older(Flulaval,Afluria,Fluarix,Fluzone)  Assessment & Plan Right groin muscle strain Right groin pain for three weeks, likely muscle strain. Differential includes potential blood clot due to varicosities. - Order ultrasound of the leg to rule out blood clot. - Advise heat therapy and gentle massage, might try lidocaine  patches - Recommend gentle stretching exercises. - Order x-ray to ensure bone integrity. Flexeril  prescription, be cautious of sedation  Right shoulder rotator cuff tendon disorder Persistent right shoulder pain with limited range of motion, likely rotator cuff tendon issue. - Refer to shoulder specialist for further evaluation.  I sent a referral back to [facility] and requested that she see a shoulder orthopedic surgeon  Varicose veins of lower extremity Varicose veins may contribute to differential diagnosis of blood clot in context of right groin pain.  Will get an ultrasound to rule out DVT  General Health Maintenance Due for flu shot. - Administer flu shot.  Signed Harlene Schroeder, MD  Received x-rays, message to  patient  DG Pelvis 1-2 Views Result Date: 04/07/2024 CLINICAL DATA:  Pain to the right groin EXAM: PELVIS - 1-2 VIEW COMPARISON:  09/04/2023 FINDINGS: SI joints are non widened. Pubic symphysis and rami appear intact. No fracture or malalignment. Mild hip degenerative change IMPRESSION: Mild hip degenerative change. Electronically Signed   By: Luke Scott HERO.D.  On: 04/07/2024 15:11   DG FEMUR, MIN 2 VIEWS RIGHT Result Date: 04/07/2024 CLINICAL DATA:  Right inner thigh and inguinal pain, right hip pain EXAM: RIGHT FEMUR 2 VIEWS COMPARISON:  09/04/2023 FINDINGS: Frontal and lateral views of the right femur are obtained. No acute displaced fracture. Alignment of the right hip and knee is anatomic, with stable mild right hip osteoarthritis. Soft tissues are grossly unremarkable. IMPRESSION: 1. Stable right hip osteoarthritis. 2. No acute bony abnormality. Electronically Signed   By: Ozell Daring M.D.   On: 04/07/2024 15:09

## 2024-04-07 ENCOUNTER — Ambulatory Visit: Admitting: Family Medicine

## 2024-04-07 ENCOUNTER — Encounter: Payer: Self-pay | Admitting: Family Medicine

## 2024-04-07 ENCOUNTER — Ambulatory Visit (HOSPITAL_BASED_OUTPATIENT_CLINIC_OR_DEPARTMENT_OTHER)
Admission: RE | Admit: 2024-04-07 | Discharge: 2024-04-07 | Disposition: A | Discharge status: Home / Self Care | Source: Ambulatory Visit | Attending: Family Medicine | Admitting: Family Medicine

## 2024-04-07 ENCOUNTER — Other Ambulatory Visit: Payer: Self-pay | Admitting: Family Medicine

## 2024-04-07 VITALS — BP 116/84 | HR 98 | Ht 67.0 in | Wt 161.4 lb

## 2024-04-07 DIAGNOSIS — M79651 Pain in right thigh: Secondary | ICD-10-CM

## 2024-04-07 DIAGNOSIS — M25511 Pain in right shoulder: Secondary | ICD-10-CM

## 2024-04-07 DIAGNOSIS — Z23 Encounter for immunization: Secondary | ICD-10-CM

## 2024-04-07 DIAGNOSIS — G8929 Other chronic pain: Secondary | ICD-10-CM

## 2024-04-07 MED ORDER — CYCLOBENZAPRINE HCL 10 MG PO TABS: 10.0000 mg | ORAL_TABLET | Freq: Two times a day (BID) | ORAL | 0 refills | Status: DC

## 2024-04-07 NOTE — Patient Instructions (Addendum)
 Good to see you today!  Please go to imaging- we are going to take x-rays of your right hip and femur, also please set up an ultrasound of your right leg within the next couple of days Try heat, gentle massage and gentle stretching/ range of motion for your sore inner thigh muscles Lidocaine  patch may also help  Referral to ortho to see a shoulder surgeon  Use tylenol  as needed for pain I also gave you a muscle relaxer- cyclobenzaprine . Just watch out for sedation

## 2024-04-08 ENCOUNTER — Encounter: Payer: Self-pay | Admitting: Family Medicine

## 2024-04-08 ENCOUNTER — Ambulatory Visit (HOSPITAL_BASED_OUTPATIENT_CLINIC_OR_DEPARTMENT_OTHER)
Admission: RE | Admit: 2024-04-08 | Discharge: 2024-04-08 | Disposition: A | Discharge status: Home / Self Care | Source: Ambulatory Visit | Attending: Family Medicine | Admitting: Family Medicine

## 2024-04-08 DIAGNOSIS — M79651 Pain in right thigh: Secondary | ICD-10-CM | POA: Insufficient documentation

## 2024-04-21 ENCOUNTER — Encounter: Payer: Self-pay | Admitting: Physician Assistant

## 2024-04-21 ENCOUNTER — Other Ambulatory Visit (INDEPENDENT_AMBULATORY_CARE_PROVIDER_SITE_OTHER)

## 2024-04-21 ENCOUNTER — Ambulatory Visit (INDEPENDENT_AMBULATORY_CARE_PROVIDER_SITE_OTHER): Admitting: Physician Assistant

## 2024-04-21 DIAGNOSIS — M25511 Pain in right shoulder: Secondary | ICD-10-CM | POA: Diagnosis not present

## 2024-04-21 DIAGNOSIS — G8929 Other chronic pain: Secondary | ICD-10-CM

## 2024-04-21 NOTE — Progress Notes (Signed)
 Office Visit Note   Patient: Margaret Sims           Date of Birth: 08/12/1960           MRN: [id number] Visit Date: 04/21/2024              Requested by: Watt Harlene BROCKS, MD [address] [place],  [place] [address] PCP: Watt Harlene BROCKS, MD   Assessment & Plan: Visit Diagnoses:  1. Chronic right shoulder pain     Plan: Impression is chronic right shoulder pain no longer alleviated with subacromial cortisone injections and recent 6 week course of physical therapy.  At this point, we have discussed obtaining an MRI to assess her rotator cuff.  She will follow-up with us  once this has been completed.  Call with concerns or questions.  Follow-Up Instructions: Return for f/u after MRI.   Orders:  Orders Placed This Encounter  Procedures   XR Shoulder Right   No orders of the defined types were placed in this encounter.     Procedures: No procedures performed   Clinical Data: No additional findings.   Subjective: Chief Complaint  Patient presents with   Right Shoulder - Pain    HPI patient is a pleasant 63 year old female who comes in today with right shoulder pain.  Symptoms began back in 2020.  She denies ever having an injury or change in activity leading up to her symptoms.  She has been seen elsewhere for this for the past several years.  She tells me she has undergone multiple subacromial cortisone injections with the last 1 being in May of this past year.  Initial injections did help but this last injection provided no relief.  The pain she is having is to the top of the shoulder which radiates into the deltoid and into the biceps.  Pain is constant but worse with internal rotation such as when she is reaching to get something out of her backseat in the car.  She also has pain with shoulder abduction as well as when she is lying on her right side.  She gets relief with rest.  She has tried multiple over-the-counter pain medication without relief.   She has been to physical therapy recently which did not provide any relief.  She has not undergone MRI of the right shoulder.  Review of Systems as detailed in HPI.  All others reviewed and are negative.   Objective: Vital Signs: There were no vitals taken for this visit.  Physical Exam well-developed and well-nourished female in no acute distress.  Alert and oriented x 3.   Ortho Exam right shoulder exam: Forward flexion to approximately 160 degrees.  External rotation to 45 degrees and equal both sides.  Internal rotation to T12.  Marked pain with empty can testing.  She does have moderate pain with speeds testing.  No pain with O'Brien's testing.  She is neurovascularly intact distally.  Specialty Comments:  No specialty comments available.  Imaging: XR Shoulder Right Result Date: 04/21/2024 X-rays demonstrate mild degenerative changes around the [facility] joint.  No superior migration of the humeral head.    PMFS History: Patient Active Problem List   Diagnosis Date Noted   COVID-19 01/27/2023   Abdominal bruit 11/12/2022   Coronary artery disease 11/12/2022   Acid reflux 11/01/2022   High cholesterol 11/01/2022   Ventricular tachycardia (HCC) 02/21/2022   Syncope and collapse 01/17/2022   Type 2 diabetes mellitus with diabetic neuropathy, unspecified (  HCC) 05/01/2020   Adhesive capsulitis of left shoulder 12/07/2018   Encounter for gynecological examination 12/07/2018   Obesity 12/07/2018   Essential hypertension 02/19/2016   Dyslipidemia 02/19/2016   Tobacco use disorder 02/19/2016   Hypercholesterolemia 06/23/2013   Diverticulitis of colon 03/05/2012   Past Medical History:  Diagnosis Date   Acid reflux    Adhesive capsulitis of left shoulder 12/07/2018   Diverticulitis of colon 03/05/2012   Formatting of this note might be different from the original. IMPRESSION: Colonoscopy reveals multiple areas of diverticulosis. Will treat for suspected diverticulitis. RTO if sxs  worsen or do not improve. gwt 03/05/2012   Dyslipidemia 02/19/2016   Encounter for gynecological examination 12/07/2018   Essential hypertension 02/19/2016   High cholesterol    Hypercholesterolemia 06/23/2013   Formatting of this note might be different from the original. IMPRESSION: The goal is to have your total cholesterol < 200, the HDL (good cholesterol) >40, and the LDL (bad cholesterol) <100.  And triglycerides should be under 150.`E1o3L`It is recommended that you follow a good diet low in animal and dairy fat and exercise for at least 45 minutes a day 5-6 days a week. `E1o3L`*LDL still high at 14   Obesity 12/07/2018   Syncope and collapse 01/17/2022   Tobacco use disorder 02/19/2016   Type 2 diabetes mellitus with diabetic neuropathy, unspecified (HCC) 05/01/2020   Ventricular tachycardia (HCC) 02/21/2022    Family History  Problem Relation Age of Onset   Stroke Maternal Grandmother    Colon cancer Neg Hx    Colon polyps Neg Hx    Esophageal cancer Neg Hx    Rectal cancer Neg Hx    Stomach cancer Neg Hx     Past Surgical History:  Procedure Laterality Date   CESAREAN SECTION     COLONOSCOPY  2014   normal   Social History   Occupational History   Not on file  Tobacco Use   Smoking status: Every Day    Current packs/day: 0.00    Types: Cigarettes    Last attempt to quit: 09/05/2021    Years since quitting: 2.6   Smokeless tobacco: Never   Tobacco comments:    11/13/23: Has not had a cigarette in a month.  Vaping Use   Vaping status: Never Used  Substance and Sexual Activity   Alcohol use: Yes    Comment: 1-2 times per week   Drug use: Never   Sexual activity: Yes    Birth control/protection: Post-menopausal

## 2024-04-23 ENCOUNTER — Encounter: Payer: Self-pay | Admitting: Family Medicine

## 2024-04-23 ENCOUNTER — Other Ambulatory Visit (INDEPENDENT_AMBULATORY_CARE_PROVIDER_SITE_OTHER)

## 2024-04-23 DIAGNOSIS — M79651 Pain in right thigh: Secondary | ICD-10-CM

## 2024-04-23 LAB — BASIC METABOLIC PANEL WITH GFR
BUN: 15 mg/dL (ref 6–23)
CO2: 29 meq/L (ref 19–32)
Calcium: 9.4 mg/dL (ref 8.4–10.5)
Chloride: 104 meq/L (ref 96–112)
Creatinine, Ser: 1.1 mg/dL (ref 0.40–1.20)
GFR: 53.16 mL/min — ABNORMAL LOW (ref >60.00)
Glucose, Bld: 97 mg/dL (ref 70–99)
Potassium: 3.9 meq/L (ref 3.5–5.1)
Sodium: 140 meq/L (ref 135–145)

## 2024-04-23 NOTE — Addendum Note (Signed)
 Addended by: WATT RAISIN C on: 04/23/2024 06:28 AM   Modules accepted: Orders

## 2024-04-27 NOTE — Telephone Encounter (Signed)
 What's the status of her MRI?

## 2024-04-28 ENCOUNTER — Encounter (HOSPITAL_BASED_OUTPATIENT_CLINIC_OR_DEPARTMENT_OTHER): Payer: Self-pay

## 2024-04-28 ENCOUNTER — Ambulatory Visit (HOSPITAL_BASED_OUTPATIENT_CLINIC_OR_DEPARTMENT_OTHER)
Admission: RE | Admit: 2024-04-28 | Discharge: 2024-04-28 | Disposition: A | Discharge status: Home / Self Care | Source: Ambulatory Visit | Attending: Family Medicine | Admitting: Family Medicine

## 2024-04-28 DIAGNOSIS — M79651 Pain in right thigh: Secondary | ICD-10-CM | POA: Insufficient documentation

## 2024-04-28 MED ORDER — IOHEXOL 300 MG/ML  SOLN
100.0000 mL | Freq: Once | INTRAMUSCULAR | Status: AC | PRN
  Administered 2024-04-28: 100 mL via INTRAVENOUS

## 2024-04-29 ENCOUNTER — Encounter: Payer: Self-pay | Admitting: Family Medicine

## 2024-04-30 ENCOUNTER — Telehealth: Payer: Self-pay | Admitting: Physician Assistant

## 2024-04-30 NOTE — Telephone Encounter (Signed)
 No problem at all!  They should be reaching out to her this morning if they have not already!

## 2024-04-30 NOTE — Telephone Encounter (Signed)
 Exchanged messages with this patients pcp last night.  Sounds like she has been having right thigh pain making walking difficult.  Ct femur showed moderate oa to the right hip.  My guess is she needs a right hip cortisone inj.  Any chance brooks can see her this afternoon?  If not, ok to work in with me

## 2024-04-30 NOTE — Telephone Encounter (Signed)
 Sent a message to Jinnie to see if she can be worked in today.[email or web address]

## 2024-05-04 ENCOUNTER — Ambulatory Visit (HOSPITAL_BASED_OUTPATIENT_CLINIC_OR_DEPARTMENT_OTHER)
Admission: RE | Admit: 2024-05-04 | Discharge: 2024-05-04 | Disposition: A | Discharge status: Home / Self Care | Source: Ambulatory Visit | Attending: Orthopaedic Surgery | Admitting: Orthopaedic Surgery

## 2024-05-04 DIAGNOSIS — G8929 Other chronic pain: Secondary | ICD-10-CM | POA: Diagnosis present

## 2024-05-04 DIAGNOSIS — M25511 Pain in right shoulder: Secondary | ICD-10-CM | POA: Diagnosis present

## 2024-05-05 ENCOUNTER — Encounter: Payer: Self-pay | Admitting: Sports Medicine

## 2024-05-05 ENCOUNTER — Ambulatory Visit: Admitting: Sports Medicine

## 2024-05-05 ENCOUNTER — Other Ambulatory Visit: Payer: Self-pay

## 2024-05-05 DIAGNOSIS — M79651 Pain in right thigh: Secondary | ICD-10-CM | POA: Diagnosis not present

## 2024-05-05 DIAGNOSIS — M1611 Unilateral primary osteoarthritis, right hip: Secondary | ICD-10-CM

## 2024-05-05 MED ORDER — LIDOCAINE HCL 1 % IJ SOLN
4.0000 mL | INTRAMUSCULAR | Status: AC | PRN
  Administered 2024-05-05: 4 mL

## 2024-05-05 MED ORDER — METHYLPREDNISOLONE ACETATE 40 MG/ML IJ SUSP
80.0000 mg | INTRAMUSCULAR | Status: AC | PRN
  Administered 2024-05-05: 80 mg via INTRA_ARTICULAR

## 2024-05-05 NOTE — Progress Notes (Signed)
 Patient says that she has had pain in the right leg for about a month now. She does have pain in the anterior hip and groin, as well as through the inner leg. She has had some right knee pain over the last few days. She denies any numbness or tingling in the leg, or any pain that goes beyond the knee to the foot. She has taken Tylenol  for her pain, although that does not give her any relief. Heat does give her some temporary relief. She has had imaging done prior to this appointment, and does have questions regarding this imaging.

## 2024-05-05 NOTE — Progress Notes (Signed)
 Margaret Sims - 63 y.o. female MRN [id number]  Date of birth: 1960/10/01  Office Visit Note: Visit Date: 05/05/2024 PCP: Watt Harlene BROCKS, MD Referred by: Watt Harlene BROCKS, MD  Subjective: Chief Complaint  Patient presents with   Right Hip - Pain   HPI: Margaret Sims is a pleasant 63 y.o. female who presents today for right anterior hip and groin pain for just over 1 month now.  She has pain all the time but this certainly worsens with transitioning from sitting to standing, getting in and out of the car or raising the leg.  She denies any numbness or tingling.  She has used IcyHot as well as Tylenol .  Has tried cyclobenzaprine  in the past with mild relief but this more so just makes her sleepy.  She is a type II diabetic but is well-controlled.  She is managed on metformin  500 mg daily. Lab Results  Component Value Date   HGBA1C 6.7 (H) 03/03/2024   She is unable to take NSAIDs as she is on Eliquis  5mg  BID.  Pertinent ROS were reviewed with the patient and found to be negative unless otherwise specified above in HPI.   Assessment & Plan: Visit Diagnoses:  1. Unilateral primary osteoarthritis, right hip   2. Pain in right thigh    Plan: Impression is right anterior hip groin and thigh pain greater than 1 month with symptoms and imaging seem to be emanating from her intra-articular hip with at least moderate hip arthritis on x-ray and CT scan.  She does have a small fat-containing hernia, although this does not seem to be displacing anything and there is no evidence of strangulation, I believe this is more of an incidental finding.  Provocatively on physical examination she has an irritable right hip.  For both diagnostic and therapeutic purposes, we did proceed with an ultrasound-guided right intra-articular hip injection.  Patient tolerated well, advised on postinjection protocol.  She may use ice/heat as well as Tylenol  for any pain going forward.  I would like her to pay  attention over the next 2 weeks to see the exact degree of relief she receives.  She does have follow-up with Morna Nadene Cummins to review her shoulder MRI, she can provide them an update for the hip at that time.  I am happy to see her back as needed.  Follow-up: Return for f/u with Morna Nadene Cummins as needed.   Meds & Orders: No orders of the defined types were placed in this encounter.   Orders Placed This Encounter  Procedures   Large Joint Inj: R hip joint   US  Guided Needle Placement - No Linked Charges     Procedures: Large Joint Inj: R hip joint on 05/05/2024 1:46 PM Indications: pain Details: 22 G 3.5 in needle, ultrasound-guided anterior approach Medications: 4 mL lidocaine  1 %; 80 mg methylPREDNISolone acetate 40 MG/ML Outcome: tolerated well, no immediate complications  Procedure: US -guided intra-articular hip injection, Right After discussion on risks/benefits/indications and informed verbal consent was obtained, a timeout was performed. Patient was lying supine on exam table. The hip was cleaned with betadine and alcohol swabs. Then utilizing ultrasound guidance, the patient's femoral head and neck junction was identified and subsequently injected with 4:2 lidocaine :depomedrol via an in-plane approach with ultrasound visualization of the injectate administered into the hip joint. Patient tolerated procedure well without immediate complications.  *Procedure performed with Dr. Prentice Agent Procedure, treatment alternatives, risks and benefits explained, specific risks discussed. Consent was given  by the patient. Immediately prior to procedure a time out was called to verify the correct patient, procedure, equipment, support staff and site/side marked as required. Patient was prepped and draped in the usual sterile fashion.          Clinical History: No specialty comments available.  She reports that she has been smoking cigarettes. She has never used smokeless  tobacco.  Recent Labs    09/03/23 1427 03/03/24 1421  HGBA1C 6.6* 6.7*    Objective:    Physical Exam  Gen: Well-appearing, in no acute distress; non-toxic CV: Well-perfused. Warm.  Resp: Breathing unlabored on room air; no wheezing. Psych: Fluid speech in conversation; appropriate affect; normal thought process  Ortho Exam - Right hip: No ASIS TTP.  There is pain and limitation with internal logroll.  There is markedly positive Stinchfield as well as FADIR and to a limited degree FABER testing as well compared to negative provocative tests of the left hip.  Imaging:  *I did review right hip x-rays from 09/04/2023 as well as recent CT femur from 04/29/2024 during visit today.  CT FEMUR RIGHT W CONTRAST EXAM: CT OF THE RIGHT FEMUR, WITH IV CONTRAST 04/28/2024 11:10:44 AM  TECHNIQUE: Axial images were acquired through the right femur with IV contrast. Reformatted images were reviewed. Automated exposure control, iterative reconstruction, and/or weight based adjustment of the mA/kV was utilized to reduce the radiation dose to as low as reasonably achievable.  COMPARISON: Right femur radiographs dated 04/07/2024.  CLINICAL HISTORY: Right thigh and groin pain.  FINDINGS:  BONES/JOINTS: No evidence of acute fracture or dislocation. No appreciable focal cortical thickening or periostitis. No aggressive osseous lesion. Moderate osteoarthritis of the right hip with joint space narrowing and marginal osteophytosis. Mild tricompartmental osteoarthritis of the right knee. No sizable joint effusion. Mild degenerative arthropathy of the pubic symphysis.  SOFT TISSUES: No appreciable acute soft tissue abnormality. No significant muscle atrophy. No loculated fluid collection. Calcified and noncalcified atherosclerotic plaque of the right femoral artery.  INTRAPELVIC CONTENTS: Small fat-containing right inguinal hernia. Visualized intrapelvic contents are otherwise grossly  unremarkable.  IMPRESSION: 1. No acute osseous abnormality. 2. Moderate osteoarthritis of the right hip. 3. Mild tricompartmental osteoarthritis of the right knee. 4. Mild degenerative arthropathy of the pubic symphysis. 5. Small fat-containing right inguinal hernia. 6. Calcified and noncalcified atherosclerotic plaque of the right femoral artery.  Electronically signed by: Harrietta Sherry MD 04/29/2024 03:38 PM EDT RP Workstation: [id number]  Past Medical/Family/Surgical/Social History: Medications & Allergies reviewed per EMR, new medications updated. 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 IMPRESSION: Colonoscopy reveals multiple areas of diverticulosis. Will treat for suspected diverticulitis. RTO if sxs worsen or do not improve. gwt 03/05/2012   Dyslipidemia 02/19/2016  Encounter for gynecological examination 12/07/2018   Essential hypertension 02/19/2016   High cholesterol    Hypercholesterolemia 06/23/2013   Formatting of this note might be different from the original. IMPRESSION: The goal is to have your total cholesterol < 200, the HDL (good cholesterol) >40, and the  LDL (bad cholesterol) <100.  And triglycerides should be under 150.`E1o3L`It is recommended that you follow a good diet low in animal and dairy fat and exercise for at least 45 minutes a day 5-6 days a week. `E1o3L`*LDL still high at 14   Obesity 12/07/2018   Syncope and collapse 01/17/2022   Tobacco use disorder 02/19/2016   Type 2 diabetes mellitus with diabetic neuropathy, unspecified (HCC) 05/01/2020   Ventricular tachycardia (HCC) 02/21/2022   Family History  Problem Relation Age of Onset   Stroke Maternal Grandmother    Colon cancer Neg Hx    Colon polyps Neg Hx    Esophageal cancer Neg Hx    Rectal cancer Neg Hx    Stomach cancer Neg Hx    Past Surgical History:  Procedure Laterality Date   CESAREAN SECTION     COLONOSCOPY  2014   normal   Social History   Occupational History   Not on file  Tobacco Use   Smoking status: Every Day    Current packs/day: 0.00    Types: Cigarettes    Last attempt to quit: 09/05/2021    Years since quitting: 2.6   Smokeless tobacco: Never   Tobacco comments:    11/13/23: Has not had a cigarette in a month.  Vaping Use   Vaping status: Never Used  Substance and Sexual Activity   Alcohol use: Yes    Comment: 1-2 times per week   Drug use: Never   Sexual activity: Yes    Birth control/protection: Post-menopausal

## 2024-05-06 ENCOUNTER — Ambulatory Visit: Payer: Self-pay | Admitting: Orthopaedic Surgery

## 2024-05-06 NOTE — Progress Notes (Signed)
Needs fu appt thanks.

## 2024-05-10 ENCOUNTER — Encounter: Payer: Self-pay | Admitting: Radiology

## 2024-05-18 ENCOUNTER — Ambulatory Visit: Admitting: Orthopaedic Surgery

## 2024-05-18 DIAGNOSIS — M25511 Pain in right shoulder: Secondary | ICD-10-CM | POA: Diagnosis not present

## 2024-05-18 DIAGNOSIS — G8929 Other chronic pain: Secondary | ICD-10-CM | POA: Diagnosis not present

## 2024-05-18 NOTE — Progress Notes (Signed)
 Office Visit Note   Patient: Margaret Sims           Date of Birth: [birth year]           MRN: [id number] Visit Date: 05/18/2024              Requested by: Watt Harlene BROCKS, MD [address] [place],  [place] [address] PCP: Watt Harlene BROCKS, MD   Assessment & Plan: Visit Diagnoses:  1. Chronic right shoulder pain     Plan: History of Present Illness Margaret Sims is a 63 year old female who presents with right shoulder pain and a recent MRI showing a rotator cuff tear. She was referred by Dr. Lonell Sprang for evaluation of her shoulder pain and to discuss the results of her MRI.  She experiences persistent right shoulder pain, rated as 4 to 5 out of 10. An MRI indicates a tear in the right shoulder rotator cuff. She has received three steroid injections in her shoulder, the last in March 2025, without relief. She is concerned about the potential return of pain once the effects of the recent steroid injection wear off. She has not engaged in physical therapy recently, although she attended four weeks of physical therapy following her first shoulder injection.  Results RADIOLOGY Right shoulder MRI: Small rotator cuff tear with severe tendinosis of supraspinatus  Assessment and Plan Degenerative right rotator cuff tear with severe tendinosis Small degenerative tear with significant tendinosis. Pain manageable at 4-5/10. Previous steroid injections provided temporary relief. Surgery considered last resort due to poor tissue quality and recurrence risk. - Referred to physical therapy for shoulder rehabilitation. - Discuss with Dr. Sprang regarding another steroid injection. - Avoid surgery unless pain becomes unmanageable.  Follow-Up Instructions: No follow-ups on file.   Orders:  Orders Placed This Encounter  Procedures   Ambulatory referral to Physical Therapy   No orders of the defined types were placed in this encounter.     Procedures: No procedures  performed   Clinical Data: No additional findings.   Subjective: Chief Complaint  Patient presents with   Right Shoulder - Pain    HPI  Review of Systems  Constitutional: Negative.   HENT: Negative.    Eyes: Negative.   Respiratory: Negative.    Cardiovascular: Negative.   Endocrine: Negative.   Musculoskeletal: Negative.   Neurological: Negative.   Hematological: Negative.   Psychiatric/Behavioral: Negative.    All other systems reviewed and are negative.    Objective: Vital Signs: There were no vitals taken for this visit.  Physical Exam Vitals and nursing note reviewed.  Constitutional:      Appearance: She is well-developed.  HENT:     Head: Atraumatic.     Nose: Nose normal.  Eyes:     Extraocular Movements: Extraocular movements intact.  Cardiovascular:     Pulses: Normal pulses.  Pulmonary:     Effort: Pulmonary effort is normal.  Abdominal:     Palpations: Abdomen is soft.  Musculoskeletal:     Cervical back: Neck supple.  Skin:    General: Skin is warm.     Capillary Refill: Capillary refill takes less than 2 seconds.  Neurological:     Mental Status: She is alert. Mental status is at baseline.  Psychiatric:        Behavior: Behavior normal.        Thought Content: Thought content normal.        Judgment: Judgment normal.  Ortho Exam  Specialty Comments:  No specialty comments available.  Imaging: No results found.   PMFS History: Patient Active Problem List   Diagnosis Date Noted   Chronic right shoulder pain 05/18/2024   COVID-19 01/27/2023   Abdominal bruit 11/12/2022   Coronary artery disease 11/12/2022   Acid reflux 11/01/2022   High cholesterol 11/01/2022   Ventricular tachycardia (HCC) 02/21/2022   Syncope and collapse 01/17/2022   Type 2 diabetes mellitus with diabetic neuropathy, unspecified (HCC) 05/01/2020   Adhesive capsulitis of left shoulder 12/07/2018   Encounter for gynecological examination 12/07/2018    Obesity 12/07/2018   Essential hypertension 02/19/2016   Dyslipidemia 02/19/2016   Tobacco use disorder 02/19/2016   Hypercholesterolemia 06/23/2013   Diverticulitis of colon 03/05/2012   Past Medical History:  Diagnosis Date   Acid reflux    Adhesive capsulitis of left shoulder 12/07/2018   Diverticulitis of colon 03/05/2012   Formatting of this note might be different from the original. IMPRESSION: Colonoscopy reveals multiple areas of diverticulosis. Will treat for suspected diverticulitis. RTO if sxs worsen or do not improve. gwt 03/05/2012   Dyslipidemia 02/19/2016   Encounter for gynecological examination 12/07/2018   Essential hypertension 02/19/2016   High cholesterol    Hypercholesterolemia 06/23/2013   Formatting of this note might be different from the original. IMPRESSION: The goal is to have your total cholesterol < 200, the HDL (good cholesterol) >40, and the LDL (bad cholesterol) <100.  And triglycerides should be under 150.`E1o3L`It is recommended that you follow a good diet low in animal and dairy fat and exercise for at least 45 minutes a day 5-6 days a week. `E1o3L`*LDL still high at 14   Obesity 12/07/2018   Syncope and collapse 01/17/2022   Tobacco use disorder 02/19/2016   Type 2 diabetes mellitus with diabetic neuropathy, unspecified (HCC) 05/01/2020   Ventricular tachycardia (HCC) 02/21/2022    Family History  Problem Relation Age of Onset   Stroke Maternal Grandmother    Colon cancer Neg Hx    Colon polyps Neg Hx    Esophageal cancer Neg Hx    Rectal cancer Neg Hx    Stomach cancer Neg Hx     Past Surgical History:  Procedure Laterality Date   CESAREAN SECTION     COLONOSCOPY  2014   normal   Social History   Occupational History   Not on file  Tobacco Use   Smoking status: Every Day    Current packs/day: 0.00    Types: Cigarettes    Last attempt to quit: 09/05/2021    Years since quitting: 2.7   Smokeless tobacco: Never   Tobacco comments:     11/13/23: Has not had a cigarette in a month.  Vaping Use   Vaping status: Never Used  Substance and Sexual Activity   Alcohol use: Yes    Comment: 1-2 times per week   Drug use: Never   Sexual activity: Yes    Birth control/protection: Post-menopausal

## 2024-05-24 ENCOUNTER — Other Ambulatory Visit: Payer: Self-pay | Admitting: Physician Assistant

## 2024-05-24 ENCOUNTER — Telehealth: Payer: Self-pay | Admitting: Sports Medicine

## 2024-05-24 MED ORDER — TRAMADOL HCL 50 MG PO TABS: 50.0000 mg | ORAL_TABLET | Freq: Two times a day (BID) | ORAL | 1 refills | Status: DC | PRN

## 2024-05-24 NOTE — Telephone Encounter (Signed)
 Assuming pain is from shoulder as all of our recent notes are about her shoulder rather than the hip inj she had with brooks on 10/29?  I sent in tramadol , but if still in pain I would recommend f/u with xu to discuss surgery

## 2024-05-24 NOTE — Telephone Encounter (Signed)
 Patient called and said that after the injection she is still in pain and don't know what to do next. CB#[phone]

## 2024-05-25 ENCOUNTER — Telehealth: Payer: Self-pay | Admitting: Sports Medicine

## 2024-05-25 NOTE — Telephone Encounter (Signed)
 Pt called saying that she is having pain in her rt upper thigh and had a shot on Oct 29, but has gotten no relief form it. Call back number is [phone]

## 2024-05-25 NOTE — Telephone Encounter (Signed)
 Called and scheduled patient in the office for 05/26/2024.

## 2024-05-26 ENCOUNTER — Ambulatory Visit (INDEPENDENT_AMBULATORY_CARE_PROVIDER_SITE_OTHER): Admitting: Sports Medicine

## 2024-05-26 ENCOUNTER — Encounter: Payer: Self-pay | Admitting: Sports Medicine

## 2024-05-26 DIAGNOSIS — M25511 Pain in right shoulder: Secondary | ICD-10-CM

## 2024-05-26 DIAGNOSIS — G8929 Other chronic pain: Secondary | ICD-10-CM

## 2024-05-26 DIAGNOSIS — M25551 Pain in right hip: Secondary | ICD-10-CM

## 2024-05-26 DIAGNOSIS — M1611 Unilateral primary osteoarthritis, right hip: Secondary | ICD-10-CM | POA: Diagnosis not present

## 2024-05-26 NOTE — Progress Notes (Signed)
 Patient says that she got about 50% relief from the right hip injection for 4 days, and then her pain level returned to her baseline pain. In the last week, her pain has gotten much worse, and she now has shooting pain down the front of the leg to the knee. She says that her pain at the hip and in the groin feels deeper than she can touch. She denies any popping or clicking at the hip, or symptoms that go past the knee towards the foot. She uses heat and takes Tylenol , but these do not give her much relief. She is asking about the hernia, and whether that could be contributing to her pain.

## 2024-05-26 NOTE — Progress Notes (Signed)
 [name] - 63 y.o. female MRN [id number]  Date of birth: 10-03-1960  Office Visit Note: Visit Date: 05/26/2024 PCP: Watt Harlene BROCKS, MD Referred by: Watt Harlene BROCKS, MD  Subjective: Chief Complaint  Patient presents with   Right Hip - Follow-up   HPI: Margaret Sims is a pleasant 63 y.o. female who presents today for follow-up of chronic right hip pain.  Margaret Sims has had right hip pain for the last 2 months, although this has been progressive and worsening.  Since her visit last week with Dr. Jerri she has been hobbling because of the pain.  We did perform ultrasound-guided intra-articular hip injection, this gave her good relief but not full relief but only lasted for about 4 or 5 days before her pain slowly started returning.  She notices significant pain when transferring from sitting to standing, getting in/out of the car as well as having to lift the leg up onto the bed/chair excetra.  Pain is in the anterior hip radiates into the groin and down the proximal thigh.  She has no numbness or tingling.  She has been ambulating differently because of her pain.  She is using heat, Tylenol  but this is not giving her much relief.  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  Unable to take NSAIDs.  Pertinent ROS were reviewed with the patient and found to be negative unless otherwise specified above in HPI.   Assessment & Plan: Visit Diagnoses:  1. Unilateral primary osteoarthritis, right hip   2. Chronic right hip pain   3. Chronic right shoulder pain    Plan: Impression is chronic and progressive right hip pain in the setting of at least moderate hip osteoarthritis.  She is hobbling/ambulating differently secondary to the pain.  She received moderate relief from intra-articular hip injection but this did not last more than a week or 2 before her pain returned.  Provocatively and  symptomatically all of her pain is emanating from within the hip joint.  We discussed treatment options such as oral medication, physical therapy, repeating injection therapy down the road versus more permanent treatment such as total hip arthroplasty.  I do think she would be a good candidate for this.  At this point we will have her see Dr. Jerri to discuss this further.  If for some reason he does not feel she is ready for hip replacement, I would like to wait about 1 month and then reconsider intra-articular hip injection for both diagnostic and therapeutic purposes.  For pain control, she will take tramadol  50 mg only as needed for her breakthrough pain.  She is going to formalized physical therapy for the shoulder, could consider adding this to the hip but I do not think it would be largely beneficial given her arthritic change.  Follow-up: Return for Make appt with Dr. Jerri to discuss R-hip THA.   Meds & Orders:  - Begin Tramadol  50mg  PRN, sent to pharmacy 11/17 /25   Procedures: No procedures performed      Clinical History: No specialty comments available.  She reports that she has been smoking cigarettes. She has never used smokeless tobacco.  Recent Labs    09/03/23 1427 03/03/24 1421  HGBA1C 6.6* 6.7*    Objective:    Physical Exam  Gen: Well-appearing, in no acute distress; non-toxic CV: Well-perfused. Warm.  Resp: Breathing unlabored on room air; no wheezing.  Psych: Fluid speech in conversation; appropriate affect; normal thought process  Ortho Exam - Right hip: Patient does walk with an antalgic gait.  She has 2 lift her leg with her hand and opposite foot to swing up onto the table.  There is discomfort and limitation with endrange internal and external logroll.  Positive FADIR, positive Stinchfield testing.  There is hesitancy with FABER testing although no reproduction of pain.  No TTP over the greater trochanteric region.  Imaging:  *I did review right hip x-rays from  09/04/2023 as well as recent CT femur from 04/29/2024 during the office visit today.   CT FEMUR RIGHT W CONTRAST EXAM: CT OF THE RIGHT FEMUR, WITH IV CONTRAST 04/28/2024 11:10:44 AM   TECHNIQUE: Axial images were acquired through the right femur with IV contrast. Reformatted images were reviewed. Automated exposure control, iterative reconstruction, and/or weight based adjustment of the mA/kV was utilized to reduce the radiation dose to as low as reasonably achievable.   COMPARISON: Right femur radiographs dated 04/07/2024.   CLINICAL HISTORY: Right thigh and groin pain.   FINDINGS:   BONES/JOINTS: No evidence of acute fracture or dislocation. No appreciable focal cortical thickening or periostitis. No aggressive osseous lesion. Moderate osteoarthritis of the right hip with joint space narrowing and marginal osteophytosis. Mild tricompartmental osteoarthritis of the right knee. No sizable joint effusion. Mild degenerative arthropathy of the pubic symphysis.   SOFT TISSUES: No appreciable acute soft tissue abnormality. No significant muscle atrophy. No loculated fluid collection. Calcified and noncalcified atherosclerotic plaque of the right femoral artery.   INTRAPELVIC CONTENTS: Small fat-containing right inguinal hernia. Visualized intrapelvic contents are otherwise grossly unremarkable.   IMPRESSION: 1. No acute osseous abnormality. 2. Moderate osteoarthritis of the right hip. 3. Mild tricompartmental osteoarthritis of the right knee. 4. Mild degenerative arthropathy of the pubic symphysis. 5. Small fat-containing right inguinal hernia. 6. Calcified and noncalcified atherosclerotic plaque of the right femoral artery.   Electronically signed by: Harrietta Sherry MD 04/29/2024 03:38 PM EDT RP Workstation: [id number]  Past Medical/Family/Surgical/Social History: Medications & Allergies reviewed per EMR, new medications updated. Patient Active Problem List   Diagnosis  Date Noted   Chronic right shoulder pain 05/18/2024   COVID-19 01/27/2023   Abdominal bruit 11/12/2022   Coronary artery disease 11/12/2022   Acid reflux 11/01/2022   High cholesterol 11/01/2022   Ventricular tachycardia (HCC) 02/21/2022   Syncope and collapse 01/17/2022   Type 2 diabetes mellitus with diabetic neuropathy, unspecified (HCC) 05/01/2020   Adhesive capsulitis of left shoulder 12/07/2018   Encounter for gynecological examination 12/07/2018   Obesity 12/07/2018   Essential hypertension 02/19/2016   Dyslipidemia 02/19/2016   Tobacco use disorder 02/19/2016   Hypercholesterolemia 06/23/2013   Diverticulitis of colon 03/05/2012   Past Medical History:  Diagnosis Date   Acid reflux    Adhesive capsulitis of left shoulder 12/07/2018   Diverticulitis of colon 03/05/2012   Formatting of this note might be different from the original. IMPRESSION: Colonoscopy reveals multiple areas of diverticulosis. Will treat for suspected diverticulitis. RTO if sxs worsen or do not improve. gwt 03/05/2012   Dyslipidemia 02/19/2016   Encounter for gynecological examination 12/07/2018   Essential hypertension 02/19/2016   High cholesterol    Hypercholesterolemia 06/23/2013   Formatting of this note might be different from the original. IMPRESSION: The goal is to have your total cholesterol < 200, the HDL (good cholesterol) >40, and the LDL (bad cholesterol) <100.  And triglycerides should be under 150.`E1o3L`It is recommended that you  follow a good diet low in animal and dairy fat and exercise for at least 45 minutes a day 5-6 days a week. `E1o3L`*LDL still high at 14   Obesity 12/07/2018   Syncope and collapse 01/17/2022   Tobacco use disorder 02/19/2016   Type 2 diabetes mellitus with diabetic neuropathy, unspecified (HCC) 05/01/2020   Ventricular tachycardia (HCC) 02/21/2022   Family History  Problem Relation Age of Onset   Stroke Maternal Grandmother    Colon cancer Neg Hx    Colon  polyps Neg Hx    Esophageal cancer Neg Hx    Rectal cancer Neg Hx    Stomach cancer Neg Hx    Past Surgical History:  Procedure Laterality Date   CESAREAN SECTION     COLONOSCOPY  2014   normal   Social History   Occupational History   Not on file  Tobacco Use   Smoking status: Every Day    Current packs/day: 0.00    Types: Cigarettes    Last attempt to quit: 09/05/2021    Years since quitting: 2.7   Smokeless tobacco: Never   Tobacco comments:    11/13/23: Has not had a cigarette in a month.  Vaping Use   Vaping status: Never Used  Substance and Sexual Activity   Alcohol use: Yes    Comment: 1-2 times per week   Drug use: Never   Sexual activity: Yes    Birth control/protection: Post-menopausal

## 2024-06-08 ENCOUNTER — Encounter: Payer: Self-pay | Admitting: Rehabilitative and Restorative Service Providers"

## 2024-06-08 ENCOUNTER — Ambulatory Visit: Admitting: Rehabilitative and Restorative Service Providers"

## 2024-06-08 DIAGNOSIS — M6281 Muscle weakness (generalized): Secondary | ICD-10-CM

## 2024-06-08 DIAGNOSIS — M25511 Pain in right shoulder: Secondary | ICD-10-CM | POA: Diagnosis not present

## 2024-06-08 DIAGNOSIS — M25551 Pain in right hip: Secondary | ICD-10-CM

## 2024-06-08 DIAGNOSIS — R262 Difficulty in walking, not elsewhere classified: Secondary | ICD-10-CM

## 2024-06-08 DIAGNOSIS — G8929 Other chronic pain: Secondary | ICD-10-CM

## 2024-06-08 NOTE — Therapy (Signed)
 OUTPATIENT PHYSICAL THERAPY EVALUATION   Patient Name: Margaret Sims MRN: [id number] DOB:Jul 05, 1961, 63 y.o., female Today's Date: 06/08/2024  END OF SESSION:  PT End of Session - 06/08/24 1015     Visit Number 1    Number of Visits 6    Date for Recertification  07/20/24    Authorization Type [employer] $37 copay    Progress Note Due on Visit 6    PT Start Time 1017    PT Stop Time 1050    PT Time Calculation (min) 33 min    Activity Tolerance Patient tolerated treatment well    Behavior During Therapy [facility] for tasks assessed/performed          Past Medical History:  Diagnosis Date   Acid reflux    Adhesive capsulitis of left shoulder 12/07/2018   Diverticulitis of colon 03/05/2012   Formatting of this note might be different from the original. IMPRESSION: Colonoscopy reveals multiple areas of diverticulosis. Will treat for suspected diverticulitis. RTO if sxs worsen or do not improve. gwt 03/05/2012   Dyslipidemia 02/19/2016   Encounter for gynecological examination 12/07/2018   Essential hypertension 02/19/2016   High cholesterol    Hypercholesterolemia 06/23/2013   Formatting of this note might be different from the original. IMPRESSION: The goal is to have your total cholesterol < 200, the HDL (good cholesterol) >40, and the LDL (bad cholesterol) <100.  And triglycerides should be under 150.`E1o3L`It is recommended that you follow a good diet low in animal and dairy fat and exercise for at least 45 minutes a day 5-6 days a week. `E1o3L`*LDL still high at 14   Obesity 12/07/2018   Syncope and collapse 01/17/2022   Tobacco use disorder 02/19/2016   Type 2 diabetes mellitus with diabetic neuropathy, unspecified (HCC) 05/01/2020   Ventricular tachycardia (HCC) 02/21/2022   Past Surgical History:  Procedure Laterality Date   CESAREAN SECTION     COLONOSCOPY  2014   normal   Patient Active Problem List   Diagnosis Date Noted   Chronic right shoulder pain 05/18/2024    COVID-19 01/27/2023   Abdominal bruit 11/12/2022   Coronary artery disease 11/12/2022   Acid reflux 11/01/2022   High cholesterol 11/01/2022   Ventricular tachycardia (HCC) 02/21/2022   Syncope and collapse 01/17/2022   Type 2 diabetes mellitus with diabetic neuropathy, unspecified (HCC) 05/01/2020   Adhesive capsulitis of left shoulder 12/07/2018   Encounter for gynecological examination 12/07/2018   Obesity 12/07/2018   Essential hypertension 02/19/2016   Dyslipidemia 02/19/2016   Tobacco use disorder 02/19/2016   Hypercholesterolemia 06/23/2013   Diverticulitis of colon 03/05/2012    PCP: Watt Raisin MD  REFERRING PROVIDER: Jerri Kay HERO, MD  REFERRING DIAG: [phone] (ICD-10-CM) - Chronic right shoulder pain  THERAPY DIAG:  Chronic right shoulder pain  Pain in right hip  Muscle weakness (generalized)  Difficulty in walking, not elsewhere classified  Rationale for Evaluation and Treatment: Rehabilitation  ONSET DATE: Rt shoulder:  acute on chronic, worse in last year.  Rt hip worsening Sept 2025  SUBJECTIVE:  SUBJECTIVE STATEMENT: Pt indicated off and on trouble with Rt shoulder for a while with a series of 3 injections over several years.  Insidious onset of symptoms.  Pt indicated doing everything with shoulder but having pain with movements like reaching back, hooking bra, reaching.  Pt indicated shoulder to upper arm symptoms.   Pt indicated having improvement with shoulder.    Pt indicated worse hip pain than arm.  Reported insidious worsening for hip since Sept 2025. Reported injection in hip seemed to help the shoulder. Pt indicated meeting with MD about surgical options for hip this week.  Difficulty walking, standing, transfers, sitting achy.    Rt hand dominant.    PERTINENT HISTORY: Medical history: Lt shoulder adhesive capsulitis, HTN, high cholesterol, DM  Chronic Rt hip pain with possible THA discussion.   PAIN:  NPRS scale: at worst Rt shoulder 9/10, at worst in last week 2/10.     Rt hip: 10/10.  Pain location: Rt shoulder, Rt hip Pain description: achy, throbbing.  Aggravating factors: Rt shoulder:   Relieving factors: injection for Rt hip helped Rt shoulder.   PRECAUTIONS: None  WEIGHT BEARING RESTRICTIONS: No  FALLS:  Has patient fallen in last 6 months? No  LIVING ENVIRONMENT: Lives in: House/apartment Flight of stairs to bedroom.   Has SPC   OCCUPATION: Retired   PLOF: Independent, gym activity  PATIENT GOALS:Reduce pain   OBJECTIVE:   PATIENT SURVEYS:  Patient-Specific Activity Scoring Scheme  0 represents "unable to perform." 10 represents "able to perform at prior level. 0 1 2 3 4 5 6 7 8 9  10 (Date and Score)  06/08/2024:  No PSFS indicated due to most likely one visit only for shoulder. Will reassess for any post surgery hip treatment.    Activity      1.       2.       3.     4.    5.    Score     Total score = sum of the activity scores/number of activities Minimum detectable change (90%CI) for average score = 2 points Minimum detectable change (90%CI) for single activity score = 3 points  COGNITION: 06/08/2024 Overall cognitive status: WFL     SENSATION: 06/08/2024 WFL  POSTURE: 06/08/2024 Mild rounded shoulders.   UPPER EXTREMITY ROM:   ROM Right Eval 06/08/2024 Left Eval 06/08/2024  Shoulder flexion [facility]  Shoulder extension    Shoulder abduction    Shoulder adduction    Shoulder internal rotation    Shoulder external rotation    Elbow flexion    Elbow extension    Wrist flexion    Wrist extension        Hand behind back Thumb to T7 Thumb to T4          (Blank rows = not tested)  UPPER EXTREMITY MMT:  MMT Right Eval 06/08/2024 Left Eval 06/08/2024  Shoulder  flexion 4+/5 5/5  Shoulder extension    Shoulder abduction 4+/5 5/5  Shoulder adduction    Shoulder internal rotation 5/5 5/5  Shoulder external rotation 4+/5 5/5  Middle trapezius    Lower trapezius    Elbow flexion 5/5 5/5  Elbow extension 5/5 5/5  Wrist flexion    Wrist extension    Wrist ulnar deviation    Wrist radial deviation    Wrist pronation    Wrist supination    Grip strength (lbs)    (Blank rows = not tested)  SHOULDER SPECIAL  TESTS: 06/08/2024 (-) painful arc, drop arm, lift off.   JOINT MOBILITY TESTING:  06/08/2024 None tested  PALPATION:  06/08/2024 None tested.   AMBULATION 06/08/2024: Ambulation with noted antalgic gait, independent.  Reduced stance on Rt leg with reduced toe off progression.  ER noted in Rt LE.   Improved gait speed, stance and stability with use of SPC in Lt UE after instruction.                                                                                                                                                                                                   TODAY'S TREATMENT:                                                                                                       DATE: 06/08/2024 Therex:    HEP instruction/performance c cues for techniques, handout provided.  Trial set performed of each for comprehension and symptom assessment.  See below for exercise list  Gait Training [facility] education and trial in clinic to help improve ambulation with Rt hip pains.  Discussed sequencing, placement and setting height.  Performed household distance 100 ft with supervision and cues given.  Good replication noted after cues.    PATIENT EDUCATION: 06/08/2024 Education details: HEP, POC Person educated: Patient Education method: Programmer, Multimedia, Demonstration, Verbal cues, and Handouts Education comprehension: verbalized understanding, returned demonstration, and verbal cues required  HOME EXERCISE PROGRAM: Access Code:  [email or web address] URL: [email or web address] Date: 06/08/2024 Prepared by: Ozell Silvan  Exercises - Standing Shoulder Posterior Capsule Stretch (Mirrored)  - 2-3 x daily - 7 x weekly - 1 sets - 5 reps - 15 hold - Shoulder External Rotation and Scapular Retraction  - 2-3 x daily - 7 x weekly - 1 sets - 5 reps - 2 hold - Shoulder External Rotation and Scapular Retraction with Resistance  - 1-2 x daily - 7 x weekly - 1-2 sets - 10-15 reps - Standing Bilateral Low Shoulder Row with Anchored Resistance  - 1-2 x daily - 7 x weekly - 2-3 sets - 10-15 reps - Shoulder Extension with Resistance  - 1-2 x daily - 7 x weekly -  1-2 sets - 10-15 reps  ASSESSMENT:  CLINICAL IMPRESSION: Patient is a 63 y.o. who comes to clinic with complaints of Rt shoulder, Rt hip pain with mobility, strength and movement coordination deficits that impair their ability to perform usual daily and recreational functional activities without increase difficulty/symptoms at this time.  Patient to benefit from skilled PT services to address impairments and limitations to improve to previous level of function without restriction secondary to condition.   OBJECTIVE IMPAIRMENTS: Abnormal gait, decreased activity tolerance, decreased balance, decreased coordination, decreased endurance, decreased mobility, difficulty walking, decreased ROM, decreased strength, impaired perceived functional ability, impaired flexibility, improper body mechanics, and pain.   ACTIVITY LIMITATIONS: carrying, lifting, sitting, standing, squatting, sleeping, stairs, transfers, and locomotion level  PARTICIPATION LIMITATIONS: meal prep, cleaning, laundry, interpersonal relationship, driving, shopping, and community activity  PERSONAL FACTORS: Time since onset of injury/illness/exacerbation and multiple treatment areas are also affecting patient's functional outcome.   REHAB POTENTIAL: Good  CLINICAL DECISION MAKING:  Stable/uncomplicated  EVALUATION COMPLEXITY: Low   GOALS: Goals reviewed with patient? Yes  SHORT TERM GOALS: (target date for Short term goals are 2 weeks 06/22/2024)  1.Patient will demonstrate independent use of home exercise program to maintain progress from in clinic treatments. Goal status: New  LONG TERM GOALS: (target dates for all long term goals are 6 weeks  07/20/2024 )   1. Patient will demonstrate/report pain at worst less than or equal to 2/10 for shoulder to facilitate minimal limitation in daily activity secondary to pain symptoms. Goal status: New   2. Patient will demonstrate independent use of home exercise program to facilitate ability to maintain/progress functional gains from skilled physical therapy services. Goal status: New  Any additional goals to be updated and revised based off surgical planning/ re-evaluation related to Rt hip.    PLAN:  PT FREQUENCY: up to 1x /week (trial HEP to start pending surgical planning.   PT DURATION: 6 weeks  PLANNED INTERVENTIONS: Can include 02853- PT Re-evaluation, 97110-Therapeutic exercises, 97530- Therapeutic activity, [id number]- Neuromuscular re-education, 97535- Self Care, 97140- Manual therapy, [phone]- Gait training, [phone]- Orthotic Fit/training, [phone]- Canalith repositioning, [id number]- Aquatic Therapy, [phone]- Electrical stimulation (unattended), [id number] Physical performance testing, 97016- Vasopneumatic device, [id number]- Ultrasound, [id number]- Traction (mechanical), [id number]- Ionotophoresis 4mg /ml Dexamethasone ,  [id number] - Needle insertion w/o injection 1 or 2 muscles, 20561 - Needle insertion w/o injection 3 or more muscles.   Patient/Family education, Balance training, Stair training, Taping, Dry Needling, Joint mobilization, Joint manipulation, Spinal manipulation, Spinal mobilization, Scar mobilization, Vestibular training, Visual/preceptual remediation/compensation, DME instructions, Cryotherapy, and Moist heat.  All performed as  medically necessary.  All included unless contraindicated  PLAN FOR NEXT SESSION: Review HEP knowledge/results.  Follow up on surgery planning.  Re-eval if returning after surgery.    Ozell Silvan, PT, DPT, OCS, ATC 06/08/24  10:56 AM

## 2024-06-09 ENCOUNTER — Telehealth: Payer: Self-pay | Admitting: Family Medicine

## 2024-06-09 ENCOUNTER — Other Ambulatory Visit: Payer: Self-pay | Admitting: Medical Oncology

## 2024-06-09 ENCOUNTER — Telehealth: Payer: Self-pay

## 2024-06-09 ENCOUNTER — Ambulatory Visit: Admitting: Orthopaedic Surgery

## 2024-06-09 ENCOUNTER — Other Ambulatory Visit

## 2024-06-09 DIAGNOSIS — M1611 Unilateral primary osteoarthritis, right hip: Secondary | ICD-10-CM

## 2024-06-09 NOTE — Telephone Encounter (Signed)
 Pt dropped off pre-op paper to be signed by pcp. Placed paper in pcps box. Please call pt when paper has been faxed.

## 2024-06-09 NOTE — Telephone Encounter (Signed)
 I did not see that she sees a cardiologist.  Thanks for letting me know.  We'll fax a clearance sheet over to Dr. Katherleen office.  Thanks.

## 2024-06-09 NOTE — Telephone Encounter (Signed)
 Clearance forms given to patient for future R THA

## 2024-06-09 NOTE — Progress Notes (Signed)
 Office Visit Note   Patient: Margaret Sims           Date of Birth: [birth year]           MRN: [id number] Visit Date: 06/09/2024              Requested by: Watt Harlene BROCKS, MD [address] [place],  [place] [address] PCP: Watt Harlene BROCKS, MD   Assessment & Plan: Visit Diagnoses:  1. Primary osteoarthritis of right hip     Plan: History of Present Illness Margaret Sims is a 63 year old female with a history of DVT and prediabetes who presents for evaluation of hip pain and consideration of hip replacement. She was referred by Dr. Burnetta for evaluation of a hip replacement.  She has persistent sharp hip pain radiating to the top of her knee that became more frequent and severe after a cortisone injection on October 29th, with only brief mild relief through October 30th and return of pain by October 31st. She now uses a cane regularly since Thanksgiving due to increased limping and sharper pain.  She takes tramadol , which causes stomach upset, and is on Eliquis  after two PEs in April related to prolonged travel and smoking, which she has since quit. Her prediabetes is managed with lifestyle changes and her last hemoglobin A1c was 6.7.  Physical Exam MUSCULOSKELETAL: Pain on internal and external rotation of the hip. No pain on straight leg raise. Pain on resisted hip flexion. No pain on passive hip flexion. No tenderness on lateral hip palpation.  Results LABS HbA1c: 6.7%  Assessment and Plan Primary osteoarthritis of right hip Chronic osteoarthritis with significant pain and functional limitation. Minimal relief from recent cortisone injection. Hip replacement recommended for long-term relief. - Ordered hip x-rays to rule for stress fracture or collapse. - Plan for hip replacement surgery after 90-day waiting period post-cortisone injection, approximately late January. - Obtain surgical clearance from Dr. Watt. - Coordinate with Dr. Tonette for Eliquis   management: discontinue 3 days prior to surgery, resume day after surgery. - Provided educational handout on hip replacement surgery. - Discussed post-operative care, including outpatient physical therapy and pain management.  Impression is severe right hip degenerative joint disease secondary to Osteoarthritis.  Patient has attempted conservative treatment for at least 6 consecutive weeks within the past 12 weeks, including but not limited to physical therapy, home exercise program, NSAIDs, activity modification, and/or corticosteroid injections. Despite these efforts, symptoms have not improved or have worsened. Conservative measures have been deemed unsuccessful at this time. After a detailed discussion covering diagnosis and treatment options--including the risks, benefits, alternatives, and potential complications of surgical and nonsurgical management--the patient elected to proceed with surgery.  Current anticoagulants: Eliquis  (apixaban ) daily Postop anticoagulation: Eliquis  Diabetic: Yes  Prior DVT/PE: Yes Tobacco use: No Clearances needed for surgery: PCP, cardiologist, Heme/Onc Anticipate discharge dispo: home   Follow-Up Instructions: No follow-ups on file.   Orders:  Orders Placed This Encounter  Procedures   XR HIP UNILAT W OR W/O PELVIS 2-3 VIEWS RIGHT   No orders of the defined types were placed in this encounter.     Procedures: No procedures performed   Clinical Data: No additional findings.   Subjective: Chief Complaint  Patient presents with   Right Hip - Pain    HPI  Review of Systems  Constitutional: Negative.   HENT: Negative.    Eyes: Negative.   Respiratory: Negative.    Cardiovascular: Negative.  Endocrine: Negative.   Musculoskeletal: Negative.   Neurological: Negative.   Hematological: Negative.   Psychiatric/Behavioral: Negative.    All other systems reviewed and are negative.    Objective: Vital Signs: There were no vitals taken  for this visit.  Physical Exam Vitals and nursing note reviewed.  Constitutional:      Appearance: She is well-developed.  HENT:     Head: Atraumatic.     Nose: Nose normal.  Eyes:     Extraocular Movements: Extraocular movements intact.  Cardiovascular:     Pulses: Normal pulses.  Pulmonary:     Effort: Pulmonary effort is normal.  Abdominal:     Palpations: Abdomen is soft.  Musculoskeletal:     Cervical back: Neck supple.  Skin:    General: Skin is warm.     Capillary Refill: Capillary refill takes less than 2 seconds.  Neurological:     Mental Status: She is alert. Mental status is at baseline.  Psychiatric:        Behavior: Behavior normal.        Thought Content: Thought content normal.        Judgment: Judgment normal.     Ortho Exam  Specialty Comments:  No specialty comments available.  Imaging: XR HIP UNILAT W OR W/O PELVIS 2-3 VIEWS RIGHT Result Date: 06/09/2024 X-rays of the right hip show advanced degenerative joint disease with bone-on-bone joint space.    PMFS History: Patient Active Problem List   Diagnosis Date Noted   Primary osteoarthritis of right hip 06/09/2024   Chronic right shoulder pain 05/18/2024   COVID-19 01/27/2023   Abdominal bruit 11/12/2022   Coronary artery disease 11/12/2022   Acid reflux 11/01/2022   High cholesterol 11/01/2022   Ventricular tachycardia (HCC) 02/21/2022   Syncope and collapse 01/17/2022   Type 2 diabetes mellitus with diabetic neuropathy, unspecified (HCC) 05/01/2020   Adhesive capsulitis of left shoulder 12/07/2018   Encounter for gynecological examination 12/07/2018   Obesity 12/07/2018   Essential hypertension 02/19/2016   Dyslipidemia 02/19/2016   Tobacco use disorder 02/19/2016   Hypercholesterolemia 06/23/2013   Diverticulitis of colon 03/05/2012   Past Medical History:  Diagnosis Date   Acid reflux    Adhesive capsulitis of left shoulder 12/07/2018   Diverticulitis of colon 03/05/2012    Formatting of this note might be different from the original. IMPRESSION: Colonoscopy reveals multiple areas of diverticulosis. Will treat for suspected diverticulitis. RTO if sxs worsen or do not improve. gwt 03/05/2012   Dyslipidemia 02/19/2016   Encounter for gynecological examination 12/07/2018   Essential hypertension 02/19/2016   High cholesterol    Hypercholesterolemia 06/23/2013   Formatting of this note might be different from the original. IMPRESSION: The goal is to have your total cholesterol < 200, the HDL (good cholesterol) >40, and the LDL (bad cholesterol) <100.  And triglycerides should be under 150.`E1o3L`It is recommended that you follow a good diet low in animal and dairy fat and exercise for at least 45 minutes a day 5-6 days a week. `E1o3L`*LDL still high at 14   Obesity 12/07/2018   Syncope and collapse 01/17/2022   Tobacco use disorder 02/19/2016   Type 2 diabetes mellitus with diabetic neuropathy, unspecified (HCC) 05/01/2020   Ventricular tachycardia (HCC) 02/21/2022    Family History  Problem Relation Age of Onset   Stroke Maternal Grandmother    Colon cancer Neg Hx    Colon polyps Neg Hx    Esophageal cancer Neg Hx    Rectal  cancer Neg Hx    Stomach cancer Neg Hx     Past Surgical History:  Procedure Laterality Date   CESAREAN SECTION     COLONOSCOPY  2014   normal   Social History   Occupational History   Not on file  Tobacco Use   Smoking status: Every Day    Current packs/day: 0.00    Types: Cigarettes    Last attempt to quit: 09/05/2021    Years since quitting: 2.7   Smokeless tobacco: Never   Tobacco comments:    11/13/23: Has not had a cigarette in a month.  Vaping Use   Vaping status: Never Used  Substance and Sexual Activity   Alcohol use: Yes    Comment: 1-2 times per week   Drug use: Never   Sexual activity: Yes    Birth control/protection: Post-menopausal

## 2024-06-09 NOTE — Telephone Encounter (Signed)
 Disregard

## 2024-06-10 ENCOUNTER — Emergency Department (HOSPITAL_BASED_OUTPATIENT_CLINIC_OR_DEPARTMENT_OTHER)
Admission: EM | Admit: 2024-06-10 | Discharge: 2024-06-10 | Disposition: A | Discharge status: Home / Self Care | Attending: Emergency Medicine | Admitting: Emergency Medicine

## 2024-06-10 ENCOUNTER — Other Ambulatory Visit: Payer: Self-pay

## 2024-06-10 ENCOUNTER — Ambulatory Visit: Payer: Self-pay

## 2024-06-10 ENCOUNTER — Telehealth: Payer: Self-pay

## 2024-06-10 ENCOUNTER — Encounter (HOSPITAL_BASED_OUTPATIENT_CLINIC_OR_DEPARTMENT_OTHER): Payer: Self-pay | Admitting: Emergency Medicine

## 2024-06-10 ENCOUNTER — Emergency Department (HOSPITAL_BASED_OUTPATIENT_CLINIC_OR_DEPARTMENT_OTHER)

## 2024-06-10 DIAGNOSIS — Z7982 Long term (current) use of aspirin: Secondary | ICD-10-CM | POA: Insufficient documentation

## 2024-06-10 DIAGNOSIS — R000 Tachycardia, unspecified: Secondary | ICD-10-CM | POA: Diagnosis not present

## 2024-06-10 DIAGNOSIS — R519 Headache, unspecified: Secondary | ICD-10-CM | POA: Insufficient documentation

## 2024-06-10 DIAGNOSIS — Z87891 Personal history of nicotine dependence: Secondary | ICD-10-CM | POA: Diagnosis not present

## 2024-06-10 DIAGNOSIS — Z7901 Long term (current) use of anticoagulants: Secondary | ICD-10-CM | POA: Diagnosis not present

## 2024-06-10 DIAGNOSIS — I10 Essential (primary) hypertension: Secondary | ICD-10-CM | POA: Insufficient documentation

## 2024-06-10 DIAGNOSIS — E876 Hypokalemia: Secondary | ICD-10-CM | POA: Diagnosis not present

## 2024-06-10 DIAGNOSIS — Z79899 Other long term (current) drug therapy: Secondary | ICD-10-CM | POA: Diagnosis not present

## 2024-06-10 DIAGNOSIS — R0602 Shortness of breath: Secondary | ICD-10-CM | POA: Insufficient documentation

## 2024-06-10 LAB — CBC
HCT: 37.4 % (ref 36.0–46.0)
Hemoglobin: 12.1 g/dL (ref 12.0–15.0)
MCH: 30 pg (ref 26.0–34.0)
MCHC: 32.4 g/dL (ref 30.0–36.0)
MCV: 92.8 fL (ref 80.0–100.0)
Platelets: 293 K/uL (ref 150–400)
RBC: 4.03 MIL/uL (ref 3.87–5.11)
RDW: 13.7 % (ref 11.5–15.5)
WBC: 9 K/uL (ref 4.0–10.5)
nRBC: 0 % (ref 0.0–0.2)

## 2024-06-10 LAB — RESP PANEL BY RT-PCR (RSV, FLU A&B, COVID)  RVPGX2
Influenza A by PCR: NEGATIVE
Influenza B by PCR: NEGATIVE
Resp Syncytial Virus by PCR: NEGATIVE
SARS Coronavirus 2 by RT PCR: NEGATIVE

## 2024-06-10 LAB — BASIC METABOLIC PANEL WITH GFR
Anion gap: 12 (ref 5–15)
BUN: 16 mg/dL (ref 8–23)
CO2: 26 mmol/L (ref 22–32)
Calcium: 10 mg/dL (ref 8.9–10.3)
Chloride: 103 mmol/L (ref 98–111)
Creatinine, Ser: 1.18 mg/dL — ABNORMAL HIGH (ref 0.44–1.00)
GFR, Estimated: 51 mL/min — ABNORMAL LOW (ref >60)
Glucose, Bld: 85 mg/dL (ref 70–99)
Potassium: 3.3 mmol/L — ABNORMAL LOW (ref 3.5–5.1)
Sodium: 140 mmol/L (ref 135–145)

## 2024-06-10 LAB — PRO BRAIN NATRIURETIC PEPTIDE: Pro Brain Natriuretic Peptide: <50 pg/mL (ref <300.0)

## 2024-06-10 MED ORDER — IOHEXOL 350 MG/ML SOLN
100.0000 mL | Freq: Once | INTRAVENOUS | Status: AC | PRN
  Administered 2024-06-10: 75 mL via INTRAVENOUS

## 2024-06-10 MED ORDER — ALBUTEROL SULFATE HFA 108 (90 BASE) MCG/ACT IN AERS
2.0000 | INHALATION_SPRAY | Freq: Once | RESPIRATORY_TRACT | Status: AC
  Administered 2024-06-10: 2 via RESPIRATORY_TRACT
  Filled 2024-06-10: qty 6.7

## 2024-06-10 MED ORDER — POTASSIUM CHLORIDE CRYS ER 20 MEQ PO TBCR
20.0000 meq | EXTENDED_RELEASE_TABLET | Freq: Once | ORAL | Status: AC
  Administered 2024-06-10: 20 meq via ORAL
  Filled 2024-06-10: qty 1

## 2024-06-10 MED ORDER — IPRATROPIUM-ALBUTEROL 0.5-2.5 (3) MG/3ML IN SOLN: 3.0000 mL | Freq: Once | RESPIRATORY_TRACT | Status: DC

## 2024-06-10 MED ORDER — POTASSIUM CHLORIDE CRYS ER 10 MEQ PO TBCR: 10.0000 meq | EXTENDED_RELEASE_TABLET | Freq: Every day | ORAL | 0 refills | Status: AC

## 2024-06-10 NOTE — Telephone Encounter (Signed)
 Faxed cardiology surgical clearance form to [facility] [phone]. Aware that we must receive clearance form back before being able to proceed with scheduling surgery.

## 2024-06-10 NOTE — ED Notes (Signed)
 RT at bedside, will give K afterwards.

## 2024-06-10 NOTE — ED Triage Notes (Signed)
 Pt c/o shob and fatigue since thanksgiving. Denies swelling, fever.   Reports she had lidocaine  patch applied to R upper thigh, removed this AM and noticed black spots.   D/c tramadol  1 week ago.

## 2024-06-10 NOTE — Telephone Encounter (Signed)
FYI. Pt going to ED.  

## 2024-06-10 NOTE — ED Provider Notes (Signed)
 [place] [facility] Provider Note   CSN: [id number] Arrival date & time: 06/10/24  1448     Patient presents with: Shortness of Breath   Margaret Sims is a 63 y.o. female w/ hx of PE on Eliquis , HTN, former smoker quit 1 year ago, here with worsening SOB for 1 week, gradual onset, worse at night. Mild headaches. No fevers, coughing, sore throat, sick contacts. Does report fatigue, dyspnea on exertion.  Planning right hip replacement soon, wears lidocaine  patches on her right thigh and noticed discoloration underneath her patch recently.   HPI     Prior to Admission medications   Medication Sig Start Date End Date Taking? Authorizing Provider  potassium chloride  (KLOR-CON  M) 10 MEQ tablet Take 1 tablet (10 mEq total) by mouth daily for 10 days. 06/11/24 06/21/24 Yes Fletcher Ostermiller, Donnice PARAS, MD  traMADol  (ULTRAM ) 50 MG tablet Take 1 tablet (50 mg total) by mouth 2 (two) times daily as needed. 05/24/24   Jule Ronal LITTIE, PA-C  amLODipine  (NORVASC ) 10 MG tablet Take 1 tablet (10 mg total) by mouth daily. 10/06/23   Copland, Harlene BROCKS, MD  apixaban  (ELIQUIS ) 5 MG TABS tablet Take 1 tablet (5 mg total) by mouth 2 (two) times daily. 01/01/24   Tonette Lauraine HERO, PA-C  apixaban  (ELIQUIS ) 5 MG TABS tablet Take 1 tablet (5 mg total) by mouth 2 (two) times daily. 01/02/24   Tonette Lauraine HERO, PA-C  aspirin  81 MG tablet Take 81 mg by mouth daily. Patient not taking: Reported on 04/07/2024    [provider]  Cholecalciferol (VITAMIN D3) 1.25 MG (50000 UT) CAPS Take 1 weekly for 12 weeks 09/04/23   Copland, Harlene BROCKS, MD  cyclobenzaprine  (FLEXERIL ) 10 MG tablet Take 1 tablet (10 mg total) by mouth in the morning and at bedtime. 04/07/24   Copland, Harlene BROCKS, MD  hydrochlorothiazide  (HYDRODIURIL ) 12.5 MG tablet Take 1 tablet (12.5 mg total) by mouth daily. 10/27/23   Copland, Harlene BROCKS, MD  metFORMIN  (GLUCOPHAGE ) 500 MG tablet TAKE 1 TABLET (500 MG TOTAL) BY  MOUTH DAILY. 03/15/24   Copland, Harlene BROCKS, MD  omeprazole  (PRILOSEC) 40 MG capsule Take 1 capsule (40 mg total) by mouth daily. 03/18/24   Copland, Harlene BROCKS, MD  ondansetron  (ZOFRAN -ODT) 4 MG disintegrating tablet Take 1 tablet (4 mg total) by mouth every 6 (six) hours as needed for nausea or vomiting. 11/04/23   Prosperi, Christian H, PA-C  rosuvastatin  (CRESTOR ) 20 MG tablet Take 1 tablet (20 mg total) by mouth daily. 03/09/24   Copland, Harlene BROCKS, MD    Allergies: Lisinopril     Review of Systems  Updated Vital Signs BP 125/87   Pulse 93   Temp (!) 97.5 F (36.4 C) (Oral)   Resp 15   Ht 5' 7 (1.702 m)   Wt 72.1 kg   SpO2 100%   BMI 24.90 kg/m   Physical Exam Constitutional:      General: She is not in acute distress. HENT:     Head: Normocephalic and atraumatic.  Eyes:     Conjunctiva/sclera: Conjunctivae normal.     Pupils: Pupils are equal, round, and reactive to light.  Cardiovascular:     Rate and Rhythm: Regular rhythm. Tachycardia present.  Pulmonary:     Effort: Pulmonary effort is normal. No respiratory distress.     Comments: No hypoxia Abdominal:     General: There is no distension.     Tenderness: There is no abdominal  tenderness.  Musculoskeletal:     Comments: Skin discolored with some bruising in square pattern right anterior thigh near site of prior patch  Skin:    General: Skin is warm and dry.  Neurological:     General: No focal deficit present.     Mental Status: She is alert. Mental status is at baseline.  Psychiatric:        Mood and Affect: Mood normal.        Behavior: Behavior normal.     (all labs ordered are listed, but only abnormal results are displayed) Labs Reviewed  BASIC METABOLIC PANEL WITH GFR - Abnormal; Notable for the following components:      Result Value   Potassium 3.3 (*)    Creatinine, Ser 1.18 (*)    GFR, Estimated 51 (*)    All other components within normal limits  RESP PANEL BY RT-PCR (RSV, FLU A&B, COVID)   RVPGX2  CBC  PRO BRAIN NATRIURETIC PEPTIDE    EKG: EKG Interpretation Date/Time:  Thursday June 10 2024 15:03:39 EST Ventricular Rate:  114 PR Interval:  152 QRS Duration:  72 QT Interval:  310 QTC Calculation: 427 R Axis:   -35  Text Interpretation: Sinus tachycardia Probable left atrial enlargement Abnormal R-wave progression, early transition Confirmed by Cottie Cough [phone]) on 06/10/2024 3:11:49 PM  Radiology: CT Angio Chest PE W and/or Wo Contrast Result Date: 06/10/2024 CLINICAL DATA:  Worsening shortness of breath. Suspect pulmonary embolism. EXAM: CT ANGIOGRAPHY CHEST WITH CONTRAST TECHNIQUE: Multidetector CT imaging of the chest was performed using the standard protocol during bolus administration of intravenous contrast. Multiplanar CT image reconstructions and MIPs were obtained to evaluate the vascular anatomy. RADIATION DOSE REDUCTION: This exam was performed according to the departmental dose-optimization program which includes automated exposure control, adjustment of the mA and/or kV according to patient size and/or use of iterative reconstruction technique. CONTRAST:  75mL OMNIPAQUE  IOHEXOL  350 MG/ML SOLN COMPARISON:  01/22/2024 FINDINGS: Cardiovascular: Borderline stable cardiomegaly. Minimal calcified plaque over the left main and 3 vessel coronary arteries. Mild prominence of the ascending thoracic aorta without evidence of aneurysm. Bovine arch morphology. Mild calcified plaque over the descending thoracic aorta. Pulmonary arterial system is well opacified without evidence of emboli. Mediastinum/Nodes: No mediastinal or hilar adenopathy. Remaining mediastinal structures are unremarkable. Lungs/Pleura: Lungs are adequately inflated. There is mild centrilobular emphysematous disease present. No focal acute airspace process. No effusion. Airways are unremarkable. Upper Abdomen: Images through the upper abdomen demonstrate calcification plaque over the abdominal aorta. No  acute findings. Musculoskeletal: No focal abnormality. Review of the MIP images confirms the above findings. IMPRESSION: 1. No acute cardiopulmonary disease and no evidence of pulmonary embolism. 2. Borderline stable cardiomegaly. Atherosclerotic coronary artery disease. 3. Mild centrilobular emphysematous disease. 4. Aortic atherosclerosis. Aortic Atherosclerosis (ICD10-I70.0) and Emphysema (ICD10-J43.9). Electronically Signed   By: Toribio Agreste M.D.   On: 06/10/2024 17:06   XR HIP UNILAT W OR W/O PELVIS 2-3 VIEWS RIGHT Result Date: 06/09/2024 X-rays of the right hip show advanced degenerative joint disease with bone-on-bone joint space.    Procedures   Medications Ordered in the ED  potassium chloride  SA (KLOR-CON  M) CR tablet 20 mEq (has no administration in time range)  ipratropium-albuterol  (DUONEB) 0.5-2.5 (3) MG/3ML nebulizer solution 3 mL (has no administration in time range)  albuterol  (VENTOLIN  HFA) 108 (90 Base) MCG/ACT inhaler 2 puff (has no administration in time range)  iohexol  (OMNIPAQUE ) 350 MG/ML injection 100 mL (75 mLs Intravenous Contrast Given 06/10/24 1613)  Clinical Course as of 06/10/24 1805  Thu Jun 10, 2024  1738 HR 87 bpm and regular.  CT PE unremarkable. [MT]    Clinical Course User Index [MT] Claire Bridge, Donnice PARAS, MD                                 Medical Decision Making Amount and/or Complexity of Data Reviewed Labs: ordered. Radiology: ordered.  Risk Prescription drug management.   This patient presents to the ED with concern for SOB. This involves an extensive number of treatment options, and is a complaint that carries with it a high risk of complications and morbidity.  The differential diagnosis includes PE vs PNA vs PTX vs viral uri including covid/flu vs CHF vs other  Co-morbidities that complicate the patient evaluation: hx of prior PE  I ordered and personally interpreted labs.  The pertinent results include:  no emergent findings  I  ordered imaging studies including CT PE study I independently visualized and interpreted imaging which showed no emergent findings, emphysematous changes I agree with the radiologist interpretation  The patient was maintained on a cardiac monitor.  I personally viewed and interpreted the cardiac monitored which showed an underlying rhythm of: mild borderline sinus tachycardia HR 100-105 bpm average  Per my interpretation the patient's ECG shows sinus tachycardia  I ordered medication including DuoNeb for potential emphysematous shortness of breath, potassium for mild hypokalemia  I have reviewed the patients home medicines and have made adjustments as needed  Test Considered: Low suspicion for sepsis, ACS, acute intra-abdominal pathology   After the interventions noted above, I reevaluated the patient and found that they have: improved    Dispostion:  After consideration of the diagnostic results and the patients response to treatment, I feel that the patent would benefit from close outpatient follow-up.      Final diagnoses:  Shortness of breath  Hypokalemia    ED Discharge Orders          Ordered    potassium chloride  (KLOR-CON  M) 10 MEQ tablet  Daily        06/10/24 1804               Cottie Donnice PARAS, MD 06/10/24 [phone]

## 2024-06-10 NOTE — Telephone Encounter (Signed)
 FYI Only or Action Required?: FYI only for provider: ED advised.  Patient was last seen in primary care on 04/07/2024 by Copland, Harlene BROCKS, MD.  Called Nurse Triage reporting Shortness of Breath.  Symptoms began several days ago.  Interventions attempted: Rest, hydration, or home remedies.  Symptoms are: unchanged.  Triage Disposition: Go to ED Now (Notify PCP)  Patient/caregiver understands and will follow disposition?: Yes  Copied from CRM [id number]. Topic: Clinical - Red Word Triage >> Jun 10, 2024  1:45 PM Avram MATSU wrote: Red Word that prompted transfer to Nurse Triage: patient put on a pain patch and stated she has black bumps on that site and sob. Reason for Disposition  History of prior blood clot in leg or lungs (i.e., deep vein thrombosis, pulmonary embolism)  Answer Assessment - Initial Assessment Questions ED advised, patient in agreement   1. RESPIRATORY STATUS: Describe your breathing? (e.g., wheezing, shortness of breath, unable to speak, severe coughing)      shortness of breath  2. ONSET: When did this breathing problem begin?      Few days 3. PATTERN Does the difficult breathing come and go, or has it been constant since it started?      Intermittent  4. SEVERITY: How bad is your breathing? (e.g., mild, moderate, severe)      Mild 5. RECURRENT SYMPTOM: Have you had difficulty breathing before? If Yes, ask: When was the last time? and What happened that time?      April when she had PE-on Eliquis .  6. CARDIAC HISTORY: Do you have any history of heart disease? (e.g., heart attack, angina, bypass surgery, angioplasty)      htn 7. LUNG HISTORY: Do you have any history of lung disease?  (e.g., pulmonary embolus, asthma, emphysema)     PE  8. CAUSE: What do you think is causing the breathing problem?      pain 9. OTHER SYMPTOMS: Do you have any other symptoms? (e.g., chest pain, cough, dizziness, fever, runny nose)     Skin irritation from  lidocaine  patch  10. O2 SATURATION MONITOR:  Do you use an oxygen saturation monitor (pulse oximeter) at home? If Yes, ask: What is your reading (oxygen level) today? What is your usual oxygen saturation reading? (e.g., 95%)        11. PREGNANCY: Is there any chance you are pregnant? When was your last menstrual period?        12. TRAVEL: Have you traveled out of the country in the last month? (e.g., travel history, exposures)  Protocols used: Breathing Difficulty-A-AH

## 2024-06-10 NOTE — ED Notes (Signed)

## 2024-06-10 NOTE — ED Notes (Signed)
 SOB for about a week, associated with thanksgiving and fatigue from that.

## 2024-06-11 ENCOUNTER — Telehealth: Payer: Self-pay | Admitting: *Deleted

## 2024-06-11 NOTE — Telephone Encounter (Signed)
   Pre-operative Risk Assessment    Patient Name: Margaret Sims  DOB: November 04, 1960 MRN: [id number]      Request for Surgical Clearance    Procedure:  Right Total Hip Arthroplasty  Date of Surgery:  Clearance TBD                                 Surgeon:  Kay Ozell Cummins, MD Surgeon's Group or Practice Name:  [facility] number:  [phone] Fax number:  [phone]   Type of Clearance Requested:   - Medical  - Pharmacy:  Hold Aspirin  and Apixaban  (Eliquis ) hold per your instructions   Type of Anesthesia:  Not Indicated   Additional requests/questions:  Who would you suggest for consultation at the hospital?  SignedArloa Donovan Dines   06/11/2024, 4:27 PM

## 2024-06-14 ENCOUNTER — Encounter: Payer: Self-pay | Admitting: Family Medicine

## 2024-06-14 NOTE — Telephone Encounter (Signed)
 Primary Cardiologist:None  Chart reviewed as part of pre-operative protocol coverage. Because of Margaret Sims's past medical history and time since last visit, he/she will require a follow-up visit in order to better assess preoperative cardiovascular risk.  Pre-op covering staff: - Please schedule appointment and call patient to inform them. (Last seen 11/2022) - Please contact requesting surgeon's office via preferred method (i.e, phone, fax) to inform them of need for appointment prior to surgery.  This message will also be routed to pharmacy pool for input on holding anticoagulant agent as requested below so that this information is available at time of patient's appointment.   Margaret EMERSON Bane, NP-C  06/14/2024, 7:22 AM [address], [address] [place], [place] [address] Office [phone] Fax [phone]

## 2024-06-14 NOTE — Telephone Encounter (Signed)
 Patient has been scheduled for tomorrow in our [facility] location patient is planning on getting surgery done on Jan patient stated she prefers [facility] location

## 2024-06-15 ENCOUNTER — Ambulatory Visit: Attending: Cardiology | Admitting: Cardiology

## 2024-06-15 ENCOUNTER — Encounter: Payer: Self-pay | Admitting: Cardiology

## 2024-06-15 ENCOUNTER — Other Ambulatory Visit: Payer: Self-pay | Admitting: Cardiology

## 2024-06-15 VITALS — BP 100/80 | HR 104 | Ht 67.0 in | Wt 162.0 lb

## 2024-06-15 DIAGNOSIS — Z86711 Personal history of pulmonary embolism: Secondary | ICD-10-CM | POA: Insufficient documentation

## 2024-06-15 DIAGNOSIS — F17200 Nicotine dependence, unspecified, uncomplicated: Secondary | ICD-10-CM

## 2024-06-15 DIAGNOSIS — R0609 Other forms of dyspnea: Secondary | ICD-10-CM

## 2024-06-15 DIAGNOSIS — E1140 Type 2 diabetes mellitus with diabetic neuropathy, unspecified: Secondary | ICD-10-CM

## 2024-06-15 DIAGNOSIS — I2510 Atherosclerotic heart disease of native coronary artery without angina pectoris: Secondary | ICD-10-CM

## 2024-06-15 DIAGNOSIS — I472 Ventricular tachycardia, unspecified: Secondary | ICD-10-CM

## 2024-06-15 DIAGNOSIS — E7800 Pure hypercholesterolemia, unspecified: Secondary | ICD-10-CM

## 2024-06-15 DIAGNOSIS — I10 Essential (primary) hypertension: Secondary | ICD-10-CM

## 2024-06-15 NOTE — Addendum Note (Signed)
 Addended by: ARLOA PLANAS D on: 06/15/2024 08:33 AM   Modules accepted: Orders

## 2024-06-15 NOTE — Patient Instructions (Addendum)
 Medication Instructions:  Your physician recommends that you continue on your current medications as directed. Please refer to the Current Medication list given to you today.  *If you need a refill on your cardiac medications before your next appointment, please call your pharmacy*   Lab Work: 3rd Floor   [address]  Your physician recommends that you return for lab work in: when fasting   You need to have labs done when you are fasting.  You can come Monday through Friday 8:00 am to 11:30AM and 1:00 to 4:00. You do not need to make an appointment as the order has already been placed.    Testing/Procedures: Your physician has requested that you have a lexiscan myoview . For further information please visit [email or web address] Please follow instruction sheet, as given.  The test will take approximately 3 to 4 hours to complete; you may bring reading material.  If someone comes with you to your appointment, they will need to remain in the main lobby due to limited space in the testing area. **If you are pregnant or breastfeeding, please notify the nuclear lab prior to your appointment**  How to prepare for your Myocardial Perfusion Test: Do not eat or drink 3 hours prior to your test, except you may have water. Do not consume products containing caffeine (regular or decaffeinated) 12 hours prior to your test. (ex: coffee, chocolate, sodas, tea). Do bring a list of your current medications with you.  If not listed below, you may take your medications as normal. Do wear comfortable clothes (no dresses or overalls) and walking shoes, tennis shoes preferred (No heels or open toe shoes are allowed). Do NOT wear cologne, perfume, aftershave, or lotions (deodorant is allowed). If these instructions are not followed, your test will have to be rescheduled.  Your physician has requested that you have an echocardiogram. Echocardiography is a painless test that uses sound waves to create images of your  heart. It provides your doctor with information about the size and shape of your heart and how well your heart's chambers and valves are working. This procedure takes approximately one hour. There are no restrictions for this procedure.   Follow-Up: At [facility], you and your health needs are our priority.  As part of our continuing mission to provide you with exceptional heart care, we have created designated Provider Care Teams.  These Care Teams include your primary Cardiologist (physician) and Advanced Practice Providers (APPs -  Physician Assistants and Nurse Practitioners) who all work together to provide you with the care you need, when you need it.  We recommend signing up for the patient portal called MyChart.  Sign up information is provided on this After Visit Summary.  MyChart is used to connect with patients for Virtual Visits (Telemedicine).  Patients are able to view lab/test results, encounter notes, upcoming appointments, etc.  Non-urgent messages can be sent to your provider as well.   To learn more about what you can do with MyChart, go to [email or web address].    Your next appointment:   6 month(s)  The format for your next appointment:   In Person  Provider:   Lamar Fitch, MD   Other Instructions Cardiac Nuclear Scan A cardiac nuclear scan is a test that is done to check the flow of blood to your heart. It is done when you are resting and when you are exercising. The test looks for problems such as: Not enough blood reaching a portion of the heart. The heart muscle not  working as it should. You may need this test if: You have heart disease. You have had lab results that are not normal. You have had heart surgery or a balloon procedure to open up blocked arteries (angioplasty). You have chest pain. You have shortness of breath. In this test, a special dye (tracer) is put into your bloodstream. The tracer will travel to your heart. A camera will then  take pictures of your heart to see how the tracer moves through your heart. This test is usually done at a hospital and takes 2-4 hours. Tell a doctor about: Any allergies you have. All medicines you are taking, including vitamins, herbs, eye drops, creams, and over-the-counter medicines. Any problems you or family members have had with anesthetic medicines. Any blood disorders you have. Any surgeries you have had. Any medical conditions you have. Whether you are pregnant or may be pregnant. What are the risks? Generally, this is a safe test. However, problems may occur, such as: Serious chest pain and heart attack. This is only a risk if the stress portion of the test is done. Rapid heartbeat. A feeling of warmth in your chest. This feeling usually does not last long. Allergic reaction to the tracer. What happens before the test? Ask your doctor about changing or stopping your normal medicines. This is important. Follow instructions from your doctor about what you cannot eat or drink. Remove your jewelry on the day of the test. What happens during the test? An IV tube will be inserted into one of your veins. Your doctor will give you a small amount of tracer through the IV tube. You will wait for 20-40 minutes while the tracer moves through your bloodstream. Your heart will be monitored with an electrocardiogram (ECG). You will lie down on an exam table. Pictures of your heart will be taken for about 15-20 minutes. You may also have a stress test. For this test, one of these things may be done: You will be asked to exercise on a treadmill or a stationary bike. You will be given medicines that will make your heart work harder. This is done if you are unable to exercise. When blood flow to your heart has peaked, a tracer will again be given through the IV tube. After 20-40 minutes, you will get back on the exam table. More pictures will be taken of your heart. Depending on the tracer  that is used, more pictures may need to be taken 3-4 hours later. Your IV tube will be removed when the test is over. The test may vary among doctors and hospitals. What happens after the test? Ask your doctor: Whether you can return to your normal schedule, including diet, activities, and medicines. Whether you should drink more fluids. This will help to remove the tracer from your body. Drink enough fluid to keep your pee (urine) pale yellow. Ask your doctor, or the department that is doing the test: When will my results be ready? How will I get my results? Summary A cardiac nuclear scan is a test that is done to check the flow of blood to your heart. Tell your doctor whether you are pregnant or may be pregnant. Before the test, ask your doctor about changing or stopping your normal medicines. This is important. Ask your doctor whether you can return to your normal activities. You may be asked to drink more fluids. This information is not intended to replace advice given to you by your health care provider. Make sure you  discuss any questions you have with your health care provider. Document Revised: 10/14/2018 Document Reviewed: 12/08/2017 Elsevier Patient Education  2021 Elsevier Inc.    Echocardiogram An echocardiogram is a test that uses sound waves (ultrasound) to produce images of the heart. Images from an echocardiogram can provide important information about: Heart size and shape. The size and thickness and movement of your heart's walls. Heart muscle function and strength. Heart valve function or if you have stenosis. Stenosis is when the heart valves are too narrow. If blood is flowing backward through the heart valves (regurgitation). A tumor or infectious growth around the heart valves. Areas of heart muscle that are not working well because of poor blood flow or injury from a heart attack. Aneurysm detection. An aneurysm is a weak or damaged part of an artery wall. The  wall bulges out from the normal force of blood pumping through the body. Tell a health care provider about: Any allergies you have. All medicines you are taking, including vitamins, herbs, eye drops, creams, and over-the-counter medicines. Any blood disorders you have. Any surgeries you have had. Any medical conditions you have. Whether you are pregnant or may be pregnant. What are the risks? Generally, this is a safe test. However, problems may occur, including an allergic reaction to dye (contrast) that may be used during the test. What happens before the test? No specific preparation is needed. You may eat and drink normally. What happens during the test? You will take off your clothes from the waist up and put on a hospital gown. Electrodes or electrocardiogram (ECG)patches may be placed on your chest. The electrodes or patches are then connected to a device that monitors your heart rate and rhythm. You will lie down on a table for an ultrasound exam. A gel will be applied to your chest to help sound waves pass through your skin. A handheld device, called a transducer, will be pressed against your chest and moved over your heart. The transducer produces sound waves that travel to your heart and bounce back (or echo back) to the transducer. These sound waves will be captured in real-time and changed into images of your heart that can be viewed on a video monitor. The images will be recorded on a computer and reviewed by your health care provider. You may be asked to change positions or hold your breath for a short time. This makes it easier to get different views or better views of your heart. In some cases, you may receive contrast through an IV in one of your veins. This can improve the quality of the pictures from your heart. The procedure may vary among health care providers and hospitals.    What can I expect after the test? You may return to your normal, everyday life, including  diet, activities, and medicines, unless your health care provider tells you not to do that. Follow these instructions at home: It is up to you to get the results of your test. Ask your health care provider, or the department that is doing the test, when your results will be ready. Keep all follow-up visits. This is important. Summary An echocardiogram is a test that uses sound waves (ultrasound) to produce images of the heart. Images from an echocardiogram can provide important information about the size and shape of your heart, heart muscle function, heart valve function, and other possible heart problems. You do not need to do anything to prepare before this test. You may eat and drink  normally. After the echocardiogram is completed, you may return to your normal, everyday life, unless your health care provider tells you not to do that. This information is not intended to replace advice given to you by your health care provider. Make sure you discuss any questions you have with your health care provider. Document Revised: 02/15/2020 Document Reviewed: 02/15/2020 Elsevier Patient Education  2021 [employer].

## 2024-06-15 NOTE — Progress Notes (Signed)
 Cardiology Office Note:    Date:  06/15/2024   ID:  Margaret Sims, DOB February 12, 1961, MRN [id number]  PCP:  Watt Harlene BROCKS, MD  Cardiologist:  Lamar Fitch, MD    Referring MD: Watt Harlene BROCKS, MD   Chief Complaint  Patient presents with   Pre-op Exam    History of Present Illness:    Margaret Sims is a 63 y.o. female past medical history significant for essential hypertension, diabetes, dyslipidemia, I did see her in 2023 when she was referred to us  for episode of dizziness.  Monitor shows some runs of nonsustained ventricular tachycardia, stratification thereafter include echocardiogram showing preserved ejection fraction, stress test being normal she was also seen by EP team and conclusion was that she is at low risk.  This time however she is coming to me because she required hip surgery.  She did have pulmonary emboli that she suffered from in April and it felt to be secondary to the fact she travel long time she still anticoagulated.  Since that time she is doing fine but she does complain of an shortness of breath.  In the matter-of-fact at the beginning of this month she ended up in the emergency room with shortness of breath she was told this is because of low potassium?SABRA  Overall she denies have any chest pain tightness squeezing pressure burning chest but obviously her ability to exercise is limited because of hip pain.  Her surgery apparently scheduled the end of January.  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 IMPRESSION: Colonoscopy reveals multiple areas of diverticulosis. Will treat for suspected diverticulitis. RTO if sxs worsen or do not improve. gwt 03/05/2012   Dyslipidemia 02/19/2016   Encounter for gynecological examination 12/07/2018   Essential hypertension 02/19/2016   High cholesterol     Hypercholesterolemia 06/23/2013   Formatting of this note might be different from the original. IMPRESSION: The goal is to have your total cholesterol < 200, the HDL (good cholesterol) >40, and the LDL (bad cholesterol) <100.  And triglycerides should be under 150.`E1o3L`It is recommended that you follow a good diet low in animal and dairy fat and exercise for at least 45 minutes a day 5-6 days a week. `E1o3L`*LDL still high at 14   Obesity 12/07/2018   Syncope and collapse 01/17/2022   Tobacco use disorder 02/19/2016   Type 2 diabetes mellitus with diabetic neuropathy, unspecified (HCC) 05/01/2020   Ventricular tachycardia (HCC) 02/21/2022    Past Surgical History:  Procedure Laterality Date   CESAREAN SECTION     COLONOSCOPY  2014   normal    Current Medications: Current Meds  Medication Sig   amLODipine  (NORVASC ) 10 MG tablet Take 1 tablet (10 mg total) by mouth daily.   apixaban  (ELIQUIS ) 5 MG TABS tablet Take 1 tablet (5 mg total) by mouth 2 (two) times daily.   aspirin  81 MG tablet Take 81 mg by mouth daily.   Cholecalciferol (VITAMIN D3) 1.25 MG (50000 UT) CAPS Take 1 weekly for 12 weeks   hydrochlorothiazide  (HYDRODIURIL ) 12.5 MG tablet Take 1 tablet (12.5 mg total) by mouth daily.   metFORMIN  (GLUCOPHAGE ) 500 MG tablet TAKE 1 TABLET (500 MG TOTAL) BY MOUTH DAILY.   omeprazole  (PRILOSEC) 40 MG capsule Take 1 capsule (40 mg total) by mouth daily.   ondansetron  (ZOFRAN -ODT)  4 MG disintegrating tablet Take 1 tablet (4 mg total) by mouth every 6 (six) hours as needed for nausea or vomiting.   potassium chloride  (KLOR-CON  M) 10 MEQ tablet Take 1 tablet (10 mEq total) by mouth daily for 10 days.   rosuvastatin  (CRESTOR ) 20 MG tablet Take 1 tablet (20 mg total) by mouth daily.     Allergies:   Lisinopril    Social History   Socioeconomic History   Marital status: Married    Spouse name: Not on file   Number of children: Not on file   Years of education: Not on file   Highest  education level: 12th grade  Occupational History   Not on file  Tobacco Use   Smoking status: Former    Current packs/day: 0.00    Types: Cigarettes    Quit date: 09/05/2021    Years since quitting: 2.7   Smokeless tobacco: Never   Tobacco comments:    11/13/23: Has not had a cigarette in a month.  Vaping Use   Vaping status: Never Used  Substance and Sexual Activity   Alcohol use: Yes    Comment: 1-2 times per week   Drug use: Never   Sexual activity: Yes    Birth control/protection: Post-menopausal  Other Topics Concern   Not on file  Social History Narrative   ** Merged History Encounter **       Right handed   Social Drivers of Health   Financial Resource Strain: Low Risk  (01/20/2024)   Overall Financial Resource Strain (CARDIA)    Difficulty of Paying Living Expenses: Not hard at all  Food Insecurity: No Food Insecurity (01/20/2024)   Hunger Vital Sign    Worried About Running Out of Food in the Last Year: Never true    Ran Out of Food in the Last Year: Never true  Transportation Needs: No Transportation Needs (01/20/2024)   PRAPARE - Administrator, Civil Service (Medical): No    Lack of Transportation (Non-Medical): No  Physical Activity: Sufficiently Active (01/20/2024)   Exercise Vital Sign    Days of Exercise per Week: 3 days    Minutes of Exercise per Session: 50 min  Stress: Stress Concern Present (01/20/2024)   [employer] of Occupational Health - Occupational Stress Questionnaire    Feeling of Stress: Rather much  Social Connections: Socially Integrated (01/20/2024)   Social Connection and Isolation Panel    Frequency of Communication with Friends and Family: More than three times a week    Frequency of Social Gatherings with Friends and Family: Once a week    Attends Religious Services: More than 4 times per year    Active Member of [employer] or Organizations: Yes    Attends Engineer, Structural: More than 4 times per year    Marital  Status: Married     Family History: The patient's family history includes Stroke in her maternal grandmother. There is no history of Colon cancer, Colon polyps, Esophageal cancer, Rectal cancer, or Stomach cancer. ROS:   Please see the history of present illness.    All 14 point review of systems negative except as described per history of present illness  EKGs/Labs/Other Studies Reviewed:         Recent Labs: 09/03/2023: TSH 0.78 11/11/2023: B Natriuretic Peptide 7.0 11/21/2023: Magnesium  2.0 01/01/2024: ALT 17 06/10/2024: BUN 16; Creatinine, Ser 1.18; Hemoglobin 12.1; Platelets 293; Potassium 3.3; Pro Brain Natriuretic Peptide <50.0; Sodium 140  Recent Lipid Panel  Component Value Date/Time   CHOL 194 03/03/2024 1421   TRIG 156.0 (H) 03/03/2024 1421   HDL 53.10 03/03/2024 1421   CHOLHDL 4 03/03/2024 1421   VLDL 31.2 03/03/2024 1421   LDLCALC 110 (H) 03/03/2024 1421    Physical Exam:    VS:  BP 100/80   Pulse (!) 104   Ht 5' 7 (1.702 m)   Wt 162 lb (73.5 kg)   BMI 25.37 kg/m     Wt Readings from Last 3 Encounters:  06/15/24 162 lb (73.5 kg)  06/10/24 159 lb (72.1 kg)  04/07/24 161 lb 6.4 oz (73.2 kg)     GEN:  Well nourished, well developed in no acute distress HEENT: Normal NECK: No JVD; No carotid bruits LYMPHATICS: No lymphadenopathy CARDIAC: RRR, no murmurs, no rubs, no gallops RESPIRATORY:  Clear to auscultation without rales, wheezing or rhonchi  ABDOMEN: Soft, non-tender, non-distended MUSCULOSKELETAL:  No edema; No deformity  SKIN: Warm and dry LOWER EXTREMITIES: no swelling NEUROLOGIC:  Alert and oriented x 3 PSYCHIATRIC:  Normal affect   ASSESSMENT:    1. Essential hypertension   2. Coronary artery disease involving native coronary artery of native heart without angina pectoris   3. Ventricular tachycardia (HCC)   4. Type 2 diabetes mellitus with diabetic neuropathy, without long-term current use of insulin (HCC)   5. Hypercholesterolemia   6.  Tobacco use disorder   7. History of pulmonary embolism    PLAN:    In order of problems listed above:  Cardiovascular evaluation before elective hip replacement surgery this lady with numerous risk factors for coronary artery disease.  Her ability to do exercise is limited because of hip pain.  She is unable to do 4 METS.  Therefore she need to be evaluated for potential ischemia.  Will schedule her to have a stress test.  Because of shortness of breath and that recently brought her to the emergency room I will schedule her also to have echocardiogram to assess left ventricular ejection fraction. History of pulmonary emboli anticoagulated which we will continue.  My understanding is that reasoning for pulm emboli is long trips and plans in the future will be discontinue her anticoagulation.  Will do echocardiogram make sure that right ventricle is functioning properly. Dyslipidemia I did review KPN which show me her LDL of 110 HDL 53 after that however her Crestor  has been increased, will check fasting lipid profile today. History of tobacco abuse likely she quit congratulated her and encouraged her to stay away from it. Diabetes follow-up by internal medicine team, last hemoglobin C 6.7 she understand she need to do a little bit better hopefully after hip surgery she will be able to do more physical exercises.   Medication Adjustments/Labs and Tests Ordered: Current medicines are reviewed at length with the patient today.  Concerns regarding medicines are outlined above.  Orders Placed This Encounter  Procedures   EKG 12-Lead   Medication changes: No orders of the defined types were placed in this encounter.   Signed, Lamar DOROTHA Fitch, MD, [facility] 06/15/2024 8:25 AM    [facility]

## 2024-06-16 ENCOUNTER — Telehealth (HOSPITAL_COMMUNITY): Payer: Self-pay | Admitting: *Deleted

## 2024-06-16 NOTE — Telephone Encounter (Signed)
 Patient given detailed instructions per Myocardial Perfusion Study Information Sheet for the test on 06/21/24 Patient notified to arrive 15 minutes early and that it is imperative to arrive on time for appointment to keep from having the test rescheduled.  If you need to cancel or reschedule your appointment, please call the office within 24 hours of your appointment. . Patient verbalized understanding.Claudene Ronal Quale, RN

## 2024-06-18 NOTE — Telephone Encounter (Signed)
 DOAC is prescribed by PCP for hx of PE. She has upcoming appointment with PCP on 06/24/2024

## 2024-06-19 LAB — LIPID PANEL
Chol/HDL Ratio: 3.6 ratio (ref 0.0–4.4)
Cholesterol, Total: 184 mg/dL (ref 100–199)
HDL: 51 mg/dL (ref >39)
LDL Chol Calc (NIH): 110 mg/dL — ABNORMAL HIGH (ref 0–99)
Triglycerides: 129 mg/dL (ref 0–149)
VLDL Cholesterol Cal: 23 mg/dL (ref 5–40)

## 2024-06-19 LAB — ALT: ALT: 11 IU/L (ref 0–32)

## 2024-06-19 LAB — AST: AST: 21 IU/L (ref 0–40)

## 2024-06-21 ENCOUNTER — Inpatient Hospital Stay (HOSPITAL_COMMUNITY)
Admission: RE | Admit: 2024-06-21 | Discharge: 2024-06-21 | Disposition: A | Source: Ambulatory Visit | Attending: Cardiology | Admitting: Cardiology

## 2024-06-21 ENCOUNTER — Ambulatory Visit: Payer: Self-pay | Admitting: Cardiology

## 2024-06-21 DIAGNOSIS — R0609 Other forms of dyspnea: Secondary | ICD-10-CM | POA: Diagnosis present

## 2024-06-21 DIAGNOSIS — E7800 Pure hypercholesterolemia, unspecified: Secondary | ICD-10-CM

## 2024-06-21 LAB — MYOCARDIAL PERFUSION IMAGING
LV dias vol: 69 mL (ref 46–106)
LV sys vol: 26 mL (ref 3.8–5.2)
Nuc Stress EF: 62 %
Peak HR: 112 {beats}/min
Rest HR: 85 {beats}/min
Rest Nuclear Isotope Dose: 10.9 mCi
SDS: 0
SRS: 0
SSS: 0
ST Depression (mm): 0 mm
Stress Nuclear Isotope Dose: 32 mCi
TID: 0.85

## 2024-06-21 MED ORDER — TECHNETIUM TC 99M TETROFOSMIN IV KIT
10.9000 | PACK | Freq: Once | INTRAVENOUS | Status: AC | PRN
  Administered 2024-06-21: 10:00:00 10.9 via INTRAVENOUS

## 2024-06-21 MED ORDER — REGADENOSON 0.4 MG/5ML IV SOLN
0.4000 mg | Freq: Once | INTRAVENOUS | Status: AC
  Administered 2024-06-21: 12:00:00 0.4 mg via INTRAVENOUS

## 2024-06-21 MED ORDER — REGADENOSON 0.4 MG/5ML IV SOLN
INTRAVENOUS | Status: AC
  Filled 2024-06-21: qty 5

## 2024-06-21 MED ORDER — TECHNETIUM TC 99M TETROFOSMIN IV KIT
32.0000 | PACK | Freq: Once | INTRAVENOUS | Status: AC | PRN
  Administered 2024-06-21: 12:00:00 32 via INTRAVENOUS

## 2024-06-22 ENCOUNTER — Telehealth: Payer: Self-pay

## 2024-06-22 MED ORDER — ROSUVASTATIN CALCIUM 40 MG PO TABS: 40.0000 mg | ORAL_TABLET | Freq: Every day | ORAL | 3 refills | Status: DC

## 2024-06-22 NOTE — Progress Notes (Unsigned)
 [facility] [address], [address] [place], [place] [address] [phone]  Date:  06/24/2024   Name:  Margaret Sims   DOB:  1961/02/23   MRN:  [id number]  PCP:  Watt Harlene BROCKS, MD    Chief Complaint: No chief complaint on file.   History of Present Illness:  Margaret Sims is a 63 y.o. very pleasant female patient who presents with the following:  Patient seen today for preoperative visit-she plans to have a total hip replacement She was in the ER with shortness of breath on 12/4; she was evaluated and released to home  In the interim she was seen by cardiology on 12/9 Cardiovascular evaluation before elective hip replacement surgery this lady with numerous risk factors for coronary artery disease.  Her ability to do exercise is limited because of hip pain.  She is unable to do 4 METS.  Therefore she need to be evaluated for potential ischemia.  Will schedule her to have a stress test.  Because of shortness of breath and that recently brought her to the emergency room I will schedule her also to have echocardiogram to assess left ventricular ejection fraction. History of pulmonary emboli anticoagulated which we will continue.  My understanding is that reasoning for pulm emboli is long trips and plans in the future will be discontinue her anticoagulation.  Will do echocardiogram make sure that right ventricle is functioning properly. Dyslipidemia I did review KPN which show me her LDL of 110 HDL 53 after that however her Crestor  has been increased, will check fasting lipid profile today. History of tobacco abuse likely she quit congratulated her and encouraged her to stay away from it. Diabetes follow-up by internal medicine team, last hemoglobin C 6.7 she understand she need to do a little bit better hopefully after hip surgery she will be able to do more physical exercises.   She did do a Myoview  on 10/15, await result  She  is also currently anticoagulated due to history of pulmonary embolism.  She does not have a provoked PE, anticoagulation planned for a year and then hopefully can decrease to aspirin   Discussed the use of AI scribe software for clinical note transcription with the patient, who gave verbal consent to proceed.  History of Present Illness     Patient Active Problem List   Diagnosis Date Noted   History of pulmonary embolism 06/15/2024   Primary osteoarthritis of right hip 06/09/2024   Chronic right shoulder pain 05/18/2024   COVID-19 01/27/2023   Abdominal bruit 11/12/2022   Coronary artery disease 11/12/2022   Acid reflux 11/01/2022   High cholesterol 11/01/2022   Ventricular tachycardia (HCC) 02/21/2022   Syncope and collapse 01/17/2022   Type 2 diabetes mellitus with diabetic neuropathy, unspecified (HCC) 05/01/2020   Adhesive capsulitis of left shoulder 12/07/2018   Encounter for gynecological examination 12/07/2018   Obesity 12/07/2018   Essential hypertension 02/19/2016   Dyslipidemia 02/19/2016   Tobacco use disorder 02/19/2016   Hypercholesterolemia 06/23/2013   Diverticulitis of colon 03/05/2012    Past Medical History:  Diagnosis Date   Acid reflux    Adhesive capsulitis of left shoulder 12/07/2018   Diverticulitis of colon 03/05/2012   Formatting of this note might be different from the original. IMPRESSION: Colonoscopy reveals multiple areas of diverticulosis. Will treat for suspected diverticulitis. RTO if sxs worsen or do not improve. gwt 03/05/2012   Dyslipidemia 02/19/2016   Encounter for  gynecological examination 12/07/2018   Essential hypertension 02/19/2016   High cholesterol    Hypercholesterolemia 06/23/2013   Formatting of this note might be different from the original. IMPRESSION: The goal is to have your total cholesterol < 200, the HDL (good cholesterol) >40, and the LDL (bad cholesterol) <100.  And triglycerides should be under 150.`E1o3L`It is  recommended that you follow a good diet low in animal and dairy fat and exercise for at least 45 minutes a day 5-6 days a week. `E1o3L`*LDL still high at 14   Obesity 12/07/2018   Syncope and collapse 01/17/2022   Tobacco use disorder 02/19/2016   Type 2 diabetes mellitus with diabetic neuropathy, unspecified (HCC) 05/01/2020   Ventricular tachycardia (HCC) 02/21/2022    Past Surgical History:  Procedure Laterality Date   CESAREAN SECTION     COLONOSCOPY  2014   normal    Social History[1]  Family History  Problem Relation Age of Onset   Stroke Maternal Grandmother    Colon cancer Neg Hx    Colon polyps Neg Hx    Esophageal cancer Neg Hx    Rectal cancer Neg Hx    Stomach cancer Neg Hx     Allergies[2]  Medication list has been reviewed and updated.  Medications Ordered Prior to Encounter[3]  Review of Systems:  As per HPI- otherwise negative.   Physical Examination: There were no vitals filed for this visit. There were no vitals filed for this visit. There is no height or weight on file to calculate BMI. Ideal Body Weight:    GEN: no acute distress. HEENT: Atraumatic, Normocephalic.  Ears and Nose: No external deformity. CV: RRR, No M/G/R. No JVD. No thrill. No extra heart sounds. PULM: CTA B, no wheezes, crackles, rhonchi. No retractions. No resp. distress. No accessory muscle use. ABD: S, NT, ND, +BS. No rebound. No HSM. EXTR: No c/c/e PSYCH: Normally interactive. Conversant.    Assessment and Plan: No diagnosis found.  Assessment & Plan   Signed Harlene Schroeder, MD    [1]  Social History Tobacco Use   Smoking status: Former    Current packs/day: 0.00    Average packs/day: 0.5 packs/day    Types: Cigarettes    Quit date: 09/05/2021    Years since quitting: 2.7   Smokeless tobacco: Never   Tobacco comments:    11/13/23: Has not had a cigarette in a month.  Vaping Use   Vaping status: Never Used  Substance Use Topics   Alcohol use: Yes     Comment: 1-2 times per week   Drug use: Never  [2]  Allergies Allergen Reactions   Lisinopril  Swelling    Angioedema noted 09/2023. Left side of tongue.  [3]  Current Outpatient Medications on File Prior to Visit  Medication Sig Dispense Refill   amLODipine  (NORVASC ) 10 MG tablet Take 1 tablet (10 mg total) by mouth daily. 90 tablet 3   apixaban  (ELIQUIS ) 5 MG TABS tablet Take 1 tablet (5 mg total) by mouth 2 (two) times daily. 60 tablet 0   aspirin  81 MG tablet Take 81 mg by mouth daily.     Cholecalciferol (VITAMIN D3) 1.25 MG (50000 UT) CAPS Take 1 weekly for 12 weeks 12 capsule 0   hydrochlorothiazide  (HYDRODIURIL ) 12.5 MG tablet Take 1 tablet (12.5 mg total) by mouth daily. 90 tablet 3   metFORMIN  (GLUCOPHAGE ) 500 MG tablet TAKE 1 TABLET (500 MG TOTAL) BY MOUTH DAILY. 90 tablet 3   omeprazole  (PRILOSEC) 40 MG capsule  Take 1 capsule (40 mg total) by mouth daily. 90 capsule 2   ondansetron  (ZOFRAN -ODT) 4 MG disintegrating tablet Take 1 tablet (4 mg total) by mouth every 6 (six) hours as needed for nausea or vomiting. 20 tablet 0   potassium chloride  (KLOR-CON  M) 10 MEQ tablet Take 1 tablet (10 mEq total) by mouth daily for 10 days. 10 tablet 0   rosuvastatin  (CRESTOR ) 40 MG tablet Take 1 tablet (40 mg total) by mouth daily. 90 tablet 3   No current facility-administered medications on file prior to visit.

## 2024-06-22 NOTE — Telephone Encounter (Signed)
-----   Message from Lamar Fitch, MD sent at 06/21/2024 12:30 PM EST ----- Cholesterol still high, please increase Crestor  to 40 mg daily, fasting lipid profile, AST LT 6 weeks

## 2024-06-22 NOTE — Telephone Encounter (Signed)
 Pt viewed lab results on My Chart per Dr. Karry note. Routed to PCP.

## 2024-06-22 NOTE — Telephone Encounter (Signed)
 Orders placed.

## 2024-06-24 ENCOUNTER — Encounter: Payer: Self-pay | Admitting: Family Medicine

## 2024-06-24 ENCOUNTER — Ambulatory Visit: Admitting: Family Medicine

## 2024-06-24 VITALS — BP 114/78 | HR 78 | Ht 67.0 in | Wt 165.0 lb

## 2024-06-24 DIAGNOSIS — E119 Type 2 diabetes mellitus without complications: Secondary | ICD-10-CM | POA: Diagnosis not present

## 2024-06-24 DIAGNOSIS — J432 Centrilobular emphysema: Secondary | ICD-10-CM

## 2024-06-24 DIAGNOSIS — E876 Hypokalemia: Secondary | ICD-10-CM | POA: Diagnosis not present

## 2024-06-24 DIAGNOSIS — G4452 New daily persistent headache (NDPH): Secondary | ICD-10-CM | POA: Diagnosis not present

## 2024-06-24 LAB — POTASSIUM: Potassium: 3.7 meq/L (ref 3.5–5.1)

## 2024-06-24 LAB — HEMOGLOBIN A1C: Hgb A1c MFr Bld: 6.9 % — ABNORMAL HIGH (ref 4.6–6.5)

## 2024-06-24 MED ORDER — SPIRIVA RESPIMAT 2.5 MCG/ACT IN AERS: 2.0000 | INHALATION_SPRAY | Freq: Every day | RESPIRATORY_TRACT | 3 refills | Status: AC

## 2024-06-24 NOTE — Patient Instructions (Signed)
 Good to see you today I will check your A1c today and your potassium level Be sure to ask Lauraine about the plan for your anticoagulation around your operation  Try adding the Spiriva  inhaler for your shortness of breath- let me know how this works for you We will also set you up for a CT of your head due to your headache Recommend seeing your eye doctor to have your eye pressure checked

## 2024-06-25 ENCOUNTER — Ambulatory Visit: Payer: Self-pay | Admitting: Cardiology

## 2024-06-25 NOTE — Telephone Encounter (Signed)
 Form has been faxed, confirmation has been received.

## 2024-07-05 ENCOUNTER — Encounter: Payer: Self-pay | Admitting: Family

## 2024-07-05 ENCOUNTER — Ambulatory Visit (HOSPITAL_BASED_OUTPATIENT_CLINIC_OR_DEPARTMENT_OTHER)
Admission: RE | Admit: 2024-07-05 | Discharge: 2024-07-05 | Disposition: A | Discharge status: Home / Self Care | Source: Ambulatory Visit | Attending: Family Medicine | Admitting: Family Medicine

## 2024-07-05 ENCOUNTER — Inpatient Hospital Stay: Admitting: Family

## 2024-07-05 ENCOUNTER — Inpatient Hospital Stay: Attending: Medical Oncology

## 2024-07-05 VITALS — BP 104/75 | HR 105 | Temp 98.3°F | Resp 18 | Wt 162.8 lb

## 2024-07-05 DIAGNOSIS — G4452 New daily persistent headache (NDPH): Secondary | ICD-10-CM | POA: Insufficient documentation

## 2024-07-05 DIAGNOSIS — I2699 Other pulmonary embolism without acute cor pulmonale: Secondary | ICD-10-CM | POA: Insufficient documentation

## 2024-07-05 DIAGNOSIS — M1611 Unilateral primary osteoarthritis, right hip: Secondary | ICD-10-CM | POA: Insufficient documentation

## 2024-07-05 DIAGNOSIS — Z7901 Long term (current) use of anticoagulants: Secondary | ICD-10-CM | POA: Diagnosis not present

## 2024-07-05 DIAGNOSIS — I2693 Single subsegmental pulmonary embolism without acute cor pulmonale: Secondary | ICD-10-CM | POA: Diagnosis not present

## 2024-07-05 DIAGNOSIS — Z5181 Encounter for therapeutic drug level monitoring: Secondary | ICD-10-CM

## 2024-07-05 LAB — CBC WITH DIFFERENTIAL (CANCER CENTER ONLY)
Abs Immature Granulocytes: 0.01 K/uL (ref 0.00–0.07)
Basophils Absolute: 0.1 K/uL (ref 0.0–0.1)
Basophils Relative: 1 %
Eosinophils Absolute: 0.1 K/uL (ref 0.0–0.5)
Eosinophils Relative: 1 %
HCT: 39 % (ref 36.0–46.0)
Hemoglobin: 12.5 g/dL (ref 12.0–15.0)
Immature Granulocytes: 0 %
Lymphocytes Relative: 49 %
Lymphs Abs: 4.3 K/uL — ABNORMAL HIGH (ref 0.7–4.0)
MCH: 30.2 pg (ref 26.0–34.0)
MCHC: 32.1 g/dL (ref 30.0–36.0)
MCV: 94.2 fL (ref 80.0–100.0)
Monocytes Absolute: 0.6 K/uL (ref 0.1–1.0)
Monocytes Relative: 7 %
Neutro Abs: 3.7 K/uL (ref 1.7–7.7)
Neutrophils Relative %: 42 %
Platelet Count: 341 K/uL (ref 150–400)
RBC: 4.14 MIL/uL (ref 3.87–5.11)
RDW: 14 % (ref 11.5–15.5)
WBC Count: 8.8 K/uL (ref 4.0–10.5)
nRBC: 0 % (ref 0.0–0.2)

## 2024-07-05 LAB — CMP (CANCER CENTER ONLY)
ALT: 27 U/L (ref 0–44)
AST: 41 U/L (ref 15–41)
Albumin: 4.6 g/dL (ref 3.5–5.0)
Alkaline Phosphatase: 88 U/L (ref 38–126)
Anion gap: 13 (ref 5–15)
BUN: 18 mg/dL (ref 8–23)
CO2: 26 mmol/L (ref 22–32)
Calcium: 10.3 mg/dL (ref 8.9–10.3)
Chloride: 102 mmol/L (ref 98–111)
Creatinine: 1.11 mg/dL — ABNORMAL HIGH (ref 0.44–1.00)
GFR, Estimated: 55 mL/min — ABNORMAL LOW
Glucose, Bld: 107 mg/dL — ABNORMAL HIGH (ref 70–99)
Potassium: 4.4 mmol/L (ref 3.5–5.1)
Sodium: 141 mmol/L (ref 135–145)
Total Bilirubin: 0.4 mg/dL (ref 0.0–1.2)
Total Protein: 7.7 g/dL (ref 6.5–8.1)

## 2024-07-05 LAB — FOLATE: Folate: 17.1 ng/mL

## 2024-07-05 LAB — VITAMIN B12: Vitamin B-12: 1293 pg/mL — ABNORMAL HIGH (ref 180–914)

## 2024-07-05 LAB — IRON AND IRON BINDING CAPACITY (CC-WL,HP ONLY)
Iron: 75 ug/dL (ref 28–170)
Saturation Ratios: 19 % (ref 10.4–31.8)
TIBC: 393 ug/dL (ref 250–450)
UIBC: 319 ug/dL

## 2024-07-05 LAB — FERRITIN: Ferritin: 33 ng/mL (ref 11–307)

## 2024-07-05 LAB — ANTITHROMBIN III: AntiThromb III Func: 125 % — ABNORMAL HIGH (ref 75–120)

## 2024-07-05 NOTE — Progress Notes (Signed)
 " Hematology and Oncology Follow Up Visit  Margaret Sims [id number] 05-13-1961 63 y.o. 07/05/2024   Principle Diagnosis:  Single Substantial Pulmonary Embolism without acute cor pulmonale dx 10/2023 - provoked (smoking/travel)   Current Therapy:        Eliquis  5 mg PO daily x 12 months will complete 10/2024   Interim History:  Ms. Wehrman is here today for follow-up. She is doing fairly well. He biggest issue at this time is osteoarthritis in the right hip. This is causing her a great deal of pain and limiting her mobility. She states that she has received a steroid injection in October and has to wait until January for insurance to approve her to have hip replacement.  No swelling, numbness or tingling noted.  She ambulates with cane for added support.  No falls or syncope reported.  She has tolerated Eliquis  nicely and is taking BID as directed. She has also been taking aspirin  daily which she will stop now until she finishes her course with Eliquis .  No blood loss noted. No abnormal bruising, no petechiae.  She has some SOB with over exertion that resolves with taking a break to rest.  She also has history of palpitations and is followed by cardiology.  No fever, chill, n/v, cough, rash, dizziness, chest pain, abdominal pain or changes in bowel or bladder habits.  Appetite and hydration are good. Weight is stable at 162 lbs.   ECOG Performance Status: 1 - Symptomatic but completely ambulatory  Medications:  Allergies as of 07/05/2024       Reactions   Lisinopril  Swelling   Angioedema noted 09/2023. Left side of tongue.        Medication List        Accurate as of July 05, 2024  1:16 PM. If you have any questions, ask your nurse or doctor.          amLODipine  10 MG tablet Commonly known as: NORVASC  Take 1 tablet (10 mg total) by mouth daily.   aspirin  81 MG tablet Take 81 mg by mouth daily.   Eliquis  5 MG Tabs tablet Generic drug: apixaban  Take 1 tablet (5  mg total) by mouth 2 (two) times daily.   hydrochlorothiazide  12.5 MG tablet Commonly known as: HYDRODIURIL  Take 1 tablet (12.5 mg total) by mouth daily.   metFORMIN  500 MG tablet Commonly known as: GLUCOPHAGE  TAKE 1 TABLET (500 MG TOTAL) BY MOUTH DAILY.   omeprazole  40 MG capsule Commonly known as: PRILOSEC Take 1 capsule (40 mg total) by mouth daily.   ondansetron  4 MG disintegrating tablet Commonly known as: ZOFRAN -ODT Take 1 tablet (4 mg total) by mouth every 6 (six) hours as needed for nausea or vomiting.   rosuvastatin  40 MG tablet Commonly known as: Crestor  Take 1 tablet (40 mg total) by mouth daily.   Spiriva  Respimat 2.5 MCG/ACT Aers Generic drug: Tiotropium Bromide Inhale 2 puffs into the lungs daily.        Allergies: Allergies[1]  Past Medical History, Surgical history, Social history, and Family History were reviewed and updated.  Review of Systems: All other 10 point review of systems is negative.   Physical Exam:  vitals were not taken for this visit.   Wt Readings from Last 3 Encounters:  06/24/24 165 lb (74.8 kg)  06/15/24 162 lb (73.5 kg)  06/10/24 159 lb (72.1 kg)    Ocular: Sclerae unicteric, pupils equal, round and reactive to light Ear-nose-throat: Oropharynx clear, dentition fair Lymphatic: No cervical or supraclavicular  adenopathy Lungs no rales or rhonchi, good excursion bilaterally Heart regular rate and rhythm, no murmur appreciated Abd soft, nontender, positive bowel sounds MSK no focal spinal tenderness, no joint edema Neuro: non-focal, well-oriented, appropriate affect Breasts: Deferred   Lab Results  Component Value Date   WBC 9.0 06/10/2024   HGB 12.1 06/10/2024   HCT 37.4 06/10/2024   MCV 92.8 06/10/2024   PLT 293 06/10/2024   Lab Results  Component Value Date   FERRITIN 30 01/01/2024   IRON 65 01/01/2024   TIBC 358 01/01/2024   UIBC 293 01/01/2024   IRONPCTSAT 18 01/01/2024   Lab Results  Component Value Date    RETICCTPCT 2.8 01/01/2024   RBC 4.03 06/10/2024   No results found for: KPAFRELGTCHN, LAMBDASER, KAPLAMBRATIO No results found for: IGGSERUM, IGA, IGMSERUM No results found for: STEPHANY CARLOTA BENSON MARKEL EARLA JOANNIE DOC VICK, SPEI   Chemistry      Component Value Date/Time   NA 140 06/10/2024 1516   NA 141 09/08/2015 0000   K 3.7 06/24/2024 1101   CL 103 06/10/2024 1516   CO2 26 06/10/2024 1516   BUN 16 06/10/2024 1516   BUN 10 09/08/2015 0000   CREATININE 1.18 (H) 06/10/2024 1516   CREATININE 1.11 (H) 01/01/2024 1055   CREATININE 0.91 05/01/2020 1020   GLU 96 09/08/2015 0000      Component Value Date/Time   CALCIUM  10.0 06/10/2024 1516   ALKPHOS 81 01/01/2024 1055   AST 21 06/18/2024 1043   AST 26 01/01/2024 1055   ALT 11 06/18/2024 1043   ALT 17 01/01/2024 1055   BILITOT 0.6 01/01/2024 1055       Impression and Plan: Patient is a 63 yo African American female with history of provoked PE (smoking and travel) diagnosed in April 2025.  Hyper coag panel pending. If negative we will transition her to aspirin  in April 2026 at 1 year mark.  She will continue her same regimen with Eliquis  for now.  Iron and B 12 work up pending.  Follow-up in 6 months.   Lauraine Pepper, NP 12/29/20251:16 PM     [1]  Allergies Allergen Reactions   Lisinopril  Swelling    Angioedema noted 09/2023. Left side of tongue.   "

## 2024-07-06 ENCOUNTER — Other Ambulatory Visit: Payer: Self-pay

## 2024-07-06 ENCOUNTER — Inpatient Hospital Stay

## 2024-07-06 DIAGNOSIS — I2693 Single subsegmental pulmonary embolism without acute cor pulmonale: Secondary | ICD-10-CM

## 2024-07-06 DIAGNOSIS — Z5181 Encounter for therapeutic drug level monitoring: Secondary | ICD-10-CM

## 2024-07-06 DIAGNOSIS — I2699 Other pulmonary embolism without acute cor pulmonale: Secondary | ICD-10-CM | POA: Diagnosis not present

## 2024-07-06 DIAGNOSIS — D649 Anemia, unspecified: Secondary | ICD-10-CM

## 2024-07-06 LAB — CMP (CANCER CENTER ONLY)

## 2024-07-06 LAB — LUPUS ANTICOAGULANT PANEL
DRVVT: 114.4 s — ABNORMAL HIGH (ref 0.0–47.0)
PTT Lupus Anticoagulant: 34.5 s (ref 0.0–43.5)

## 2024-07-06 LAB — PROTEIN S PANEL
Protein S Activity: 137 % (ref 63–140)
Protein S Ag, Free: 133 % (ref 61–136)
Protein S Ag, Total: 195 % — ABNORMAL HIGH (ref 60–150)

## 2024-07-06 LAB — CBC

## 2024-07-06 LAB — BETA-2-GLYCOPROTEIN I ABS, IGG/M/A
Beta-2 Glyco I IgG: <9 GPI IgG units (ref 0–20)
Beta-2-Glycoprotein I IgA: <9 GPI IgA units (ref 0–25)
Beta-2-Glycoprotein I IgM: <9 GPI IgM units (ref 0–32)

## 2024-07-06 LAB — CARDIOLIPIN ANTIBODIES, IGG, IGM, IGA
Anticardiolipin IgA: <9 U/mL (ref 0–11)
Anticardiolipin IgG: <9 GPL U/mL (ref 0–14)
Anticardiolipin IgM: <9 [MPL'U]/mL (ref 0–12)

## 2024-07-06 LAB — DRVVT MIX: dRVVT Mix: 69.9 s — ABNORMAL HIGH (ref 0.0–40.4)

## 2024-07-06 LAB — DRVVT CONFIRM: dRVVT Confirm: 1.3 ratio — ABNORMAL HIGH (ref 0.8–1.2)

## 2024-07-07 ENCOUNTER — Ambulatory Visit (HOSPITAL_BASED_OUTPATIENT_CLINIC_OR_DEPARTMENT_OTHER)

## 2024-07-07 LAB — HOMOCYSTEINE: Homocysteine: 10.3 umol/L (ref 0.0–17.2)

## 2024-07-08 LAB — PROTEIN C, TOTAL: Protein C, Total: 116 % (ref 60–150)

## 2024-07-09 LAB — PROTHROMBIN GENE MUTATION

## 2024-07-12 ENCOUNTER — Ambulatory Visit (HOSPITAL_BASED_OUTPATIENT_CLINIC_OR_DEPARTMENT_OTHER)
Admission: RE | Admit: 2024-07-12 | Discharge: 2024-07-12 | Disposition: A | Discharge status: Home / Self Care | Source: Ambulatory Visit | Attending: Cardiology | Admitting: Cardiology

## 2024-07-12 ENCOUNTER — Encounter: Payer: Self-pay | Admitting: Family

## 2024-07-12 DIAGNOSIS — R0609 Other forms of dyspnea: Secondary | ICD-10-CM | POA: Diagnosis present

## 2024-07-12 LAB — ECHOCARDIOGRAM COMPLETE
AR max vel: 2.15 cm2
AV Area VTI: 2.09 cm2
AV Area mean vel: 2.01 cm2
AV Mean grad: 4 mmHg
AV Peak grad: 7 mmHg
Ao pk vel: 1.32 m/s
Area-P 1/2: 5.13 cm2
Calc EF: 54.8 %
S' Lateral: 2.2 cm
Single Plane A2C EF: 59 %
Single Plane A4C EF: 57.1 %

## 2024-07-13 ENCOUNTER — Encounter: Payer: Self-pay | Admitting: Cardiology

## 2024-07-13 ENCOUNTER — Telehealth: Payer: Self-pay

## 2024-07-13 LAB — FACTOR 5 LEIDEN

## 2024-07-13 NOTE — Telephone Encounter (Signed)
 Patient read echo results on MyChart on 07/13/24.  Alan, RN

## 2024-07-13 NOTE — Telephone Encounter (Signed)
 Left message on My Chart with Echo results per Dr. Karry note. Routed to PCP.

## 2024-07-14 ENCOUNTER — Other Ambulatory Visit: Payer: Self-pay | Admitting: Family

## 2024-07-14 ENCOUNTER — Telehealth: Payer: Self-pay | Admitting: Family

## 2024-07-14 DIAGNOSIS — R760 Raised antibody titer: Secondary | ICD-10-CM

## 2024-07-14 DIAGNOSIS — I2693 Single subsegmental pulmonary embolism without acute cor pulmonale: Secondary | ICD-10-CM

## 2024-07-14 DIAGNOSIS — Z7901 Long term (current) use of anticoagulants: Secondary | ICD-10-CM

## 2024-07-14 LAB — FACTOR V LEIDEN (PCR W/GEL ELECTROPHORESIS)

## 2024-07-14 NOTE — Telephone Encounter (Signed)
 I was able to speak with Ms. Margaret Sims and go over her hyper coag panel which did show positive lupus anticoagulant. She will continue her same regimen with Eliquis  5 mg PO BID.  From our standpoint she is ok to move forward with hip replacement with Dr. Jerri. I have reached out to him to let him know. We recommend she hold her Eliquis  starting 2 days prior to surgery and then restart the day after. Patient verbalized understanding and agreement.  Np other questions or concerns at this time. Patient appreciative of call.

## 2024-07-15 ENCOUNTER — Encounter: Payer: Self-pay | Admitting: Orthopaedic Surgery

## 2024-07-16 ENCOUNTER — Encounter: Payer: Self-pay | Admitting: Family Medicine

## 2024-07-21 ENCOUNTER — Encounter: Payer: Self-pay | Admitting: Internal Medicine

## 2024-08-02 ENCOUNTER — Other Ambulatory Visit: Payer: Self-pay | Admitting: Physician Assistant

## 2024-08-02 MED ORDER — ONDANSETRON HCL 4 MG PO TABS: 4.0000 mg | ORAL_TABLET | Freq: Three times a day (TID) | ORAL | 0 refills | Status: AC | PRN

## 2024-08-02 MED ORDER — DOXYCYCLINE HYCLATE 100 MG PO CAPS: 100.0000 mg | ORAL_CAPSULE | Freq: Two times a day (BID) | ORAL | 0 refills | Status: AC

## 2024-08-02 MED ORDER — DOCUSATE SODIUM 100 MG PO CAPS: 100.0000 mg | ORAL_CAPSULE | Freq: Every day | ORAL | 2 refills | Status: AC | PRN

## 2024-08-02 MED ORDER — METHOCARBAMOL 750 MG PO TABS: 750.0000 mg | ORAL_TABLET | Freq: Three times a day (TID) | ORAL | 2 refills | Status: AC | PRN

## 2024-08-02 MED ORDER — OXYCODONE-ACETAMINOPHEN 5-325 MG PO TABS: 1.0000 | ORAL_TABLET | Freq: Three times a day (TID) | ORAL | 0 refills | Status: AC | PRN

## 2024-08-03 NOTE — Patient Instructions (Addendum)
 SURGICAL WAITING ROOM VISITATION  Patients having surgery or a procedure may have no more than 2 support people in the waiting area - these visitors may rotate.    Children ages 22 and under will not be able to visit patients in [facility] under most circumstances.   Visitors with respiratory illnesses are discouraged from visiting and should remain at home.  If the patient needs to stay at the hospital during part of their recovery, the visitor guidelines for inpatient rooms apply. Pre-op nurse will coordinate an appropriate time for 1 support person to accompany patient in pre-op.  This support person may not rotate.    Please refer to the [facility] website for the visitor guidelines for Inpatients (after your surgery is over and you are in a regular room).       Your procedure is scheduled on: 08/13/24   Report to [facility] Main Entrance    Report to admitting at 5:15 AM   Call this number if you have problems the morning of surgery [phone]   Do not eat food :After Midnight.   After Midnight you may have the following liquids until 4:30 AM DAY OF SURGERY  Water  Non-Citrus Juices (without pulp, NO RED-Apple, White grape, White cranberry) Black Coffee (NO MILK/CREAM OR CREAMERS, sugar ok)  Clear Tea (NO MILK/CREAM OR CREAMERS, sugar ok) regular and decaf                             Plain Jell-O (NO RED)                                           Fruit ices (not with fruit pulp, NO RED)                                     Popsicles (NO RED)                                                               Sports drinks like Gatorade (NO RED)              The day of surgery:  Drink ONE (1) Pre-Surgery G2 at 4:30 AM the morning of surgery. Drink in one sitting. Do not sip.  This drink was given to you during your hospital  pre-op appointment visit. Nothing else to drink after completing the  Pre-Surgery G2.     Oral Hygiene is also important to reduce  your risk of infection.                                    Remember - BRUSH YOUR TEETH THE MORNING OF SURGERY WITH YOUR REGULAR TOOTHPASTE   Do NOT smoke after Midnight   Stop all vitamins and herbal supplements 7 days before surgery.   Take these medicines the morning of surgery with A SIP OF WATER : Amlodipine (norvasc ), omeprazole (prilosec), Rosuvastatin (crestor ), Tylenol   Do not take hydrochlorothiazide  the morning of surgery.               Hold Eliquis  for 3 days prior to surgery. Last dose to be 08/09/24 PM dose  DO NOT TAKE ANY ORAL DIABETIC MEDICATIONS DAY OF YOUR SURGERY Hold Metformin  the morning of surgery.             You may not have any metal on your body including hair pins, jewelry, and body piercing             Do not wear make-up, lotions, powders, perfumes/cologne, or deodorant  Do not wear nail polish including gel and S&S, artificial/acrylic nails, or any other type of covering on natural nails including finger and toenails. If you have artificial nails, gel coating, etc. that needs to be removed by a nail salon please have this removed prior to surgery or surgery may need to be canceled/ delayed if the surgeon/ anesthesia feels like they are unable to be safely monitored.   Do not shave  48 hours prior to surgery.               Do not bring valuables to the hospital. [place] [facility] IS NOT             RESPONSIBLE   FOR VALUABLES.   Contacts, glasses, dentures or bridgework may not be worn into surgery.   Bring small overnight bag day of surgery.   DO NOT BRING YOUR HOME MEDICATIONS TO THE HOSPITAL. PHARMACY WILL DISPENSE MEDICATIONS LISTED ON YOUR MEDICATION LIST TO YOU DURING YOUR ADMISSION IN THE HOSPITAL!    Patients discharged on the day of surgery will not be allowed to drive home.  Someone NEEDS to stay with you for the first 24 hours after anesthesia.   Special Instructions: Bring a copy of your healthcare power of attorney and living will  documents the day of surgery if you haven't scanned them before.              Please read over the following fact sheets you were given: IF YOU HAVE QUESTIONS ABOUT YOUR PRE-OP INSTRUCTIONS PLEASE CALL [phone] Margaret Sims   If you received a COVID test during your pre-op visit  it is requested that you wear a mask when out in public, stay away from anyone that may not be feeling well and notify your surgeon if you develop symptoms. If you test positive for Covid or have been in contact with anyone that has tested positive in the last 10 days please notify you surgeon.      Pre-operative 4 CHG Bath Instructions  DYNA-Hex 4 Chlorhexidine  Gluconate 4% Solution Antiseptic 4 fl. oz   You can play a key role in reducing the risk of infection after surgery. Your skin needs to be as free of germs as possible. You can reduce the number of germs on your skin by washing with CHG (chlorhexidine  gluconate) soap before surgery. CHG is an antiseptic soap that kills germs and continues to kill germs even after washing.   DO NOT use if you have an allergy to chlorhexidine /CHG or antibacterial soaps. If your skin becomes reddened or irritated, stop using the CHG and notify one of our RNs at   Please shower with the CHG soap starting 4 days before surgery using the following schedule:     Please keep in mind the following:  DO NOT shave, including legs and underarms, starting the day of your first shower.  You may shave your face at any point before/day of surgery.  Place clean sheets on your bed the day you start using CHG soap. Use a clean washcloth (not used since being washed) for each shower. DO NOT sleep with pets once you start using the CHG.  CHG Shower Instructions:  If you choose to wash your hair and private area, wash first with your normal shampoo/soap.  After you use shampoo/soap, rinse your hair and body thoroughly to remove shampoo/soap residue.  Turn the water  OFF and apply about 3  tablespoons (45 ml) of CHG soap to a CLEAN washcloth.  Apply CHG soap ONLY FROM YOUR NECK DOWN TO YOUR TOES (washing for 3-5 minutes)  DO NOT use CHG soap on face, private areas, open wounds, or sores.  Pay special attention to the area where your surgery is being performed.  If you are having back surgery, having someone wash your back for you may be helpful. Wait 2 minutes after CHG soap is applied, then you may rinse off the CHG soap.  Pat dry with a clean towel  Put on clean clothes/pajamas   If you choose to wear lotion, please use ONLY the CHG-compatible lotions on the back of this paper.     Additional instructions for the day of surgery: DO NOT APPLY any lotions, deodorants, cologne, or perfumes.   Put on clean/comfortable clothes.  Brush your teeth.  Ask your nurse before applying any prescription medications to the skin.   CHG Compatible Lotions   Aveeno Moisturizing lotion  Cetaphil Moisturizing Cream  Cetaphil Moisturizing Lotion  Clairol Herbal Essence Moisturizing Lotion, Dry Skin  Clairol Herbal Essence Moisturizing Lotion, Extra Dry Skin  Clairol Herbal Essence Moisturizing Lotion, Normal Skin  Curel Age Defying Therapeutic Moisturizing Lotion with Alpha Hydroxy  Curel Extreme Care Body Lotion  Curel Soothing Hands Moisturizing Hand Lotion  Curel Therapeutic Moisturizing Cream, Fragrance-Free  Curel Therapeutic Moisturizing Lotion, Fragrance-Free  Curel Therapeutic Moisturizing Lotion, Original Formula  Eucerin Daily Replenishing Lotion  Eucerin Dry Skin Therapy Plus Alpha Hydroxy Crme  Eucerin Dry Skin Therapy Plus Alpha Hydroxy Lotion  Eucerin Original Crme  Eucerin Original Lotion  Eucerin Plus Crme Eucerin Plus Lotion  Eucerin TriLipid Replenishing Lotion  Keri Anti-Bacterial Hand Lotion  Keri Deep Conditioning Original Lotion Dry Skin Formula Softly Scented  Keri Deep Conditioning Original Lotion, Fragrance Free Sensitive Skin Formula  Keri Lotion Fast  Absorbing Fragrance Free Sensitive Skin Formula  Keri Lotion Fast Absorbing Softly Scented Dry Skin Formula  Keri Original Lotion  Keri Skin Renewal Lotion Keri Silky Smooth Lotion  Keri Silky Smooth Sensitive Skin Lotion  Nivea Body Creamy Conditioning Oil  Nivea Body Extra Enriched Lotion  Nivea Body Original Lotion  Nivea Body Sheer Moisturizing Lotion Nivea Crme  Nivea Skin Firming Lotion  NutraDerm 30 Skin Lotion  NutraDerm Skin Lotion  NutraDerm Therapeutic Skin Cream  NutraDerm Therapeutic Skin Lotion  ProShield Protective Hand Cream  Incentive Spirometer (Watch this video at home: [email or web address])  An incentive spirometer is a tool that can help keep your lungs clear and active. This tool measures how well you are filling your lungs with each breath. Taking long deep breaths may help reverse or decrease the chance of developing breathing (pulmonary) problems (especially infection) following: A long period of time when you are unable to move or be active. BEFORE THE PROCEDURE  If the spirometer includes an indicator to show your best effort, your nurse or respiratory therapist will set it to a  desired goal. If possible, sit up straight or lean slightly forward. Try not to slouch. Hold the incentive spirometer in an upright position. INSTRUCTIONS FOR USE  Sit on the edge of your bed if possible, or sit up as far as you can in bed or on a chair. Hold the incentive spirometer in an upright position. Breathe out normally. Place the mouthpiece in your mouth and seal your lips tightly around it. Breathe in slowly and as deeply as possible, raising the piston or the ball toward the top of the column. Hold your breath for 3-5 seconds or for as long as possible. Allow the piston or ball to fall to the bottom of the column. Remove the mouthpiece from your mouth and breathe out normally. Rest for a few seconds and repeat Steps 1 through 7 at least 10 times  every 1-2 hours when you are awake. Take your time and take a few normal breaths between deep breaths. The spirometer may include an indicator to show your best effort. Use the indicator as a goal to work toward during each repetition. After each set of 10 deep breaths, practice coughing to be sure your lungs are clear. If you have an incision (the cut made at the time of surgery), support your incision when coughing by placing a pillow or rolled up towels firmly against it. Once you are able to get out of bed, walk around indoors and cough well. You may stop using the incentive spirometer when instructed by your caregiver.  RISKS AND COMPLICATIONS Take your time so you do not get dizzy or light-headed. If you are in pain, you may need to take or ask for pain medication before doing incentive spirometry. It is harder to take a deep breath if you are having pain. AFTER USE Rest and breathe slowly and easily. It can be helpful to keep track of a log of your progress. Your caregiver can provide you with a simple table to help with this. If you are using the spirometer at home, follow these instructions: SEEK MEDICAL CARE IF:  You are having difficultly using the spirometer. You have trouble using the spirometer as often as instructed. Your pain medication is not giving enough relief while using the spirometer. You develop fever of 100.5 F (38.1 C) or higher. SEEK IMMEDIATE MEDICAL CARE IF:  You cough up bloody sputum that had not been present before. You develop fever of 102 F (38.9 C) or greater. You develop worsening pain at or near the incision site. MAKE SURE YOU:  Understand these instructions. Will watch your condition. Will get help right away if you are not doing well or get worse. Document Released: 11/04/2006 Document Revised: 09/16/2011 Document Reviewed: 01/05/2007   WHAT IS A BLOOD TRANSFUSION? Blood Transfusion Information  A transfusion is the replacement of blood or some  of its parts. Blood is made up of multiple cells which provide different functions. Red blood cells carry oxygen and are used for blood loss replacement. White blood cells fight against infection. Platelets control bleeding. Plasma helps clot blood. Other blood products are available for specialized needs, such as hemophilia or other clotting disorders. BEFORE THE TRANSFUSION  Who gives blood for transfusions?  Healthy volunteers who are fully evaluated to make sure their blood is safe. This is blood bank blood. Transfusion therapy is the safest it has ever been in the practice of medicine. Before blood is taken from a donor, a complete history is taken to make sure that person  has no history of diseases nor engages in risky social behavior (examples are intravenous drug use or sexual activity with multiple partners). The donor's travel history is screened to minimize risk of transmitting infections, such as malaria. The donated blood is tested for signs of infectious diseases, such as HIV and hepatitis. The blood is then tested to be sure it is compatible with you in order to minimize the chance of a transfusion reaction. If you or a relative donates blood, this is often done in anticipation of surgery and is not appropriate for emergency situations. It takes many days to process the donated blood. RISKS AND COMPLICATIONS Although transfusion therapy is very safe and saves many lives, the main dangers of transfusion include:  Getting an infectious disease. Developing a transfusion reaction. This is an allergic reaction to something in the blood you were given. Every precaution is taken to prevent this. The decision to have a blood transfusion has been considered carefully by your caregiver before blood is given. Blood is not given unless the benefits outweigh the risks. AFTER THE TRANSFUSION Right after receiving a blood transfusion, you will usually feel much better and more energetic. This is  especially true if your red blood cells have gotten low (anemic). The transfusion raises the level of the red blood cells which carry oxygen, and this usually causes an energy increase. The nurse administering the transfusion will monitor you carefully for complications. HOME CARE INSTRUCTIONS  No special instructions are needed after a transfusion. You may find your energy is better. Speak with your caregiver about any limitations on activity for underlying diseases you may have. SEEK MEDICAL CARE IF:  Your condition is not improving after your transfusion. You develop redness or irritation at the intravenous (IV) site. SEEK IMMEDIATE MEDICAL CARE IF:  Any of the following symptoms occur over the next 12 hours: Shaking chills. You have a temperature by mouth above 102 F (38.9 C), not controlled by medicine. Chest, back, or muscle pain. People around you feel you are not acting correctly or are confused. Shortness of breath or difficulty breathing. Dizziness and fainting. You get a rash or develop hives. You have a decrease in urine output. Your urine turns a dark color or changes to pink, red, or brown. Any of the following symptoms occur over the next 10 days: You have a temperature by mouth above 102 F (38.9 C), not controlled by medicine. Shortness of breath. Weakness after normal activity. The white part of the eye turns yellow (jaundice). You have a decrease in the amount of urine or are urinating less often. Your urine turns a dark color or changes to pink, red, or brown. Document Released: 06/21/2000 Document Revised: 09/16/2011 Document Reviewed: 02/08/2008 [facility] Patient Information 2014 [place], [place].How to Manage Your Diabetes Before and After Surgery  Why is it important to control my blood sugar before and after surgery? Improving blood sugar levels before and after surgery helps healing and can limit problems. A way of improving blood sugar control is eating a  healthy diet by:  Eating less sugar and carbohydrates  Increasing activity/exercise  Talking with your doctor about reaching your blood sugar goals High blood sugars (greater than 180 mg/dL) can raise your risk of infections and slow your recovery, so you will need to focus on controlling your diabetes during the weeks before surgery. Make sure that the doctor who takes care of your diabetes knows about your planned surgery including the date and location.  How do I  manage my blood sugar before surgery? Check your blood sugar at least 4 times a day, starting 2 days before surgery, to make sure that the level is not too high or low. Check your blood sugar the morning of your surgery when you wake up and every 2 hours until you get to the Short Stay unit. If your blood sugar is less than 70 mg/dL, you will need to treat for low blood sugar: Do not take insulin . Treat a low blood sugar (less than 70 mg/dL) with  cup of clear juice (cranberry or apple), 4 glucose tablets, OR glucose gel. Recheck blood sugar in 15 minutes after treatment (to make sure it is greater than 70 mg/dL). If your blood sugar is not greater than 70 mg/dL on recheck, call [phone] for further instructions. Report your blood sugar to the short stay nurse when you get to Short Stay.  If you are admitted to the hospital after surgery: Your blood sugar will be checked by the staff and you will probably be given insulin  after surgery (instead of oral diabetes medicines) to make sure you have good blood sugar levels. The goal for blood sugar control after surgery is 80-180 mg/dL.   WHAT DO I DO ABOUT MY DIABETES MEDICATION?  Do not take oral diabetes medicines (pills) the morning of surgery.     Patient Signature:  Date:   Nurse Signature:  Date:   Reviewed and Endorsed by [facility] Patient Education Committee, August 2015

## 2024-08-03 NOTE — Progress Notes (Addendum)
 COVID Vaccine received:  []  No [x]  Yes Date of any COVID positive Test in last 90 days: none PCP - Harlene Schroeder MD Cardiologist - Jennifer Crape MD  Chest x-ray - Chest CT- 06/10/24 Epic EKG -  06/15/24 Epic Stress Test - 06/21/24 Epic ECHO - 07/12/24 Epic Cardiac Cath -   Bowel Prep - [x]  No  []   Yes ______  Pacemaker / ICD device [x]  No []  Yes   Spinal Cord Stimulator:[x]  No []  Yes       History of Sleep Apnea? [x]  No []  Yes   CPAP used?- [x]  No []  Yes    Does the patient monitor blood sugar?          [x]  No []  Yes  []  N/A  Patient has: []  NO Hx DM   []  Pre-DM                 []  DM1  [x]   DM2 Does patient have a [employer] or Dexacom? [x]  No []  Yes   Fasting Blood Sugar Ranges-  Checks Blood Sugar _____ times a day  GLP1 agonist / usual dose - no GLP1 instructions:  SGLT-2 inhibitors / usual dose - no SGLT-2 instructions:   Blood Thinner / Instructions:Eliquis  BID, Last dose to be 08/09/24 8 PM dose Aspirin  Instructions:no  Comments:   Activity level: Patient is able to climb a flight of stairs without difficulty; [x]  No CP  [x]  No SOB, but would have _hip pain__   Patient can  perform ADLs without assistance.   Anesthesia review: HTN, DM, CAD, Hx. PE,  On Eliquis ,  Cardiology saw pt 06/15/24 and Echo ordered.   Patient denies shortness of breath, fever, cough and chest pain at PAT appointment.  Patient verbalized understanding and agreement to the Pre-Surgical Instructions that were given to them at this PAT appointment. Patient was also educated of the need to review these PAT instructions again prior to his/her surgery.I reviewed the appropriate phone numbers to call if they have any and questions or concerns.

## 2024-08-06 ENCOUNTER — Encounter (HOSPITAL_COMMUNITY): Payer: Self-pay

## 2024-08-06 ENCOUNTER — Other Ambulatory Visit: Payer: Self-pay

## 2024-08-06 ENCOUNTER — Encounter (HOSPITAL_COMMUNITY)
Admission: RE | Admit: 2024-08-06 | Discharge: 2024-08-06 | Disposition: A | Source: Ambulatory Visit | Attending: Orthopaedic Surgery | Admitting: Orthopaedic Surgery

## 2024-08-06 VITALS — BP 105/81 | HR 98 | Temp 98.1°F | Resp 16 | Ht 67.0 in | Wt 160.0 lb

## 2024-08-06 DIAGNOSIS — J449 Chronic obstructive pulmonary disease, unspecified: Secondary | ICD-10-CM | POA: Diagnosis not present

## 2024-08-06 DIAGNOSIS — Z7984 Long term (current) use of oral hypoglycemic drugs: Secondary | ICD-10-CM | POA: Diagnosis not present

## 2024-08-06 DIAGNOSIS — M1611 Unilateral primary osteoarthritis, right hip: Secondary | ICD-10-CM | POA: Insufficient documentation

## 2024-08-06 DIAGNOSIS — I129 Hypertensive chronic kidney disease with stage 1 through stage 4 chronic kidney disease, or unspecified chronic kidney disease: Secondary | ICD-10-CM | POA: Insufficient documentation

## 2024-08-06 DIAGNOSIS — Z01812 Encounter for preprocedural laboratory examination: Secondary | ICD-10-CM | POA: Insufficient documentation

## 2024-08-06 DIAGNOSIS — Z01818 Encounter for other preprocedural examination: Secondary | ICD-10-CM

## 2024-08-06 DIAGNOSIS — I2510 Atherosclerotic heart disease of native coronary artery without angina pectoris: Secondary | ICD-10-CM | POA: Insufficient documentation

## 2024-08-06 DIAGNOSIS — Z86711 Personal history of pulmonary embolism: Secondary | ICD-10-CM | POA: Diagnosis not present

## 2024-08-06 DIAGNOSIS — E1122 Type 2 diabetes mellitus with diabetic chronic kidney disease: Secondary | ICD-10-CM | POA: Diagnosis not present

## 2024-08-06 DIAGNOSIS — N183 Chronic kidney disease, stage 3 unspecified: Secondary | ICD-10-CM | POA: Diagnosis not present

## 2024-08-06 DIAGNOSIS — E1140 Type 2 diabetes mellitus with diabetic neuropathy, unspecified: Secondary | ICD-10-CM | POA: Insufficient documentation

## 2024-08-06 DIAGNOSIS — Z7901 Long term (current) use of anticoagulants: Secondary | ICD-10-CM | POA: Insufficient documentation

## 2024-08-06 DIAGNOSIS — Z87891 Personal history of nicotine dependence: Secondary | ICD-10-CM | POA: Insufficient documentation

## 2024-08-06 LAB — BASIC METABOLIC PANEL WITH GFR
Anion gap: 9 (ref 5–15)
BUN: 13 mg/dL (ref 8–23)
CO2: 29 mmol/L (ref 22–32)
Calcium: 10.1 mg/dL (ref 8.9–10.3)
Chloride: 100 mmol/L (ref 98–111)
Creatinine, Ser: 1.09 mg/dL — ABNORMAL HIGH (ref 0.44–1.00)
GFR, Estimated: 56 mL/min — ABNORMAL LOW
Glucose, Bld: 111 mg/dL — ABNORMAL HIGH (ref 70–99)
Potassium: 3.3 mmol/L — ABNORMAL LOW (ref 3.5–5.1)
Sodium: 139 mmol/L (ref 135–145)

## 2024-08-06 LAB — CBC
HCT: 39.7 % (ref 36.0–46.0)
Hemoglobin: 12.4 g/dL (ref 12.0–15.0)
MCH: 29.9 pg (ref 26.0–34.0)
MCHC: 31.2 g/dL (ref 30.0–36.0)
MCV: 95.7 fL (ref 80.0–100.0)
Platelets: 310 10*3/uL (ref 150–400)
RBC: 4.15 MIL/uL (ref 3.87–5.11)
RDW: 12.8 % (ref 11.5–15.5)
WBC: 7.2 10*3/uL (ref 4.0–10.5)
nRBC: 0 % (ref 0.0–0.2)

## 2024-08-06 LAB — GLUCOSE, CAPILLARY: Glucose-Capillary: 113 mg/dL — ABNORMAL HIGH (ref 70–99)

## 2024-08-06 LAB — SURGICAL PCR SCREEN
MRSA, PCR: NEGATIVE
Staphylococcus aureus: NEGATIVE

## 2024-08-08 NOTE — Progress Notes (Signed)
 " Case: [id number] Date/Time: 08/13/24 0715   Procedure: ARTHROPLASTY, HIP, TOTAL, ANTERIOR APPROACH (Right: Hip)   Anesthesia type: Spinal   Diagnosis: Primary osteoarthritis of right hip [M16.11]   Pre-op diagnosis: right hip osteoarthritis   Location: [facility] ROOM 08 / [facility]   Surgeons: Jerri Kay HERO, MD       DISCUSSION: Margaret Sims is a 64 yo female with PMH of former smoking, COPD, HTN, hx of NSVT, CAD (on imaging), hx of PE on eliquis , GERD, T2DM (A1c 6.9), CKD3, arthritis.  Patient follows with Cardiology for hx of NSVT. Patient reported SOB and stress test and echo were scheduled. Per Dr. Bernie:  Cardiovascular evaluation before elective hip replacement surgery this lady with numerous risk factors for coronary artery disease.  Her ability to do exercise is limited because of hip pain.  She is unable to do 4 METS.  Therefore she need to be evaluated for potential ischemia.  Will schedule her to have a stress test.  Because of shortness of breath and that recently brought her to the emergency room I will schedule her also to have echocardiogram to assess left ventricular ejection fraction.  Stress test was low risk. Echo with normal LVEF, moderate LVH, grade I DD, moderate TR. Patient cleared for surgery  Hx of PE diagnosed in 10/2023. Thought to be provoked due to travel and on Eliquis .  Hx of COPD. On inhalers.  LD Eliquis : 2/2 at 8PM   VS: BP 105/81   Pulse 98   Temp 36.7 C (Oral)   Resp 16   Ht 5' 7 (1.702 m)   Wt 72.6 kg   SpO2 100%   BMI 25.06 kg/m   PROVIDERS: Copland, Harlene BROCKS, MD   LABS: Labs reviewed: Acceptable for surgery. (all labs ordered are listed, but only abnormal results are displayed)  Labs Reviewed  BASIC METABOLIC PANEL WITH GFR - Abnormal; Notable for the following components:      Result Value   Potassium 3.3 (*)    Glucose, Bld 111 (*)    Creatinine, Ser 1.09 (*)    GFR, Estimated 56 (*)    All other components within normal limits   GLUCOSE, CAPILLARY - Abnormal; Notable for the following components:   Glucose-Capillary 113 (*)    All other components within normal limits  SURGICAL PCR SCREEN  CBC  TYPE AND SCREEN     CTA Chest 06/10/24:  IMPRESSION: 1. No acute cardiopulmonary disease and no evidence of pulmonary embolism. 2. Borderline stable cardiomegaly. Atherosclerotic coronary artery disease. 3. Mild centrilobular emphysematous disease. 4. Aortic atherosclerosis.  EKG 06/15/24:  Sinus tachycardia, rate 104 LAD Minimal voltage criteria for LVH, may be normal variant Possible lateral infarct, age undetermined   Echo 07/12/24:  IMPRESSIONS    1. Left ventricular ejection fraction, by estimation, is 60 to 65%. Left ventricular ejection fraction by 3D volume is 59 %. The left ventricle has normal function. The left ventricle has no regional wall motion abnormalities. There is moderate left ventricular hypertrophy. Left ventricular diastolic parameters are consistent with Grade I diastolic dysfunction (impaired relaxation). The average left ventricular global longitudinal strain is -18.6 %. The global longitudinal strain is normal.  2. Right ventricular systolic function is normal. The right ventricular size is normal.  3. The mitral valve is normal in structure. No evidence of mitral valve regurgitation. No evidence of mitral stenosis.  4. Tricuspid valve regurgitation is moderate.  5. The aortic valve is normal in structure. Aortic valve regurgitation  is trivial. No aortic stenosis is present.  6. The inferior vena cava is normal in size with greater than 50% respiratory variability, suggesting right atrial pressure of 3 mmHg.  Comparison(s): Echocardiogram done 02/06/22 showed an EF of 55-60%.  Stress test 06/21/2024:    The study is normal. The study is low risk.   LV perfusion is normal. There is no evidence of ischemia. There is no evidence of infarction.   Left ventricular function is  normal. Nuclear stress EF: 62%. The left ventricular ejection fraction is normal (55-65%). End diastolic cavity size is normal.   Coronary calcium  was present on the attenuation correction CT images. Moderate coronary calcifications were present. Coronary calcifications were present in the left anterior descending artery and left circumflex artery distribution(s). 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 IMPRESSION: Colonoscopy reveals multiple areas of diverticulosis. Will treat for suspected diverticulitis. RTO if sxs worsen or do not improve. gwt 03/05/2012   Dyslipidemia 02/19/2016   Encounter for gynecological examination 12/07/2018   Essential hypertension 02/19/2016   High cholesterol    Hypercholesterolemia 06/23/2013   Formatting of this note might be different from the original. IMPRESSION: The goal is to have your total cholesterol < 200, the HDL (good cholesterol) >40, and the LDL (bad cholesterol) <100.  And triglycerides should be under 150.`E1o3L`It is recommended that you follow a good diet low in animal and dairy fat and exercise for at least 45 minutes a day 5-6 days a week. `E1o3L`*LDL still high at 14   Obesity 12/07/2018   Syncope and collapse 01/17/2022   Tobacco use disorder 02/19/2016   Type 2 diabetes mellitus with diabetic neuropathy, unspecified (HCC) 05/01/2020   Ventricular tachycardia (HCC) 02/21/2022    Past Surgical History:  Procedure Laterality Date   CESAREAN SECTION     COLONOSCOPY  2014   normal    MEDICATIONS:  docusate sodium  (COLACE) 100 MG capsule   doxycycline  (VIBRAMYCIN ) 100 MG capsule   methocarbamol  (ROBAXIN ) 750 MG tablet   ondansetron  (ZOFRAN ) 4 MG tablet   oxyCODONE -acetaminophen  (PERCOCET) 5-325 MG tablet   acetaminophen  (TYLENOL ) 500 MG tablet   amLODipine  (NORVASC ) 10 MG  tablet   apixaban  (ELIQUIS ) 5 MG TABS tablet   hydrochlorothiazide  (HYDRODIURIL ) 12.5 MG tablet   metFORMIN  (GLUCOPHAGE ) 500 MG tablet   omeprazole  (PRILOSEC) 40 MG capsule   ondansetron  (ZOFRAN -ODT) 4 MG disintegrating tablet   rosuvastatin  (CRESTOR ) 20 MG tablet   Tiotropium Bromide (SPIRIVA  RESPIMAT) 2.5 MCG/ACT AERS   No current facility-administered medications for this encounter.   Burnard CHRISTELLA Odis DEVONNA [facility] Short Stay/Anesthesiology [facility] Phone [phone] 08/08/2024 8:00 PM       "

## 2024-08-08 NOTE — Anesthesia Preprocedure Evaluation (Signed)
 "                                  Anesthesia Evaluation  Patient identified by MRN, date of birth, ID band Patient awake    Reviewed: Allergy & Precautions, NPO status , Patient's Chart, lab work & pertinent test results  Airway Mallampati: II  TM Distance: >3 FB Neck ROM: Full    Dental  (+) Teeth Intact, Dental Advisory Given   Pulmonary former smoker   breath sounds clear to auscultation       Cardiovascular hypertension, + CAD   Rhythm:Regular Rate:Normal     Neuro/Psych negative neurological ROS  negative psych ROS   GI/Hepatic Neg liver ROS,GERD  ,,  Endo/Other  diabetes, Type 2    Renal/GU negative Renal ROS     Musculoskeletal  (+) Arthritis ,    Abdominal   Peds  Hematology negative hematology ROS (+)   Anesthesia Other Findings   Reproductive/Obstetrics                              Anesthesia Physical Anesthesia Plan  ASA: 2  Anesthesia Plan: Spinal   Post-op Pain Management: Ofirmev  IV (intra-op)*   Induction: Intravenous  PONV Risk Score and Plan: 3 and Ondansetron , Propofol  infusion and Midazolam   Airway Management Planned: Natural Airway  Additional Equipment: None  Intra-op Plan:   Post-operative Plan:   Informed Consent: I have reviewed the patients History and Physical, chart, labs and discussed the procedure including the risks, benefits and alternatives for the proposed anesthesia with the patient or authorized representative who has indicated his/her understanding and acceptance.     Dental advisory given  Plan Discussed with: CRNA  Anesthesia Plan Comments: (See PAT note from 1/30  Lab Results      Component                Value               Date                      WBC                      7.2                 08/06/2024                HGB                      12.4                08/06/2024                HCT                      39.7                08/06/2024                 MCV                      95.7                08/06/2024  PLT                      310                 08/06/2024           )         Anesthesia Quick Evaluation  "

## 2024-08-09 ENCOUNTER — Encounter: Payer: Self-pay | Admitting: Orthopaedic Surgery

## 2024-08-10 ENCOUNTER — Telehealth: Payer: Self-pay

## 2024-08-10 NOTE — Telephone Encounter (Signed)
 Pt called stating she has red itching rash on buttocks wanted to inform incase it would hinder surgery

## 2024-08-10 NOTE — Telephone Encounter (Signed)
 Appt made

## 2024-08-10 NOTE — Telephone Encounter (Signed)
 Can we have her come in to see xu on Thursday morning for him to take a look

## 2024-08-12 ENCOUNTER — Ambulatory Visit: Admitting: Orthopaedic Surgery

## 2024-08-12 DIAGNOSIS — M1611 Unilateral primary osteoarthritis, right hip: Secondary | ICD-10-CM

## 2024-08-12 MED ORDER — TRANEXAMIC ACID 1000 MG/10ML IV SOLN
2000.0000 mg | INTRAVENOUS | Status: DC
  Filled 2024-08-12: qty 20

## 2024-08-12 NOTE — Progress Notes (Signed)
 "  Office Visit Note   Patient: Margaret Sims           Date of Birth: January 26, 1961           MRN: [id number] Visit Date: 08/12/2024              Requested by: Watt Harlene BROCKS, MD [address] [place],  [place] [address] PCP: Watt Harlene BROCKS, MD   Assessment & Plan: Visit Diagnoses:  1. Primary osteoarthritis of right hip     Plan: Impression is resolving dermatitis.  Does not appear infectious or infected.  Will plan to proceed with surgery tomorrow.  Follow-Up Instructions: No follow-ups on file.   Orders:  No orders of the defined types were placed in this encounter.  No orders of the defined types were placed in this encounter.     Procedures: No procedures performed   Clinical Data: No additional findings.   Subjective: Chief Complaint  Patient presents with   Right Hip - Follow-up    Scheduled for THA 08/13/2024    HPI Margaret Sims comes in today to have a rash checked out.  She is scheduled for surgery tomorrow.  She is concerned that it may affect her ability to get the surgery. Review of Systems  Constitutional: Negative.   HENT: Negative.    Eyes: Negative.   Respiratory: Negative.    Cardiovascular: Negative.   Endocrine: Negative.   Musculoskeletal: Negative.   Neurological: Negative.   Hematological: Negative.   Psychiatric/Behavioral: Negative.    All other systems reviewed and are negative.    Objective: Vital Signs: There were no vitals taken for this visit.  Physical Exam Vitals and nursing note reviewed.  Constitutional:      Appearance: She is well-developed.  HENT:     Head: Atraumatic.     Nose: Nose normal.  Eyes:     Extraocular Movements: Extraocular movements intact.  Cardiovascular:     Pulses: Normal pulses.  Pulmonary:     Effort: Pulmonary effort is normal.  Abdominal:     Palpations: Abdomen is soft.  Musculoskeletal:     Cervical back: Neck supple.  Skin:    General: Skin is warm.     Capillary  Refill: Capillary refill takes less than 2 seconds.  Neurological:     Mental Status: She is alert. Mental status is at baseline.  Psychiatric:        Behavior: Behavior normal.        Thought Content: Thought content normal.        Judgment: Judgment normal.     Ortho Exam Examination of the right buttock shows basically a healed rash.  There is no drainage or worrisome features. Specialty Comments:  No specialty comments available.  Imaging: No results found.   PMFS History: Patient Active Problem List   Diagnosis Date Noted   History of pulmonary embolism 06/15/2024   Primary osteoarthritis of right hip 06/09/2024   Chronic right shoulder pain 05/18/2024   COVID-19 01/27/2023   Abdominal bruit 11/12/2022   Coronary artery disease 11/12/2022   Acid reflux 11/01/2022   High cholesterol 11/01/2022   Ventricular tachycardia (HCC) 02/21/2022   Syncope and collapse 01/17/2022   Type 2 diabetes mellitus with diabetic neuropathy, unspecified (HCC) 05/01/2020   Adhesive capsulitis of left shoulder 12/07/2018   Encounter for gynecological examination 12/07/2018   Obesity 12/07/2018   Essential hypertension 02/19/2016   Dyslipidemia 02/19/2016   Tobacco use disorder 02/19/2016  Hypercholesterolemia 06/23/2013   Diverticulitis of colon 03/05/2012   Past Medical History:  Diagnosis Date   Acid reflux    Adhesive capsulitis of left shoulder 12/07/2018   Diverticulitis of colon 03/05/2012   Formatting of this note might be different from the original. IMPRESSION: Colonoscopy reveals multiple areas of diverticulosis. Will treat for suspected diverticulitis. RTO if sxs worsen or do not improve. gwt 03/05/2012   Dyslipidemia 02/19/2016   Encounter for gynecological examination 12/07/2018   Essential hypertension 02/19/2016   High cholesterol    Hypercholesterolemia 06/23/2013   Formatting of this note might be different from the original. IMPRESSION: The goal is to have your  total cholesterol < 200, the HDL (good cholesterol) >40, and the LDL (bad cholesterol) <100.  And triglycerides should be under 150.`E1o3L`It is recommended that you follow a good diet low in animal and dairy fat and exercise for at least 45 minutes a day 5-6 days a week. `E1o3L`*LDL still high at 14   Obesity 12/07/2018   Syncope and collapse 01/17/2022   Tobacco use disorder 02/19/2016   Type 2 diabetes mellitus with diabetic neuropathy, unspecified (HCC) 05/01/2020   Ventricular tachycardia (HCC) 02/21/2022    Family History  Problem Relation Age of Onset   Stroke Maternal Grandmother    Colon cancer Neg Hx    Colon polyps Neg Hx    Esophageal cancer Neg Hx    Rectal cancer Neg Hx    Stomach cancer Neg Hx     Past Surgical History:  Procedure Laterality Date   CESAREAN SECTION     COLONOSCOPY  2014   normal   Social History   Occupational History   Not on file  Tobacco Use   Smoking status: Former    Current packs/day: 0.00    Average packs/day: 0.5 packs/day    Types: Cigarettes    Quit date: 09/05/2021    Years since quitting: 2.9   Smokeless tobacco: Never   Tobacco comments:    11/13/23: Has not had a cigarette in a month.  Vaping Use   Vaping status: Never Used  Substance and Sexual Activity   Alcohol  use: Yes    Comment: occasional   Drug use: Never   Sexual activity: Yes    Birth control/protection: Post-menopausal        "

## 2024-08-13 ENCOUNTER — Encounter (HOSPITAL_COMMUNITY): Admission: RE | Payer: Self-pay | Source: Home / Self Care

## 2024-08-13 ENCOUNTER — Encounter (HOSPITAL_COMMUNITY): Admitting: Certified Registered Nurse Anesthetist

## 2024-08-13 ENCOUNTER — Observation Stay (HOSPITAL_COMMUNITY): Admission: RE | Admit: 2024-08-13 | Source: Home / Self Care | Admitting: Orthopaedic Surgery

## 2024-08-13 ENCOUNTER — Other Ambulatory Visit: Payer: Self-pay

## 2024-08-13 ENCOUNTER — Observation Stay (HOSPITAL_COMMUNITY)

## 2024-08-13 ENCOUNTER — Ambulatory Visit (HOSPITAL_COMMUNITY)

## 2024-08-13 ENCOUNTER — Encounter (HOSPITAL_COMMUNITY): Payer: Self-pay | Admitting: Orthopaedic Surgery

## 2024-08-13 ENCOUNTER — Encounter (HOSPITAL_COMMUNITY): Admitting: Medical

## 2024-08-13 DIAGNOSIS — M1611 Unilateral primary osteoarthritis, right hip: Principal | ICD-10-CM | POA: Diagnosis present

## 2024-08-13 DIAGNOSIS — Z96641 Presence of right artificial hip joint: Secondary | ICD-10-CM

## 2024-08-13 LAB — GLUCOSE, CAPILLARY
Glucose-Capillary: 118 mg/dL — ABNORMAL HIGH (ref 70–99)
Glucose-Capillary: 93 mg/dL (ref 70–99)
Glucose-Capillary: 152 mg/dL — ABNORMAL HIGH (ref 70–99)
Glucose-Capillary: 113 mg/dL — ABNORMAL HIGH (ref 70–99)
Glucose-Capillary: 229 mg/dL — ABNORMAL HIGH (ref 70–99)

## 2024-08-13 LAB — TYPE AND SCREEN
ABO/RH(D): A POS
Antibody Screen: NEGATIVE

## 2024-08-13 LAB — ABO/RH: ABO/RH(D): A POS

## 2024-08-13 MED ORDER — HYDROMORPHONE HCL 1 MG/ML IJ SOLN
INTRAMUSCULAR | Status: AC
  Filled 2024-08-13: qty 1

## 2024-08-13 MED ORDER — INSULIN ASPART 100 UNIT/ML IJ SOLN: 0.0000 [IU] | Freq: Three times a day (TID) | INTRAMUSCULAR | Status: AC

## 2024-08-13 MED ORDER — CELECOXIB 200 MG PO CAPS
200.0000 mg | ORAL_CAPSULE | Freq: Two times a day (BID) | ORAL | Status: AC
  Administered 2024-08-13 (×2): 200 mg via ORAL
  Filled 2024-08-13 (×2): qty 1

## 2024-08-13 MED ORDER — MEPERIDINE HCL 25 MG/ML IJ SOLN: 6.2500 mg | INTRAMUSCULAR | Status: DC | PRN

## 2024-08-13 MED ORDER — ACETAMINOPHEN 325 MG PO TABS: 325.0000 mg | ORAL_TABLET | Freq: Four times a day (QID) | ORAL | Status: AC | PRN

## 2024-08-13 MED ORDER — DROPERIDOL 2.5 MG/ML IJ SOLN: 0.6250 mg | Freq: Once | INTRAMUSCULAR | Status: DC | PRN

## 2024-08-13 MED ORDER — SODIUM CHLORIDE 0.9 % IR SOLN
Status: DC | PRN
  Administered 2024-08-13: 1000 mL

## 2024-08-13 MED ORDER — LACTATED RINGERS IV SOLN: INTRAVENOUS | Status: DC

## 2024-08-13 MED ORDER — AMLODIPINE BESYLATE 10 MG PO TABS: 10.0000 mg | ORAL_TABLET | Freq: Every day | ORAL | Status: AC

## 2024-08-13 MED ORDER — PROPOFOL 500 MG/50ML IV EMUL
INTRAVENOUS | Status: DC | PRN
  Administered 2024-08-13: 75 ug/kg/min via INTRAVENOUS

## 2024-08-13 MED ORDER — MAGNESIUM CITRATE PO SOLN: 1.0000 | Freq: Once | ORAL | Status: AC | PRN

## 2024-08-13 MED ORDER — HYDROMORPHONE HCL 1 MG/ML IJ SOLN
0.2500 mg | INTRAMUSCULAR | Status: DC | PRN
  Administered 2024-08-13 (×2): 0.5 mg via INTRAVENOUS

## 2024-08-13 MED ORDER — OXYCODONE HCL 5 MG PO TABS: 5.0000 mg | ORAL_TABLET | Freq: Once | ORAL | Status: DC | PRN

## 2024-08-13 MED ORDER — CEFAZOLIN SODIUM-DEXTROSE 2-4 GM/100ML-% IV SOLN
2.0000 g | INTRAVENOUS | Status: AC
  Administered 2024-08-13: 2 g via INTRAVENOUS
  Filled 2024-08-13: qty 100

## 2024-08-13 MED ORDER — FENTANYL CITRATE (PF) 100 MCG/2ML IJ SOLN
INTRAMUSCULAR | Status: DC | PRN
  Administered 2024-08-13: 50 ug via INTRAVENOUS

## 2024-08-13 MED ORDER — METOCLOPRAMIDE HCL 5 MG PO TABS: 5.0000 mg | ORAL_TABLET | Freq: Three times a day (TID) | ORAL | Status: AC | PRN

## 2024-08-13 MED ORDER — PROPOFOL 1000 MG/100ML IV EMUL
INTRAVENOUS | Status: AC
  Filled 2024-08-13: qty 100

## 2024-08-13 MED ORDER — OXYCODONE HCL 5 MG PO TABS: 10.0000 mg | ORAL_TABLET | ORAL | Status: AC | PRN

## 2024-08-13 MED ORDER — PANTOPRAZOLE SODIUM 40 MG PO TBEC
40.0000 mg | DELAYED_RELEASE_TABLET | Freq: Every day | ORAL | Status: AC
  Administered 2024-08-13: 40 mg via ORAL
  Filled 2024-08-13: qty 1

## 2024-08-13 MED ORDER — TRANEXAMIC ACID-NACL 1000-0.7 MG/100ML-% IV SOLN
1000.0000 mg | Freq: Once | INTRAVENOUS | Status: AC
  Administered 2024-08-13: 1000 mg via INTRAVENOUS
  Filled 2024-08-13: qty 100

## 2024-08-13 MED ORDER — PROPOFOL 10 MG/ML IV BOLUS
INTRAVENOUS | Status: DC | PRN
  Administered 2024-08-13 (×2): 10 mg via INTRAVENOUS

## 2024-08-13 MED ORDER — ONDANSETRON HCL 4 MG PO TABS: 4.0000 mg | ORAL_TABLET | Freq: Four times a day (QID) | ORAL | Status: AC | PRN

## 2024-08-13 MED ORDER — 0.9 % SODIUM CHLORIDE (POUR BTL) OPTIME
TOPICAL | Status: DC | PRN
  Administered 2024-08-13: 1000 mL

## 2024-08-13 MED ORDER — CHLORHEXIDINE GLUCONATE 0.12 % MT SOLN
15.0000 mL | Freq: Once | OROMUCOSAL | Status: AC
  Administered 2024-08-13: 15 mL via OROMUCOSAL

## 2024-08-13 MED ORDER — BUPIVACAINE-MELOXICAM ER 400-12 MG/14ML IJ SOLN
INTRAMUSCULAR | Status: AC
  Filled 2024-08-13: qty 1

## 2024-08-13 MED ORDER — MIDAZOLAM HCL 5 MG/5ML IJ SOLN
INTRAMUSCULAR | Status: DC | PRN
  Administered 2024-08-13 (×2): 1 mg via INTRAVENOUS

## 2024-08-13 MED ORDER — BUPIVACAINE-MELOXICAM ER 200-6 MG/7ML IJ SOLN
INTRAMUSCULAR | Status: DC | PRN
  Administered 2024-08-13: 400 mg

## 2024-08-13 MED ORDER — POLYETHYLENE GLYCOL 3350 17 G PO PACK
17.0000 g | PACK | Freq: Every day | ORAL | Status: AC
  Administered 2024-08-13: 17 g via ORAL
  Filled 2024-08-13: qty 1

## 2024-08-13 MED ORDER — CEFAZOLIN SODIUM-DEXTROSE 2-4 GM/100ML-% IV SOLN
2.0000 g | Freq: Four times a day (QID) | INTRAVENOUS | Status: AC
  Administered 2024-08-13 (×2): 2 g via INTRAVENOUS
  Filled 2024-08-13 (×2): qty 100

## 2024-08-13 MED ORDER — TRANEXAMIC ACID-NACL 1000-0.7 MG/100ML-% IV SOLN
1000.0000 mg | INTRAVENOUS | Status: AC
  Administered 2024-08-13: 1000 mg via INTRAVENOUS
  Filled 2024-08-13: qty 100

## 2024-08-13 MED ORDER — MENTHOL 3 MG MT LOZG: 1.0000 | LOZENGE | OROMUCOSAL | Status: AC | PRN

## 2024-08-13 MED ORDER — TRANEXAMIC ACID 1000 MG/10ML IV SOLN
INTRAVENOUS | Status: DC | PRN
  Administered 2024-08-13: 2000 mg via TOPICAL

## 2024-08-13 MED ORDER — METHOCARBAMOL 500 MG PO TABS
500.0000 mg | ORAL_TABLET | Freq: Four times a day (QID) | ORAL | Status: AC | PRN
  Administered 2024-08-13: 500 mg via ORAL
  Filled 2024-08-13: qty 1

## 2024-08-13 MED ORDER — INSULIN ASPART 100 UNIT/ML IJ SOLN: 0.0000 [IU] | INTRAMUSCULAR | Status: DC | PRN

## 2024-08-13 MED ORDER — ONDANSETRON HCL 4 MG/2ML IJ SOLN
INTRAMUSCULAR | Status: AC
  Filled 2024-08-13: qty 2

## 2024-08-13 MED ORDER — ONDANSETRON HCL 4 MG/2ML IJ SOLN
INTRAMUSCULAR | Status: DC | PRN
  Administered 2024-08-13: 4 mg via INTRAVENOUS

## 2024-08-13 MED ORDER — BUPIVACAINE IN DEXTROSE 0.75-8.25 % IT SOLN
INTRATHECAL | Status: DC | PRN
  Administered 2024-08-13: 2 mL via INTRATHECAL

## 2024-08-13 MED ORDER — OXYCODONE HCL 5 MG PO TABS
5.0000 mg | ORAL_TABLET | ORAL | Status: AC | PRN
  Administered 2024-08-13 (×3): 5 mg via ORAL
  Filled 2024-08-13 (×2): qty 1

## 2024-08-13 MED ORDER — VANCOMYCIN HCL 1000 MG IV SOLR
INTRAVENOUS | Status: AC
  Filled 2024-08-13: qty 20

## 2024-08-13 MED ORDER — ONDANSETRON HCL 4 MG/2ML IJ SOLN
4.0000 mg | Freq: Four times a day (QID) | INTRAMUSCULAR | Status: AC | PRN
  Administered 2024-08-13: 4 mg via INTRAVENOUS
  Filled 2024-08-13: qty 2

## 2024-08-13 MED ORDER — INSULIN ASPART 100 UNIT/ML IJ SOLN
0.0000 [IU] | Freq: Every day | INTRAMUSCULAR | Status: AC
  Administered 2024-08-13: 2 [IU] via SUBCUTANEOUS
  Filled 2024-08-13: qty 2

## 2024-08-13 MED ORDER — DOXYCYCLINE HYCLATE 100 MG PO TABS: 100.0000 mg | ORAL_TABLET | Freq: Two times a day (BID) | ORAL | Status: AC

## 2024-08-13 MED ORDER — ACETAMINOPHEN 10 MG/ML IV SOLN: 1000.0000 mg | Freq: Once | INTRAVENOUS | Status: DC | PRN

## 2024-08-13 MED ORDER — DIPHENHYDRAMINE HCL 12.5 MG/5ML PO ELIX: 25.0000 mg | ORAL_SOLUTION | ORAL | Status: AC | PRN

## 2024-08-13 MED ORDER — ISOPROPYL ALCOHOL 70 % SOLN
Status: DC | PRN
  Administered 2024-08-13: 1 via TOPICAL

## 2024-08-13 MED ORDER — APIXABAN 2.5 MG PO TABS: 2.5000 mg | ORAL_TABLET | Freq: Two times a day (BID) | ORAL | Status: AC

## 2024-08-13 MED ORDER — PHENOL 1.4 % MT LIQD: 1.0000 | OROMUCOSAL | Status: AC | PRN

## 2024-08-13 MED ORDER — SORBITOL 70 % SOLN: 30.0000 mL | Freq: Every day | Status: AC | PRN

## 2024-08-13 MED ORDER — METHOCARBAMOL 1000 MG/10ML IJ SOLN: 500.0000 mg | Freq: Four times a day (QID) | INTRAMUSCULAR | Status: AC | PRN

## 2024-08-13 MED ORDER — ACETAMINOPHEN 500 MG PO TABS
1000.0000 mg | ORAL_TABLET | Freq: Four times a day (QID) | ORAL | Status: AC
  Administered 2024-08-13 (×3): 1000 mg via ORAL
  Filled 2024-08-13 (×3): qty 2

## 2024-08-13 MED ORDER — MIDAZOLAM HCL 2 MG/2ML IJ SOLN
INTRAMUSCULAR | Status: AC
  Filled 2024-08-13: qty 2

## 2024-08-13 MED ORDER — POVIDONE-IODINE 10 % EX SWAB: 2.0000 | Freq: Once | CUTANEOUS | Status: DC

## 2024-08-13 MED ORDER — ALUM & MAG HYDROXIDE-SIMETH 200-200-20 MG/5ML PO SUSP: 30.0000 mL | ORAL | Status: AC | PRN

## 2024-08-13 MED ORDER — FENTANYL CITRATE (PF) 100 MCG/2ML IJ SOLN
INTRAMUSCULAR | Status: AC
  Filled 2024-08-13: qty 2

## 2024-08-13 MED ORDER — HYDROCHLOROTHIAZIDE 12.5 MG PO TABS
12.5000 mg | ORAL_TABLET | Freq: Every day | ORAL | Status: AC
  Administered 2024-08-13: 12.5 mg via ORAL
  Filled 2024-08-13: qty 1

## 2024-08-13 MED ORDER — VANCOMYCIN HCL 1 G IV SOLR
INTRAVENOUS | Status: DC | PRN
  Administered 2024-08-13: 1000 mg via TOPICAL

## 2024-08-13 MED ORDER — HYDROMORPHONE HCL 1 MG/ML IJ SOLN: 0.5000 mg | INTRAMUSCULAR | Status: AC | PRN

## 2024-08-13 MED ORDER — ORAL CARE MOUTH RINSE: 15.0000 mL | Freq: Once | OROMUCOSAL | Status: AC

## 2024-08-13 MED ORDER — SENNA 8.6 MG PO TABS
1.0000 | ORAL_TABLET | Freq: Every day | ORAL | Status: AC
  Administered 2024-08-13: 8.6 mg via ORAL
  Filled 2024-08-13: qty 1

## 2024-08-13 MED ORDER — DEXAMETHASONE SOD PHOSPHATE PF 10 MG/ML IJ SOLN: 10.0000 mg | Freq: Once | INTRAMUSCULAR | Status: AC

## 2024-08-13 MED ORDER — PHENYLEPHRINE HCL-NACL 20-0.9 MG/250ML-% IV SOLN
INTRAVENOUS | Status: DC | PRN
  Administered 2024-08-13: 35 ug/min via INTRAVENOUS

## 2024-08-13 MED ORDER — SODIUM CHLORIDE 0.9 % IV SOLN: INTRAVENOUS | Status: AC

## 2024-08-13 MED ORDER — STERILE WATER FOR IRRIGATION IR SOLN
Status: DC | PRN
  Administered 2024-08-13: 2000 mL

## 2024-08-13 MED ORDER — OXYCODONE HCL 5 MG/5ML PO SOLN: 5.0000 mg | Freq: Once | ORAL | Status: DC | PRN

## 2024-08-13 MED ORDER — MEPIVACAINE HCL (PF) 2 % IJ SOLN
INTRAMUSCULAR | Status: AC
  Filled 2024-08-13: qty 20

## 2024-08-13 MED ORDER — METOCLOPRAMIDE HCL 5 MG/ML IJ SOLN: 5.0000 mg | Freq: Three times a day (TID) | INTRAMUSCULAR | Status: AC | PRN

## 2024-08-13 NOTE — Anesthesia Procedure Notes (Signed)
 Procedure Name: MAC Date/Time: 08/13/2024 7:28 AM  Performed by: Zulema Leita PARAS, CRNAPre-anesthesia Checklist: Patient identified, Emergency Drugs available, Suction available and Patient being monitored Oxygen Delivery Method: Simple face mask

## 2024-08-13 NOTE — Discharge Instructions (Signed)

## 2024-08-13 NOTE — H&P (Signed)
 "   PREOPERATIVE H&P  Chief Complaint: right hip osteoarthritis  HPI: Margaret Sims is a 64 y.o. female who presents for surgical treatment of right hip osteoarthritis.  She denies any changes in medical history.  Past Surgical History:  Procedure Laterality Date   CESAREAN SECTION     COLONOSCOPY  2014   normal   Social History   Socioeconomic History   Marital status: Married    Spouse name: Not on file   Number of children: Not on file   Years of education: Not on file   Highest education level: 12th grade  Occupational History   Not on file  Tobacco Use   Smoking status: Former    Current packs/day: 0.00    Average packs/day: 0.5 packs/day    Types: Cigarettes    Quit date: 09/05/2021    Years since quitting: 2.9   Smokeless tobacco: Never   Tobacco comments:    11/13/23: Has not had a cigarette in a month.  Vaping Use   Vaping status: Never Used  Substance and Sexual Activity   Alcohol  use: Yes    Comment: occasional   Drug use: Never   Sexual activity: Yes    Birth control/protection: Post-menopausal  Other Topics Concern   Not on file  Social History Narrative   ** Merged History Encounter **       Right handed   Social Drivers of Health   Tobacco Use: Medium Risk (07/05/2024)   Patient History    Smoking Tobacco Use: Former    Smokeless Tobacco Use: Never    Passive Exposure: Not on file  Financial Resource Strain: Low Risk (06/17/2024)   Overall Financial Resource Strain (CARDIA)    Difficulty of Paying Living Expenses: Not hard at all  Food Insecurity: No Food Insecurity (06/17/2024)   Epic    Worried About Radiation Protection Practitioner of Food in the Last Year: Never true    Ran Out of Food in the Last Year: Never true  Transportation Needs: No Transportation Needs (06/17/2024)   Epic    Lack of Transportation (Medical): No    Lack of Transportation (Non-Medical): No  Physical Activity: Inactive (06/17/2024)   Exercise Vital Sign     Days of Exercise per Week: 0 days    Minutes of Exercise per Session: Not on file  Stress: Stress Concern Present (06/17/2024)   [employer] of Occupational Health - Occupational Stress Questionnaire    Feeling of Stress: To some extent  Social Connections: Socially Integrated (06/17/2024)   Social Connection and Isolation Panel    Frequency of Communication with Friends and Family: More than three times a week    Frequency of Social Gatherings with Friends and Family: Twice a week    Attends Religious Services: More than 4 times per year    Active Member of Clubs or Organizations: Yes    Attends Banker Meetings: More than 4 times per year    Marital Status: Married  Depression (PHQ2-9): Low Risk (07/05/2024)   Depression (PHQ2-9)    PHQ-2 Score: 0  Recent Concern: Depression (PHQ2-9) - Medium Risk (04/07/2024)   Depression (PHQ2-9)    PHQ-2 Score: 5  Alcohol  Screen: Low Risk (06/17/2024)   Alcohol  Screen    Last Alcohol  Screening Score (AUDIT): 1  Housing: Low Risk (06/17/2024)   Epic    Unable to Pay for Housing in the Last Year: No    Number of Times Moved in the Last Year: 0  Homeless in the Last Year: No  Utilities: Not on file  Health Literacy: Not on file   Family History  Problem Relation Age of Onset   Stroke Maternal Grandmother    Colon cancer Neg Hx    Colon polyps Neg Hx    Esophageal cancer Neg Hx    Rectal cancer Neg Hx    Stomach cancer Neg Hx    Allergies  Allergen Reactions   Lisinopril  Swelling    Angioedema noted 09/2023. Left side of tongue.   Prior to Admission medications  Medication Sig Start Date End Date Taking? Authorizing Provider  acetaminophen  (TYLENOL ) 500 MG tablet Take 500 mg by mouth every 6 (six) hours as needed for moderate pain (pain score 4-6).   Yes [provider]  amLODipine  (NORVASC ) 10 MG tablet Take 1 tablet (10 mg total) by mouth daily. 10/06/23  Yes Copland, Harlene BROCKS, MD   apixaban  (ELIQUIS ) 5 MG TABS tablet Take 1 tablet (5 mg total) by mouth 2 (two) times daily. 01/02/24  Yes Covington, Sarah M, PA-C  docusate sodium  (COLACE) 100 MG capsule Take 1 capsule (100 mg total) by mouth daily as needed. 08/02/24 08/02/25  Jule Ronal CROME, PA-C  doxycycline  (VIBRAMYCIN ) 100 MG capsule Take 1 capsule (100 mg total) by mouth 2 (two) times daily. To be taken after surgery 08/02/24   Jule Ronal CROME, PA-C  hydrochlorothiazide  (HYDRODIURIL ) 12.5 MG tablet Take 1 tablet (12.5 mg total) by mouth daily. 10/27/23  Yes Copland, Harlene BROCKS, MD  metFORMIN  (GLUCOPHAGE ) 500 MG tablet TAKE 1 TABLET (500 MG TOTAL) BY MOUTH DAILY. Patient taking differently: Take 500 mg by mouth every evening. 03/15/24  Yes Copland, Harlene BROCKS, MD  methocarbamol  (ROBAXIN ) 750 MG tablet Take 1 tablet (750 mg total) by mouth 3 (three) times daily as needed. 08/02/24   Jule Ronal CROME, PA-C  omeprazole  (PRILOSEC) 40 MG capsule Take 1 capsule (40 mg total) by mouth daily. 03/18/24  Yes Copland, Harlene BROCKS, MD  ondansetron  (ZOFRAN ) 4 MG tablet Take 1 tablet (4 mg total) by mouth every 8 (eight) hours as needed for nausea or vomiting. 08/02/24   Jule Ronal CROME, PA-C  ondansetron  (ZOFRAN -ODT) 4 MG disintegrating tablet Take 1 tablet (4 mg total) by mouth every 6 (six) hours as needed for nausea or vomiting. 11/04/23  Yes Prosperi, Christian H, PA-C  oxyCODONE -acetaminophen  (PERCOCET) 5-325 MG tablet Take 1-2 tablets by mouth every 8 (eight) hours as needed. To be taken after surgery 08/02/24   Jule Ronal CROME, PA-C  rosuvastatin  (CRESTOR ) 20 MG tablet Take 20 mg by mouth every evening.   Yes [provider]  Tiotropium Bromide (SPIRIVA  RESPIMAT) 2.5 MCG/ACT AERS Inhale 2 puffs into the lungs daily. Patient taking differently: Inhale 2 puffs into the lungs daily as needed (asthma/SOB). 06/24/24  Yes Copland, Harlene BROCKS, MD     Positive ROS: All other systems have been reviewed and were otherwise negative with the  exception of those mentioned in the HPI and as above.  Physical Exam: General: Alert, no acute distress Cardiovascular: No pedal edema Respiratory: No cyanosis, no use of accessory musculature GI: abdomen soft Skin: No lesions in the area of chief complaint Neurologic: Sensation intact distally Psychiatric: Patient is competent for consent with normal mood and affect Lymphatic: no lymphedema  MUSCULOSKELETAL: exam stable  Assessment: right hip osteoarthritis  Plan: Plan for Procedures: ARTHROPLASTY, HIP, TOTAL, ANTERIOR APPROACH  The risks benefits and alternatives were discussed with the patient including but not limited to  the risks of nonoperative treatment, versus surgical intervention including infection, bleeding, nerve injury,  blood clots, cardiopulmonary complications, morbidity, mortality, among others, and they were willing to proceed.   Ozell Cummins, MD 08/13/2024 5:55 AM  "

## 2024-08-13 NOTE — Anesthesia Procedure Notes (Signed)
 Spinal  Start time: 08/13/2024 7:31 AM End time: 08/13/2024 7:34 AM Reason for block: surgical anesthesia  Staffing Performed: anesthesiologist  Authorized by: Tilford Franky BIRCH, MD   Performed by: Tilford Franky BIRCH, MD  Preanesthetic Checklist Completed: patient identified, IV checked, site marked, risks and benefits discussed, surgical consent, monitors and equipment checked, pre-op evaluation and timeout performed Spinal Block Patient position: sitting Prep: DuraPrep and site prepped and draped Location: L3-4 Injection technique: single-shot Needle Needle type: Pencan  Needle gauge: 24 G Needle length: 10 cm Needle insertion depth (cm): 10  Additional Notes Patient tolerated well. No immediate complications.  Functioning IV was confirmed and monitors were applied. Sterile prep and drape, including hand hygiene and sterile gloves were used. The patient was positioned and the back was prepped. The skin was anesthetized with lidocaine . Free flow of clear CSF was obtained prior to injecting local anesthetic into the CSF. The spinal needle aspirated freely following injection. The needle was carefully withdrawn. The patient tolerated the procedure well.

## 2024-08-13 NOTE — Evaluation (Signed)
 Physical Therapy Evaluation Patient Details Name: Margaret Sims MRN: [id number] DOB: 1960/10/12 Today's Date: 08/13/2024  History of Present Illness  Pt s/p R THR and with hx of DM  Clinical Impression  Pt s/p R THR and presents with decreased R LE strength/ROM and post op pain limiting functional mobility.  Pt limited this date by orthostatic hypotension but able to ambulate short distance in room and sit up in recliner.  Pt should progress to dc home with family assist.        If plan is discharge home, recommend the following: A little help with walking and/or transfers;A little help with bathing/dressing/bathroom;Assistance with cooking/housework;Assist for transportation;Help with stairs or ramp for entrance   Can travel by private vehicle        Equipment Recommendations None recommended by PT  Recommendations for Other Services       Functional Status Assessment Patient has had a recent decline in their functional status and demonstrates the ability to make significant improvements in function in a reasonable and predictable amount of time.     Precautions / Restrictions Precautions Precautions: Fall Restrictions Weight Bearing Restrictions Per Provider Order: Yes RLE Weight Bearing Per Provider Order: Weight bearing as tolerated      Mobility  Bed Mobility Overal bed mobility: Needs Assistance Bed Mobility: Supine to Sit     Supine to sit: Min assist, Mod assist, Used rails     General bed mobility comments: Increased time with cues for sequence and use of L LE ot self assist    Transfers Overall transfer level: Needs assistance Equipment used: Rolling walker (2 wheels) Transfers: Sit to/from Stand Sit to Stand: Min assist, +2 safety/equipment, From elevated surface           General transfer comment: cues for LE management and use of UE to self assist    Ambulation/Gait Ambulation/Gait assistance: Min assist, +2 safety/equipment Gait Distance  (Feet): 3 Feet Assistive device: Rolling walker (2 wheels) Gait Pattern/deviations: Step-to pattern, Decreased step length - left, Decreased step length - right, Shuffle, Trunk flexed Gait velocity: decr     General Gait Details: cues for sequence, posture and position from RW; limited by orthostatic hypotension  Stairs            Wheelchair Mobility     Tilt Bed    Modified Rankin (Stroke Patients Only)       Balance Overall balance assessment: Needs assistance Sitting-balance support: No upper extremity supported, Feet supported Sitting balance-Leahy Scale: Good     Standing balance support: Bilateral upper extremity supported Standing balance-Leahy Scale: Poor                               Pertinent Vitals/Pain Pain Assessment Pain Assessment: 0-10 Pain Score: 4  Pain Location: R hip Pain Descriptors / Indicators: Aching, Sore Pain Intervention(s): Limited activity within patient's tolerance, Monitored during session, Premedicated before session, Ice applied    Home Living Family/patient expects to be discharged to:: Private residence Living Arrangements: Spouse/significant other Available Help at Discharge: Family;Available 24 hours/day Type of Home: House Home Access: Stairs to enter   Entrance Stairs-Number of Steps: 1 Alternate Level Stairs-Number of Steps: 16 Home Layout: Two level Home Equipment: Cane - single point;Rolling Walker (2 wheels)      Prior Function Prior Level of Function : Independent/Modified Independent             Mobility Comments: using  cane for distance       Extremity/Trunk Assessment   Upper Extremity Assessment Upper Extremity Assessment: Overall WFL for tasks assessed    Lower Extremity Assessment Lower Extremity Assessment: RLE deficits/detail    Cervical / Trunk Assessment Cervical / Trunk Assessment: Normal  Communication   Communication Communication: No apparent difficulties     Cognition Arousal: Alert Behavior During Therapy: WFL for tasks assessed/performed   PT - Cognitive impairments: No apparent impairments                         Following commands: Intact       Cueing Cueing Techniques: Verbal cues     General Comments      Exercises Total Joint Exercises Ankle Circles/Pumps: AROM, Both, 15 reps, Supine   Assessment/Plan    PT Assessment Patient needs continued PT services  PT Problem List Decreased strength;Decreased activity tolerance;Decreased range of motion;Decreased balance;Decreased mobility;Decreased knowledge of use of DME;Pain       PT Treatment Interventions DME instruction;Gait training;Stair training;Functional mobility training;Therapeutic activities;Therapeutic exercise;Balance training;Patient/family education    PT Goals (Current goals can be found in the Care Plan section)  Acute Rehab PT Goals Patient Stated Goal: REgain IND PT Goal Formulation: With patient Time For Goal Achievement: 08/20/24 Potential to Achieve Goals: Good    Frequency 7X/week     Co-evaluation               AM-PAC PT 6 Clicks Mobility  Outcome Measure Help needed turning from your back to your side while in a flat bed without using bedrails?: A Lot Help needed moving from lying on your back to sitting on the side of a flat bed without using bedrails?: A Little Help needed moving to and from a bed to a chair (including a wheelchair)?: A Little Help needed standing up from a chair using your arms (e.g., wheelchair or bedside chair)?: A Little Help needed to walk in hospital room?: Total Help needed climbing 3-5 steps with a railing? : Total 6 Click Score: 13    End of Session Equipment Utilized During Treatment: Gait belt Activity Tolerance: Patient tolerated treatment well Patient left: in chair;with call bell/phone within reach;with chair alarm set;with family/visitor present;with nursing/sitter in room Nurse  Communication: Mobility status PT Visit Diagnosis: Difficulty in walking, not elsewhere classified (R26.2)    Time: 8453-8388 PT Time Calculation (min) (ACUTE ONLY): 25 min   Charges:   PT Evaluation $PT Eval Low Complexity: 1 Low PT Treatments $Therapeutic Activity: 8-22 mins PT General Charges $$ ACUTE PT VISIT: 1 Visit         [facility] PT Acute Rehabilitation Services Office [phone]   [facility] 08/13/2024, 4:23 PM

## 2024-08-13 NOTE — Transfer of Care (Signed)
 Immediate Anesthesia Transfer of Care Note  Patient: Margaret Sims  Procedure(s) Performed: ARTHROPLASTY, HIP, TOTAL, ANTERIOR APPROACH (Right: Hip)  Patient Location: PACU  Anesthesia Type:MAC and Spinal  Level of Consciousness: awake, alert , and oriented  Airway & Oxygen Therapy: Face mask, spontaneous respirations    Post-op Assessment: Report given to RN and Post -op Vital signs reviewed and stable  Post vital signs: Reviewed and stable  Last Vitals:  Vitals Value Taken Time  BP 97/71 08/13/24 09:30  Temp    Pulse    Resp 15 08/13/24 09:30  SpO2    Vitals shown include unfiled device data.  Last Pain:  Vitals:   08/13/24 0604  TempSrc: Oral  PainSc:          Complications: No notable events documented.

## 2024-08-13 NOTE — Anesthesia Postprocedure Evaluation (Signed)
"   Anesthesia Post Note  Patient: ETOY MCDONNELL  Procedure(s) Performed: ARTHROPLASTY, HIP, TOTAL, ANTERIOR APPROACH (Right: Hip)     Patient location during evaluation: PACU Anesthesia Type: Spinal Level of consciousness: oriented and awake and alert Pain management: pain level controlled Vital Signs Assessment: post-procedure vital signs reviewed and stable Respiratory status: spontaneous breathing, respiratory function stable and patient connected to nasal cannula oxygen Cardiovascular status: blood pressure returned to baseline and stable Postop Assessment: no headache, no backache, no apparent nausea or vomiting and spinal receding Anesthetic complications: no   No notable events documented.  Last Vitals:  Vitals:   08/13/24 1130 08/13/24 1145  BP: 114/75 129/85  Pulse: 77 77  Resp: 12 16  Temp:  (!) 36.3 C  SpO2: 98% 94%    Last Pain:  Vitals:   08/13/24 1145  TempSrc: Oral  PainSc: 4                  Franky JONETTA Bald      "

## 2024-08-13 NOTE — TOC Transition Note (Signed)
 Transition of Care [facility]) - Discharge Note   Patient Details  Name: Margaret Sims MRN: [id number] Date of Birth: [birth year]  Transition of Care [facility]) CM/SW Contact:  NORMAN ASPEN, LCSW Phone Number: 08/13/2024, 1:10 PM   Clinical Narrative:     Met with pt who confirms she has needed DME in the home.  Pt aware that HHPT prearranged with [facility] prior to surgery.  No further IP CM needs.  Final next level of care: Home w Home Health Services Barriers to Discharge: No Barriers Identified   Patient Goals and CMS Choice Patient states their goals for this hospitalization and ongoing recovery are:: return home          Discharge Placement                       Discharge Plan and Services Additional resources added to the After Visit Summary for                  DME Arranged: N/A DME Agency: NA       HH Arranged: PT HH Agency: [facility]        Social Drivers of Health (SDOH) Interventions SDOH Screenings   Food Insecurity: No Food Insecurity (08/13/2024)  Housing: Low Risk (08/13/2024)  Transportation Needs: No Transportation Needs (08/13/2024)  Utilities: Not At Risk (08/13/2024)  Alcohol  Screen: Low Risk (06/17/2024)  Depression (PHQ2-9): Low Risk (07/05/2024)  Recent Concern: Depression (PHQ2-9) - Medium Risk (04/07/2024)  Financial Resource Strain: Low Risk (06/17/2024)  Physical Activity: Inactive (06/17/2024)  Social Connections: Socially Integrated (08/13/2024)  Stress: Stress Concern Present (06/17/2024)  Tobacco Use: Medium Risk (08/13/2024)     Readmission Risk Interventions     No data to display

## 2024-08-13 NOTE — Plan of Care (Signed)
°  Problem: Education: °Goal: Knowledge of the prescribed therapeutic regimen will improve °Outcome: Progressing °  °Problem: Activity: °Goal: Ability to avoid complications of mobility impairment will improve °Outcome: Progressing °  °Problem: Clinical Measurements: °Goal: Postoperative complications will be avoided or minimized °Outcome: Progressing °  °

## 2024-08-13 NOTE — Op Note (Addendum)
 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 Total hip replacement; Right hip; uncemented [id number]   Surgeon: Kay Cummins, M.D.  Assist: April Green, RNFA   Anesthesia: spinal  Implants: Depuy Acetabulum: Pinnacle 50 mm Femur: Actis 3 STD Head: 43/22 mm size: +7 Liner: +4 neutral Bearing Type: dual mobility  Total Hip Arthroplasty (Anterior Approach) Op Note:  After informed consent was obtained and the operative extremity marked in the holding area, the patient was brought back to the operating room and placed supine on the HANA table. Next, the operative extremity was prepped and draped in normal sterile fashion. Surgical timeout occurred verifying patient identification, surgical site, surgical procedure and administration of antibiotics.  A 10 cm longitudinal incision was made starting from 2 fingerbreadths lateral and inferior to the ASIS towards the lateral aspect of the patella.  A Hueter approach to the hip was performed, using the interval between tensor fascia lata and sartorius.  Dissection was carried bluntly down onto the anterior hip capsule. The lateral femoral circumflex vessels were identified and coagulated. A capsulotomy was performed and the capsular flaps tagged for later repair.  The neck osteotomy was performed 1 fingerbreadth above the lesser trochanter. The femoral head was removed which showed high grade chondral wear, the acetabular rim was cleared of soft tissue and osteophytes and attention was turned to reaming the acetabulum.  Sequential reaming was performed under fluoroscopic guidance down to the floor of the cotyloid fossa. We reamed to a size 50 mm, and then impacted the acetabular shell that matched her bony anatomy. A 25 mm cancellous  screw was placed to secure the shell.  A +4 neutral dual mobility liner was then placed after irrigation and attention turned to the femur.  After placing the femoral hook, the leg was taken to externally rotated, extended and adducted position taking care to perform soft tissue releases to allow for adequate mobilization of the femur. Soft tissue was cleared from the shoulder of the greater trochanter and the hook elevator used to improve exposure of the proximal femur.  Lateral bone from the shoulder was rasped away for relief.  Sequential broaching performed up to a size 3.  Standard trial neck and +4 head were placed. The leg was brought back up to neutral and the construct reduced.  The position and sizing of components, offset and leg lengths were checked using fluoroscopy.  There was a bit of shuck.  Based on fluoroscopic and clinical findings, we chose to retrial with standard neck and +7 head ball.  Stability of the construct was checked in 45 degrees of hip extension and 90 degrees of external rotation without any subluxation, shuck or impingement of prosthesis. We dislocated the prosthesis, dropped the leg back into position, removed trial components, and irrigated copiously. The final stem and head were chosen then placed, the leg brought back up, the system reduced and fluoroscopy used to verify positioning.  Antibiotic irrigation was placed in the surgical wound.   We irrigated, obtained hemostasis and closed the capsule using #2 ethibond suture.  A topical mixture of 0.25% bupivacaine  and meloxicam  was placed deep to the fascia.  One gram of vancomycin  powder was placed in the surgical bed.   One gram of topical tranexamic acid  was injected into the joint.  The fascia was closed with #1 stratafix, the deep fat layer was closed with 0 vicryl, the subcutaneous layers closed with 2.0 Vicryl Plus and the skin closed with 2.0 nylon and dermabond. A sterile dressing was applied. The patient was awakened  in the operating room and taken to recovery in stable condition.  All sponge, needle, and instrument counts were correct at the end of the case.   Position: supine  Complications: see description of procedure.  Time Out: performed   Drains/Packing: none  Estimated blood loss: see anesthesia record  Returned to Recovery Room: in good condition.   Antibiotics: yes   Mechanical VTE (DVT) Prophylaxis: sequential compression devices, TED thigh-high  Chemical VTE (DVT) Prophylaxis: eliquis  POD 1  Fluid Replacement: see anesthesia record  Specimens Removed: 1 to pathology   Sponge and Instrument Count Correct? yes   PACU: portable radiograph - low AP   Plan/RTC: Return in 2 weeks for suture removal. Weight Bearing/Load Lower Extremity: full  Hip precautions: none Suture Removal: 2 weeks   N. Ozell Cummins, MD [facility] 9:04 AM   Implant Name Type Inv. Item Serial No. Manufacturer Lot No. LRB No. Used Action  CUP SECTOR GRIPTON - [id number] Cup CUP SECTOR GRIPTON  [employer] [id number] Right 1 Implanted  LINER DM PINNACLE 50/43 - [id number] Liner LINER DM PINNACLE 50/43  [employer] [id number] Right 1 Implanted  SCREW 6.5MMX25MM - [id number] Screw SCREW 6.5MMX25MM  [employer] [id number] Right 1 Implanted  STEM FEM SZ3 STD ACTIS - [id number] Stem STEM FEM SZ3 STD ACTIS  [employer] [id number] Right 1 Implanted  HEAD FEM STD 22.225X+7 - [id number] Hips HEAD FEM STD 22.225X+7  [employer] [id number] Right 1 Implanted  LINER BM HIP ALTRX 43/22 - [id number] Liner LINER BM HIP ALTRX 43/22  [employer] [id number] Right 1 Implanted   "

## 2024-08-26 ENCOUNTER — Encounter: Admitting: Physician Assistant

## 2024-08-26 ENCOUNTER — Ambulatory Visit: Admitting: Family Medicine

## 2024-08-31 ENCOUNTER — Encounter: Admitting: Physician Assistant

## 2024-09-17 ENCOUNTER — Encounter

## 2024-10-01 ENCOUNTER — Encounter: Admitting: Internal Medicine

## 2025-01-03 ENCOUNTER — Inpatient Hospital Stay: Attending: Medical Oncology

## 2025-01-03 ENCOUNTER — Inpatient Hospital Stay: Admitting: Family
